# Patient Record
Sex: Male | Born: 1950
Health system: Southern US, Community
[De-identification: ages and names within clinical notes are randomized; demographics above are authoritative.]

## PROBLEM LIST (undated history)

## (undated) DIAGNOSIS — I82409 Acute embolism and thrombosis of unspecified deep veins of unspecified lower extremity: Secondary | ICD-10-CM

## (undated) DIAGNOSIS — F17200 Nicotine dependence, unspecified, uncomplicated: Secondary | ICD-10-CM

## (undated) DIAGNOSIS — I219 Acute myocardial infarction, unspecified: Secondary | ICD-10-CM

## (undated) DIAGNOSIS — I714 Abdominal aortic aneurysm, without rupture, unspecified: Secondary | ICD-10-CM

## (undated) DIAGNOSIS — I10 Essential (primary) hypertension: Secondary | ICD-10-CM

## (undated) DIAGNOSIS — I252 Old myocardial infarction: Secondary | ICD-10-CM

## (undated) DIAGNOSIS — E785 Hyperlipidemia, unspecified: Secondary | ICD-10-CM

## (undated) DIAGNOSIS — R0600 Dyspnea, unspecified: Secondary | ICD-10-CM

## (undated) DIAGNOSIS — M5136 Other intervertebral disc degeneration, lumbar region: Secondary | ICD-10-CM

## (undated) DIAGNOSIS — I2699 Other pulmonary embolism without acute cor pulmonale: Secondary | ICD-10-CM

## (undated) DIAGNOSIS — M51369 Other intervertebral disc degeneration, lumbar region without mention of lumbar back pain or lower extremity pain: Secondary | ICD-10-CM

## (undated) HISTORY — DX: Essential (primary) hypertension: I10

## (undated) HISTORY — DX: Acute embolism and thrombosis of unspecified deep veins of unspecified lower extremity: I82.409

## (undated) HISTORY — DX: Hyperlipidemia, unspecified: E78.5

## (undated) HISTORY — DX: Abdominal aortic aneurysm, without rupture, unspecified: I71.40

## (undated) HISTORY — DX: Other pulmonary embolism without acute cor pulmonale: I26.99

## (undated) HISTORY — PX: TONSILLECTOMY: SUR1361

## (undated) HISTORY — PX: CORONARY ANGIOPLASTY WITH STENT PLACEMENT: SHX49

---

## 2006-07-29 ENCOUNTER — Emergency Department (HOSPITAL_COMMUNITY): Admission: EM | Admit: 2006-07-29 | Discharge: 2006-07-29 | Payer: Self-pay | Admitting: Emergency Medicine

## 2006-09-13 ENCOUNTER — Ambulatory Visit (HOSPITAL_COMMUNITY): Admission: RE | Admit: 2006-09-13 | Discharge: 2006-09-13 | Payer: Self-pay | Admitting: Family Medicine

## 2006-09-20 ENCOUNTER — Encounter (HOSPITAL_COMMUNITY): Admission: RE | Admit: 2006-09-20 | Discharge: 2006-10-20 | Payer: Self-pay | Admitting: Family Medicine

## 2006-10-22 ENCOUNTER — Encounter (HOSPITAL_COMMUNITY): Admission: RE | Admit: 2006-10-22 | Discharge: 2006-11-23 | Payer: Self-pay | Admitting: Family Medicine

## 2008-07-13 ENCOUNTER — Emergency Department (HOSPITAL_COMMUNITY): Admission: EM | Admit: 2008-07-13 | Discharge: 2008-07-13 | Payer: Self-pay | Admitting: Emergency Medicine

## 2008-07-27 ENCOUNTER — Emergency Department (HOSPITAL_COMMUNITY): Admission: EM | Admit: 2008-07-27 | Discharge: 2008-07-27 | Payer: Self-pay | Admitting: Emergency Medicine

## 2009-07-12 ENCOUNTER — Inpatient Hospital Stay (HOSPITAL_COMMUNITY): Admission: EM | Admit: 2009-07-12 | Discharge: 2009-07-15 | Payer: Self-pay | Admitting: Emergency Medicine

## 2009-07-13 ENCOUNTER — Encounter (INDEPENDENT_AMBULATORY_CARE_PROVIDER_SITE_OTHER): Payer: Self-pay | Admitting: Cardiology

## 2009-08-09 ENCOUNTER — Encounter (HOSPITAL_COMMUNITY): Admission: RE | Admit: 2009-08-09 | Discharge: 2009-09-08 | Payer: Self-pay | Admitting: Cardiology

## 2009-09-24 ENCOUNTER — Ambulatory Visit (HOSPITAL_COMMUNITY): Admission: RE | Admit: 2009-09-24 | Discharge: 2009-09-24 | Payer: Self-pay | Admitting: Family Medicine

## 2010-02-10 ENCOUNTER — Emergency Department (HOSPITAL_COMMUNITY): Admission: EM | Admit: 2010-02-10 | Discharge: 2010-02-10 | Payer: Self-pay | Admitting: Emergency Medicine

## 2010-10-16 LAB — POCT I-STAT, CHEM 8
Calcium, Ion: 1.1 mmol/L — ABNORMAL LOW (ref 1.12–1.32)
Glucose, Bld: 112 mg/dL — ABNORMAL HIGH (ref 70–99)
HCT: 38 % — ABNORMAL LOW (ref 39.0–52.0)
TCO2: 24 mmol/L (ref 0–100)

## 2010-10-31 LAB — DIFFERENTIAL
Basophils Relative: 0 % (ref 0–1)
Eosinophils Absolute: 0.2 10*3/uL (ref 0.0–0.7)
Eosinophils Relative: 2 % (ref 0–5)
Lymphocytes Relative: 30 % (ref 12–46)
Lymphs Abs: 3 10*3/uL (ref 0.7–4.0)
Monocytes Absolute: 0.8 10*3/uL (ref 0.1–1.0)
Monocytes Relative: 8 % (ref 3–12)
Neutro Abs: 5.7 10*3/uL (ref 1.7–7.7)
Neutrophils Relative %: 59 % (ref 43–77)

## 2010-10-31 LAB — CBC: RDW: 15.8 % — ABNORMAL HIGH (ref 11.5–15.5)

## 2010-10-31 LAB — BASIC METABOLIC PANEL
BUN: 10 mg/dL (ref 6–23)
CO2: 27 mEq/L (ref 19–32)
Chloride: 109 mEq/L (ref 96–112)
Potassium: 4.1 mEq/L (ref 3.5–5.1)
Sodium: 142 mEq/L (ref 135–145)

## 2010-11-01 LAB — DIFFERENTIAL
Basophils Absolute: 0 10*3/uL (ref 0.0–0.1)
Basophils Absolute: 0 10*3/uL (ref 0.0–0.1)
Basophils Relative: 0 % (ref 0–1)
Basophils Relative: 1 % (ref 0–1)
Eosinophils Absolute: 0.1 10*3/uL (ref 0.0–0.7)
Eosinophils Absolute: 0.2 10*3/uL (ref 0.0–0.7)
Eosinophils Relative: 1 % (ref 0–5)
Lymphocytes Relative: 24 % (ref 12–46)
Lymphs Abs: 2.3 10*3/uL (ref 0.7–4.0)
Monocytes Absolute: 0.6 10*3/uL (ref 0.1–1.0)
Monocytes Absolute: 0.7 10*3/uL (ref 0.1–1.0)
Monocytes Relative: 4 % (ref 3–12)
Monocytes Relative: 5 % (ref 3–12)
Neutro Abs: 7.7 10*3/uL (ref 1.7–7.7)
Neutrophils Relative %: 64 % (ref 43–77)

## 2010-11-01 LAB — CBC
HCT: 40.4 % (ref 39.0–52.0)
Hemoglobin: 13.6 g/dL (ref 13.0–17.0)
MCHC: 33.6 g/dL (ref 30.0–36.0)
MCV: 90.9 fL (ref 78.0–100.0)
MCV: 90.9 fL (ref 78.0–100.0)
Platelets: 241 10*3/uL (ref 150–400)
RBC: 4.43 MIL/uL (ref 4.22–5.81)
RBC: 4.44 MIL/uL (ref 4.22–5.81)
RDW: 15.7 % — ABNORMAL HIGH (ref 11.5–15.5)
WBC: 11.4 10*3/uL — ABNORMAL HIGH (ref 4.0–10.5)
WBC: 17.5 10*3/uL — ABNORMAL HIGH (ref 4.0–10.5)

## 2010-11-01 LAB — CK TOTAL AND CKMB (NOT AT ARMC)
CK, MB: 2.6 ng/mL (ref 0.3–4.0)
CK, MB: 23.3 ng/mL — ABNORMAL HIGH (ref 0.3–4.0)
Relative Index: 8.8 — ABNORMAL HIGH (ref 0.0–2.5)
Total CK: 118 U/L (ref 7–232)
Total CK: 264 U/L — ABNORMAL HIGH (ref 7–232)

## 2010-11-01 LAB — POCT CARDIAC MARKERS
Myoglobin, poc: 209 ng/mL (ref 12–200)
Myoglobin, poc: 251 ng/mL (ref 12–200)
Troponin i, poc: 0.08 ng/mL (ref 0.00–0.09)
Troponin i, poc: 1.36 ng/mL (ref 0.00–0.09)

## 2010-11-01 LAB — BASIC METABOLIC PANEL
BUN: 13 mg/dL (ref 6–23)
CO2: 25 mEq/L (ref 19–32)
Calcium: 8.5 mg/dL (ref 8.4–10.5)
Chloride: 108 mEq/L (ref 96–112)
Creatinine, Ser: 1.07 mg/dL (ref 0.4–1.5)
GFR calc Af Amer: >60 mL/min (ref >60)
GFR calc non Af Amer: >60 mL/min (ref >60)
Glucose, Bld: 101 mg/dL — ABNORMAL HIGH (ref 70–99)
Glucose, Bld: 110 mg/dL — ABNORMAL HIGH (ref 70–99)

## 2010-11-01 LAB — POCT I-STAT, CHEM 8
Glucose, Bld: 142 mg/dL — ABNORMAL HIGH (ref 70–99)
Potassium: 3.6 mEq/L (ref 3.5–5.1)
Sodium: 141 mEq/L (ref 135–145)
TCO2: 25 mmol/L (ref 0–100)

## 2010-11-01 LAB — HEPARIN LEVEL (UNFRACTIONATED)
Heparin Unfractionated: 0.56 IU/mL (ref 0.30–0.70)
Heparin Unfractionated: 0.59 IU/mL (ref 0.30–0.70)

## 2010-11-01 LAB — TSH: TSH: 1.821 u[IU]/mL (ref 0.350–4.500)

## 2010-11-01 LAB — CARDIAC PANEL(CRET KIN+CKTOT+MB+TROPI)
CK, MB: 32.7 ng/mL — ABNORMAL HIGH (ref 0.3–4.0)
Relative Index: 8 — ABNORMAL HIGH (ref 0.0–2.5)

## 2010-11-01 LAB — LIPID PANEL
Cholesterol: 127 mg/dL (ref 0–200)
LDL Cholesterol: 72 mg/dL (ref 0–99)
Triglycerides: 98 mg/dL (ref <150)

## 2010-11-01 LAB — HEMOGLOBIN A1C: Hgb A1c MFr Bld: 5.7 % (ref 4.6–6.1)

## 2010-11-01 LAB — TROPONIN I: Troponin I: 1.45 ng/mL (ref 0.00–0.06)

## 2011-09-10 ENCOUNTER — Emergency Department (HOSPITAL_COMMUNITY)
Admission: EM | Admit: 2011-09-10 | Discharge: 2011-09-10 | Disposition: A | Discharge status: Home / Self Care | Payer: Medicaid Other | Attending: Emergency Medicine | Admitting: Emergency Medicine

## 2011-09-10 ENCOUNTER — Encounter (HOSPITAL_COMMUNITY): Payer: Self-pay

## 2011-09-10 ENCOUNTER — Emergency Department (HOSPITAL_COMMUNITY): Payer: Medicaid Other

## 2011-09-10 DIAGNOSIS — M47817 Spondylosis without myelopathy or radiculopathy, lumbosacral region: Secondary | ICD-10-CM | POA: Insufficient documentation

## 2011-09-10 DIAGNOSIS — I252 Old myocardial infarction: Secondary | ICD-10-CM | POA: Insufficient documentation

## 2011-09-10 DIAGNOSIS — M160 Bilateral primary osteoarthritis of hip: Secondary | ICD-10-CM

## 2011-09-10 DIAGNOSIS — M25559 Pain in unspecified hip: Secondary | ICD-10-CM | POA: Insufficient documentation

## 2011-09-10 DIAGNOSIS — M1610 Unilateral primary osteoarthritis, unspecified hip: Secondary | ICD-10-CM | POA: Insufficient documentation

## 2011-09-10 DIAGNOSIS — F17200 Nicotine dependence, unspecified, uncomplicated: Secondary | ICD-10-CM | POA: Insufficient documentation

## 2011-09-10 DIAGNOSIS — M169 Osteoarthritis of hip, unspecified: Secondary | ICD-10-CM | POA: Insufficient documentation

## 2011-09-10 HISTORY — DX: Acute myocardial infarction, unspecified: I21.9

## 2011-09-10 MED ORDER — OXYCODONE-ACETAMINOPHEN 5-325 MG PO TABS
1.0000 | ORAL_TABLET | Freq: Once | ORAL | Status: AC
  Administered 2011-09-10: 1 via ORAL
  Filled 2011-09-10: qty 1

## 2011-09-10 MED ORDER — OXYCODONE HCL 5 MG PO TABS: 5.0000 mg | ORAL_TABLET | Freq: Once | ORAL | Status: DC

## 2011-09-10 MED ORDER — OXYCODONE-ACETAMINOPHEN 5-325 MG PO TABS: 1.0000 | ORAL_TABLET | ORAL | Status: AC | PRN

## 2011-09-10 NOTE — ED Notes (Signed)
Pt presents with left sided hip pain since Thursday. Pt denies injury.

## 2011-09-10 NOTE — ED Notes (Signed)
Pt states his left hip just started hurting like it has in the past. Pt denies injury.

## 2011-09-11 NOTE — ED Provider Notes (Signed)
History     CSN: [id number]  Arrival date & time 09/10/11  1351   First MD Initiated Contact with Patient 09/10/11 1515      Chief Complaint  Patient presents with  . Hip Pain    (Consider location/radiation/quality/duration/timing/severity/associated sxs/prior treatment) Patient is a 61 y.o. male presenting with hip pain. The history is provided by the patient.  Hip Pain This is a recurrent (Patient states he has a known history of arthritis including his lumbar spine and both of his hips. He denies injury) problem. The current episode started in the past 7 days. The problem occurs constantly. The problem has been unchanged. Associated symptoms include arthralgias. Pertinent negatives include no abdominal pain, anorexia, change in bowel habit, chest pain, chills, congestion, fever, headaches, joint swelling, myalgias, nausea, neck pain, numbness, rash, sore throat or weakness. The symptoms are aggravated by walking and standing. He has tried nothing for the symptoms.    Past Medical History  Diagnosis Date  . MI (myocardial infarction)   . Arthritis     History reviewed. No pertinent past surgical history.  No family history on file.  History  Substance Use Topics  . Smoking status: Current Some Day Smoker  . Smokeless tobacco: Not on file  . Alcohol Use: No      Review of Systems  Constitutional: Negative for fever and chills.  HENT: Negative for congestion, sore throat and neck pain.   Eyes: Negative.   Respiratory: Negative for chest tightness and shortness of breath.   Cardiovascular: Negative for chest pain.  Gastrointestinal: Negative for nausea, abdominal pain, anorexia and change in bowel habit.  Genitourinary: Negative.   Musculoskeletal: Positive for arthralgias and gait problem. Negative for myalgias, back pain and joint swelling.  Skin: Negative.  Negative for rash and wound.  Neurological: Negative for dizziness, weakness, light-headedness, numbness and  headaches.  Hematological: Negative.   Psychiatric/Behavioral: Negative.     Allergies  Review of patient's allergies indicates no known allergies.  Home Medications   Current Outpatient Rx  Name Route Sig Dispense Refill  . ASPIRIN 325 MG PO TBEC Oral Take 325 mg by mouth daily.    Marland Kitchen METOPROLOL TARTRATE 25 MG PO TABS Oral Take 25 mg by mouth 2 (two) times daily.    Marland Kitchen NITROGLYCERIN 0.4 MG SL SUBL Sublingual Place 0.4 mg under the tongue every 5 (five) minutes as needed. For chest pains    . QUINAPRIL HCL 10 MG PO TABS Oral Take 10 mg by mouth daily.    Marland Kitchen SIMVASTATIN 20 MG PO TABS Oral Take 20 mg by mouth every evening.    . OXYCODONE-ACETAMINOPHEN 5-325 MG PO TABS Oral Take 1 tablet by mouth every 4 (four) hours as needed for pain. 20 tablet 0    BP 164/91  Pulse 76  Temp(Src) 93.6 F (34.2 C) (Oral)  Resp 20  Ht 5\' 8"  (1.727 m)  Wt 145 lb (65.772 kg)  BMI 22.05 kg/m2  SpO2 97%  Physical Exam  Nursing note and vitals reviewed. Constitutional: He is oriented to person, place, and time. He appears well-developed and well-nourished.  HENT:  Head: Normocephalic and atraumatic.  Eyes: Conjunctivae are normal.  Neck: Normal range of motion.  Cardiovascular: Normal rate, regular rhythm, normal heart sounds and intact distal pulses.   Pulmonary/Chest: Effort normal and breath sounds normal. He has no wheezes.  Abdominal: Soft. Bowel sounds are normal. There is no tenderness.  Musculoskeletal: Normal range of motion. He exhibits no edema and  no tenderness.       Left hip: He exhibits tenderness. He exhibits normal range of motion, no bony tenderness, no crepitus and no deformity.       Increased pain with external rotation of left hip.  Neurological: He is alert and oriented to person, place, and time.  Skin: Skin is warm and dry.  Psychiatric: He has a normal mood and affect.    ED Course  Procedures (including critical care time)  Labs Reviewed - No data to display Dg Hip  Complete Left  09/10/2011  *RADIOLOGY REPORT*  Clinical Data: Left hip pain, no known injury  LEFT HIP - COMPLETE 2+ VIEW  Comparison: None.  Findings: No fracture or dislocation is seen.  Mild symmetric narrowing of the bilateral hip joint spaces.  IMPRESSION: No fracture or dislocation is seen.  Mild degenerative changes of the bilateral hips.  Original Report Authenticated By: Charline Bills, M.D.     1. Hip pain   2. Osteoarthritis of both hips       MDM  Heat therapy recommended, oxycodone prescribed encouraged followup with PCP for further management of his chronic intermittent pain.        Candis Musa, PA 09/11/11 [phone]

## 2011-09-13 NOTE — ED Provider Notes (Signed)
Medical screening examination/treatment/procedure(s) were performed by non-physician practitioner and as supervising physician I was immediately available for consultation/collaboration.   Saquan Furtick, MD 09/13/11 0733 

## 2011-12-13 DIAGNOSIS — R109 Unspecified abdominal pain: Secondary | ICD-10-CM | POA: Insufficient documentation

## 2011-12-13 DIAGNOSIS — I252 Old myocardial infarction: Secondary | ICD-10-CM | POA: Insufficient documentation

## 2011-12-13 DIAGNOSIS — F17200 Nicotine dependence, unspecified, uncomplicated: Secondary | ICD-10-CM | POA: Insufficient documentation

## 2011-12-13 DIAGNOSIS — M549 Dorsalgia, unspecified: Secondary | ICD-10-CM | POA: Insufficient documentation
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
## 2011-12-14 ENCOUNTER — Emergency Department (HOSPITAL_COMMUNITY)
Admission: EM | Admit: 2011-12-14 | Discharge: 2011-12-14 | Disposition: A | Discharge status: Home / Self Care | Payer: Medicaid Other | Attending: Emergency Medicine | Admitting: Emergency Medicine

## 2011-12-14 ENCOUNTER — Encounter (HOSPITAL_COMMUNITY): Payer: Self-pay | Admitting: *Deleted

## 2011-12-14 MED ORDER — HYDROCODONE-ACETAMINOPHEN 5-325 MG PO TABS
1.0000 | ORAL_TABLET | Freq: Once | ORAL | Status: AC
  Administered 2011-12-14: 1 via ORAL
  Filled 2011-12-14: qty 1

## 2011-12-14 MED ORDER — IBUPROFEN 800 MG PO TABS
800.0000 mg | ORAL_TABLET | Freq: Once | ORAL | Status: AC
  Administered 2011-12-14: 800 mg via ORAL
  Filled 2011-12-14: qty 1

## 2011-12-14 NOTE — ED Notes (Addendum)
When giving patient medication he states "Ibuprofen doesn't do anything for me" advised that the MD had also given him generic Vicodin and the two medications should help his pain.

## 2011-12-14 NOTE — ED Notes (Signed)
Requesting pain medication
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
## 2011-12-15 ENCOUNTER — Emergency Department (HOSPITAL_COMMUNITY): Payer: Medicaid Other

## 2011-12-15 ENCOUNTER — Emergency Department (HOSPITAL_COMMUNITY)
Admission: EM | Admit: 2011-12-15 | Discharge: 2011-12-15 | Disposition: A | Discharge status: Home / Self Care | Payer: Medicaid Other | Attending: Emergency Medicine | Admitting: Emergency Medicine

## 2011-12-15 ENCOUNTER — Encounter (HOSPITAL_COMMUNITY): Payer: Self-pay | Admitting: *Deleted

## 2011-12-15 DIAGNOSIS — I723 Aneurysm of iliac artery: Secondary | ICD-10-CM | POA: Insufficient documentation

## 2011-12-15 DIAGNOSIS — M545 Low back pain, unspecified: Secondary | ICD-10-CM | POA: Insufficient documentation

## 2011-12-15 DIAGNOSIS — S20222D Contusion of left back wall of thorax, subsequent encounter: Secondary | ICD-10-CM

## 2011-12-15 DIAGNOSIS — R296 Repeated falls: Secondary | ICD-10-CM | POA: Insufficient documentation

## 2011-12-15 DIAGNOSIS — Z7982 Long term (current) use of aspirin: Secondary | ICD-10-CM | POA: Insufficient documentation

## 2011-12-15 DIAGNOSIS — I252 Old myocardial infarction: Secondary | ICD-10-CM | POA: Insufficient documentation

## 2011-12-15 DIAGNOSIS — M129 Arthropathy, unspecified: Secondary | ICD-10-CM | POA: Insufficient documentation

## 2011-12-15 DIAGNOSIS — Z79899 Other long term (current) drug therapy: Secondary | ICD-10-CM | POA: Insufficient documentation

## 2011-12-15 DIAGNOSIS — IMO0002 Reserved for concepts with insufficient information to code with codable children: Secondary | ICD-10-CM | POA: Insufficient documentation

## 2011-12-15 DIAGNOSIS — S20229A Contusion of unspecified back wall of thorax, initial encounter: Secondary | ICD-10-CM | POA: Insufficient documentation

## 2011-12-15 HISTORY — DX: Other intervertebral disc degeneration, lumbar region: M51.36

## 2011-12-15 HISTORY — DX: Other intervertebral disc degeneration, lumbar region without mention of lumbar back pain or lower extremity pain: M51.369

## 2011-12-15 LAB — POCT I-STAT, CHEM 8
BUN: 10 mg/dL (ref 6–23)
Chloride: 104 mEq/L (ref 96–112)
Sodium: 145 mEq/L (ref 135–145)
TCO2: 28 mmol/L (ref 0–100)

## 2011-12-15 MED ORDER — IOHEXOL 300 MG/ML  SOLN
100.0000 mL | Freq: Once | INTRAMUSCULAR | Status: AC | PRN
  Administered 2011-12-15: 100 mL via INTRAVENOUS

## 2011-12-15 MED ORDER — SODIUM CHLORIDE 0.9 % IV SOLN
INTRAVENOUS | Status: DC
  Administered 2011-12-15: 21:00:00 via INTRAVENOUS

## 2011-12-15 MED ORDER — MORPHINE SULFATE 4 MG/ML IJ SOLN
4.0000 mg | INTRAMUSCULAR | Status: DC | PRN
  Administered 2011-12-15: 4 mg via INTRAVENOUS
  Filled 2011-12-15: qty 1

## 2011-12-15 MED ORDER — OXYCODONE-ACETAMINOPHEN 5-325 MG PO TABS: ORAL_TABLET | ORAL | Status: AC
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
## 2011-12-15 NOTE — Discharge Instructions (Signed)
RESOURCE GUIDE  Dental Problems  Patients with Medicaid: [facility]                     [facility] [address]                                           [address] Phone:  [phone]                                                  Phone:  [phone]  If unable to pay or uninsured, contact:  [facility] or [facility] to become qualified for the adult dental clinic.  Chronic Pain Problems Contact [facility]  [phone] Patients need to be referred by their primary care doctor.  Insufficient Money for Medicine Contact [employer]:  call "211" or [facility] 271-5999.  No Primary Care Doctor Call [facility]  [phone] Other agencies that provide inexpensive medical care    [facility]  [phone]    [facility]  [phone]    [facility]  [phone]    Women's Clinic  [phone]    [facility]  [phone]    [facility]  [phone]  Psychological Services [facility]  [phone] [facility]  [phone] [facility]   [phone] (emergency services 641-4993)  Substance Abuse Resources [facility]  336-882-2125 [facility] 336-784-9470 [facility] 336-285-9073 [facility] 336-845-3988 Residential & Outpatient Substance Abuse Program  800-659-3381  Abuse/Neglect [place] Child Abuse Hotline (336) 641-3795 [place] Child Abuse Hotline 800-378-5315 (After Hours)  Emergency Shelter [facility] (336) 271-5985  Maternity Homes [facility] (336) 275-9566 [facility] (704) 372-4663  MRSA Hotline #:   832-7006    [place] [facility]  [facility] [place]     [employer]                          [facility] [address] [place]                       [address]      [address]  [place]                                                [place]                            [place] Phone:  [phone]                                   Phone:  [phone]                 Phone:  [phone]  [facility] Phone:  [phone]    [facility] Child Abuse Hotline 412 704 0065 514-785-9616 (After Hours)    Take the prescription as directed.  Apply moist heat or ice to the area(s) of discomfort, for 15 minutes at a time, several times per day for the next few days.  Do not fall asleep on a heating or ice pack.  Call your regular medical doctor on Monday to schedule a follow up appointment this week.  Your CT scan showed an incidental finding of:  "Atherosclerotic disease of the infrarenal aorta and mild aneurysmal dilatation of the left common iliac artery up to 1.6 cm."  This is not an acute surgical issue at this time, but needs to be followed up as an outpatient.  Call the Vascular Surgeon's office on Monday to schedule a follow up appointment within the next 1 month.
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
## 2011-12-15 NOTE — ED Notes (Signed)
Pt alert & oriented x4, stable gait. Pt given discharge instructions, paperwork & prescription(s). Patient instructed to stop at the registration desk to finish any additional paperwork. pt verbalized understanding. Pt left department w/ no further questions.  

## 2012-01-03 ENCOUNTER — Other Ambulatory Visit: Payer: Self-pay

## 2012-01-03 DIAGNOSIS — I723 Aneurysm of iliac artery: Secondary | ICD-10-CM

## 2012-02-20 ENCOUNTER — Encounter: Payer: Self-pay | Admitting: Vascular Surgery

## 2012-02-21 ENCOUNTER — Encounter: Payer: Self-pay | Admitting: Vascular Surgery

## 2012-02-21 ENCOUNTER — Other Ambulatory Visit (INDEPENDENT_AMBULATORY_CARE_PROVIDER_SITE_OTHER): Payer: Medicaid Other | Admitting: *Deleted

## 2012-02-21 ENCOUNTER — Ambulatory Visit (INDEPENDENT_AMBULATORY_CARE_PROVIDER_SITE_OTHER): Payer: Medicaid Other | Admitting: Vascular Surgery

## 2012-02-21 VITALS — BP 188/80 | HR 57 | Temp 98.0°F | Ht 68.0 in | Wt 147.0 lb

## 2012-02-21 DIAGNOSIS — I723 Aneurysm of iliac artery: Secondary | ICD-10-CM | POA: Insufficient documentation

## 2012-02-21 NOTE — Assessment & Plan Note (Signed)
This patient has a small 1.6 cm left common iliac artery aneurysm. This was 1.6 cm by CT scan and also by ultrasound. He is thin so I think it will be reasonable to follow this with ultrasound and he should not require CT scan unless it enlarges enough to consider repair. I've explained that we would typically consider elective repair in a normal risk patient if it reached 3.5 cm in maximum diameter. He is a smoker and I have encouraged him to quit as this does increase his risk of aneurysm expansion. I've ordered a follow up ultrasound in one year and we will see him back at that time. He knows to call sooner if he has problems.
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
## 2012-02-28 NOTE — Procedures (Unsigned)
DUPLEX ULTRASOUND OF ABDOMINAL AORTA  INDICATION:  Left common iliac artery aneurysm.  HISTORY: Diabetes:  No Cardiac:  MI Hypertension:  No Smoking:  Yes Connective Tissue Disorder: Family History:  No Previous Surgery:  No  DUPLEX EXAM:         AP (cm)                   TRANSVERSE (cm) Proximal             2.3 cm                    2.2 cm Mid                  2.1 cm                    2.0 cm Distal               2.0 cm                    2.0 cm Right Iliac          1.4 cm                    1.3 cm Left Iliac           1.6 cm                    1.5 cm  PREVIOUS:  Date: 12/15/2011 (CT)  AP:  Left CIA = 1.6  TRANSVERSE:  IMPRESSION: 1. No evidence of aneurysmal dilatation noted in the abdominal aorta. 2. No significant change in maximal diameter of the small left common     iliac artery aneurysm when compared to the previous CT. 3. No increased velocity is noted in the abdominal aorta and bilateral     proximal common iliac arteries.  ___________________________________________ Di Kindle. Edilia Bo, M.D.  CH/MEDQ  D:  02/23/2012  T:  02/23/2012  Job:  [id number]

## 2012-09-24 ENCOUNTER — Inpatient Hospital Stay (HOSPITAL_COMMUNITY)
Admission: EM | Admit: 2012-09-24 | Discharge: 2012-09-26 | DRG: 249 | Disposition: A | Discharge status: Home / Self Care | Payer: Medicaid Other | Attending: Cardiology | Admitting: Cardiology

## 2012-09-24 ENCOUNTER — Emergency Department (HOSPITAL_COMMUNITY): Payer: Medicaid Other

## 2012-09-24 ENCOUNTER — Other Ambulatory Visit: Payer: Self-pay

## 2012-09-24 ENCOUNTER — Encounter (HOSPITAL_COMMUNITY)
Admission: EM | Disposition: A | Discharge status: Home / Self Care | Payer: Self-pay | Source: Home / Self Care | Attending: Cardiology

## 2012-09-24 ENCOUNTER — Encounter (HOSPITAL_COMMUNITY): Payer: Self-pay

## 2012-09-24 ENCOUNTER — Ambulatory Visit (HOSPITAL_COMMUNITY): Admit: 2012-09-24 | Payer: Self-pay | Admitting: Interventional Cardiology

## 2012-09-24 DIAGNOSIS — I10 Essential (primary) hypertension: Secondary | ICD-10-CM | POA: Diagnosis present

## 2012-09-24 DIAGNOSIS — F17200 Nicotine dependence, unspecified, uncomplicated: Secondary | ICD-10-CM | POA: Diagnosis present

## 2012-09-24 DIAGNOSIS — Z9861 Coronary angioplasty status: Secondary | ICD-10-CM

## 2012-09-24 DIAGNOSIS — I2109 ST elevation (STEMI) myocardial infarction involving other coronary artery of anterior wall: Principal | ICD-10-CM | POA: Diagnosis present

## 2012-09-24 DIAGNOSIS — Z7982 Long term (current) use of aspirin: Secondary | ICD-10-CM

## 2012-09-24 DIAGNOSIS — E782 Mixed hyperlipidemia: Secondary | ICD-10-CM

## 2012-09-24 DIAGNOSIS — Z955 Presence of coronary angioplasty implant and graft: Secondary | ICD-10-CM

## 2012-09-24 DIAGNOSIS — I2510 Atherosclerotic heart disease of native coronary artery without angina pectoris: Secondary | ICD-10-CM | POA: Diagnosis present

## 2012-09-24 DIAGNOSIS — I723 Aneurysm of iliac artery: Secondary | ICD-10-CM

## 2012-09-24 DIAGNOSIS — E785 Hyperlipidemia, unspecified: Secondary | ICD-10-CM | POA: Diagnosis present

## 2012-09-24 DIAGNOSIS — Z79899 Other long term (current) drug therapy: Secondary | ICD-10-CM

## 2012-09-24 DIAGNOSIS — I252 Old myocardial infarction: Secondary | ICD-10-CM | POA: Insufficient documentation

## 2012-09-24 HISTORY — PX: LEFT HEART CATHETERIZATION WITH CORONARY ANGIOGRAM: SHX5451

## 2012-09-24 HISTORY — DX: Nicotine dependence, unspecified, uncomplicated: F17.200

## 2012-09-24 HISTORY — DX: Old myocardial infarction: I25.2

## 2012-09-24 LAB — POCT I-STAT, CHEM 8
BUN: 16 mg/dL (ref 6–23)
Calcium, Ion: 1.19 mmol/L (ref 1.13–1.30)
Chloride: 110 mEq/L (ref 96–112)
HCT: 49 % (ref 39.0–52.0)
Sodium: 143 mEq/L (ref 135–145)
TCO2: 24 mmol/L (ref 0–100)

## 2012-09-24 LAB — POCT I-STAT TROPONIN I: Troponin i, poc: 5.66 ng/mL (ref 0.00–0.08)

## 2012-09-24 LAB — CBC
MCH: 28.8 pg (ref 26.0–34.0)
MCHC: 33.5 g/dL (ref 30.0–36.0)
Platelets: 281 10*3/uL (ref 150–400)
RDW: 14.7 % (ref 11.5–15.5)

## 2012-09-24 LAB — COMPREHENSIVE METABOLIC PANEL
ALT: 28 U/L (ref 0–53)
Albumin: 3.6 g/dL (ref 3.5–5.2)
Alkaline Phosphatase: 97 U/L (ref 39–117)
Calcium: 9.8 mg/dL (ref 8.4–10.5)
GFR calc Af Amer: 65 mL/min — ABNORMAL LOW (ref >90)
Potassium: 4.1 mEq/L (ref 3.5–5.1)
Sodium: 141 mEq/L (ref 135–145)
Total Protein: 8.7 g/dL — ABNORMAL HIGH (ref 6.0–8.3)

## 2012-09-24 LAB — POCT ACTIVATED CLOTTING TIME: Activated Clotting Time: 453 seconds

## 2012-09-24 LAB — CK TOTAL AND CKMB (NOT AT ARMC): Relative Index: 4.4 — ABNORMAL HIGH (ref 0.0–2.5)

## 2012-09-24 LAB — APTT: aPTT: 33 seconds (ref 24–37)

## 2012-09-24 LAB — MRSA PCR SCREENING: MRSA by PCR: NEGATIVE

## 2012-09-24 SURGERY — LEFT HEART CATHETERIZATION WITH CORONARY ANGIOGRAM
Anesthesia: LOCAL

## 2012-09-24 MED ORDER — VERAPAMIL HCL 2.5 MG/ML IV SOLN
10.0000 mg | Freq: Once | INTRAVENOUS | Status: DC
  Filled 2012-09-24: qty 4

## 2012-09-24 MED ORDER — NITROGLYCERIN 0.4 MG SL SUBL
0.4000 mg | SUBLINGUAL_TABLET | SUBLINGUAL | Status: DC | PRN
Start: 2012-09-24 — End: 2012-09-26

## 2012-09-24 MED ORDER — NITROGLYCERIN 1 MG/10 ML FOR IR/CATH LAB
INTRA_ARTERIAL | Status: AC
  Filled 2012-09-24: qty 10

## 2012-09-24 MED ORDER — ASPIRIN 81 MG PO CHEW: 324.0000 mg | CHEWABLE_TABLET | Freq: Once | ORAL | Status: DC

## 2012-09-24 MED ORDER — MIDAZOLAM HCL 2 MG/2ML IJ SOLN
INTRAMUSCULAR | Status: AC
  Filled 2012-09-24: qty 2

## 2012-09-24 MED ORDER — HEPARIN (PORCINE) IN NACL 2-0.9 UNIT/ML-% IJ SOLN
INTRAMUSCULAR | Status: AC
  Filled 2012-09-24: qty 1000

## 2012-09-24 MED ORDER — SODIUM CHLORIDE 0.9 % IV SOLN
0.2500 mg/kg/h | INTRAVENOUS | Status: AC
  Filled 2012-09-24: qty 250

## 2012-09-24 MED ORDER — HEPARIN (PORCINE) IN NACL 100-0.45 UNIT/ML-% IJ SOLN
INTRAMUSCULAR | Status: AC
  Administered 2012-09-24: 1000 [IU]
  Filled 2012-09-24: qty 250

## 2012-09-24 MED ORDER — CARVEDILOL 3.125 MG PO TABS
3.1250 mg | ORAL_TABLET | Freq: Two times a day (BID) | ORAL | Status: DC
  Administered 2012-09-24 – 2012-09-25 (×3): 3.125 mg via ORAL
  Filled 2012-09-24 (×6): qty 1

## 2012-09-24 MED ORDER — PRASUGREL HCL 10 MG PO TABS
10.0000 mg | ORAL_TABLET | Freq: Every day | ORAL | Status: DC
  Administered 2012-09-24 – 2012-09-26 (×3): 10 mg via ORAL
  Filled 2012-09-24 (×3): qty 1

## 2012-09-24 MED ORDER — SIMVASTATIN 20 MG PO TABS
20.0000 mg | ORAL_TABLET | Freq: Every day | ORAL | Status: DC
  Administered 2012-09-24 – 2012-09-25 (×2): 20 mg via ORAL
  Filled 2012-09-24 (×3): qty 1

## 2012-09-24 MED ORDER — LIDOCAINE HCL (PF) 1 % IJ SOLN
INTRAMUSCULAR | Status: AC
  Filled 2012-09-24: qty 30

## 2012-09-24 MED ORDER — METOPROLOL TARTRATE 1 MG/ML IV SOLN
INTRAVENOUS | Status: AC
  Administered 2012-09-24: 5 mg
  Filled 2012-09-24: qty 5

## 2012-09-24 MED ORDER — ONDANSETRON HCL 4 MG/2ML IJ SOLN: 4.0000 mg | Freq: Four times a day (QID) | INTRAMUSCULAR | Status: DC | PRN

## 2012-09-24 MED ORDER — HEPARIN SODIUM (PORCINE) 5000 UNIT/ML IJ SOLN: 4000.0000 [IU] | INTRAMUSCULAR | Status: AC

## 2012-09-24 MED ORDER — LISINOPRIL 10 MG PO TABS
10.0000 mg | ORAL_TABLET | Freq: Every day | ORAL | Status: DC
  Administered 2012-09-24 – 2012-09-26 (×3): 10 mg via ORAL
  Filled 2012-09-24 (×3): qty 1

## 2012-09-24 MED ORDER — SODIUM CHLORIDE 0.9 % IV SOLN
INTRAVENOUS | Status: DC
  Administered 2012-09-24: 15:00:00 via INTRAVENOUS

## 2012-09-24 MED ORDER — BIVALIRUDIN 250 MG IV SOLR
INTRAVENOUS | Status: AC
  Filled 2012-09-24: qty 250

## 2012-09-24 MED ORDER — TICAGRELOR 90 MG PO TABS
ORAL_TABLET | ORAL | Status: AC
  Filled 2012-09-24: qty 2

## 2012-09-24 MED ORDER — SODIUM CHLORIDE 0.9 % IV SOLN
1.0000 mL/kg/h | INTRAVENOUS | Status: AC
  Administered 2012-09-24: 1 mL/kg/h via INTRAVENOUS

## 2012-09-24 MED ORDER — OXYCODONE-ACETAMINOPHEN 5-325 MG PO TABS
1.0000 | ORAL_TABLET | Freq: Four times a day (QID) | ORAL | Status: DC | PRN
  Administered 2012-09-24: 1 via ORAL
  Filled 2012-09-24: qty 1

## 2012-09-24 MED ORDER — VERAPAMIL HCL 2.5 MG/ML IV SOLN
INTRAVENOUS | Status: AC
  Filled 2012-09-24: qty 2

## 2012-09-24 MED ORDER — ACETAMINOPHEN 325 MG PO TABS: 650.0000 mg | ORAL_TABLET | ORAL | Status: DC | PRN

## 2012-09-24 MED ORDER — ASPIRIN EC 325 MG PO TBEC
325.0000 mg | DELAYED_RELEASE_TABLET | Freq: Every day | ORAL | Status: DC
  Administered 2012-09-25 – 2012-09-26 (×2): 325 mg via ORAL
  Filled 2012-09-24 (×2): qty 1

## 2012-09-24 MED ORDER — NITROGLYCERIN IN D5W 200-5 MCG/ML-% IV SOLN
INTRAVENOUS | Status: AC
  Administered 2012-09-24: 5 ug
  Filled 2012-09-24: qty 250

## 2012-09-24 MED ORDER — SODIUM CHLORIDE 0.9 % IV SOLN
0.2500 mg/kg/h | INTRAVENOUS | Status: DC
  Filled 2012-09-24: qty 250

## 2012-09-24 MED ORDER — FENTANYL CITRATE 0.05 MG/ML IJ SOLN
INTRAMUSCULAR | Status: AC
  Filled 2012-09-24: qty 2

## 2012-09-24 NOTE — Progress Notes (Signed)
CRITICAL VALUE ALERT  Critical value received:  CKMB 29.9  Date of notification:  09/24/2012  Time of notification:  0800  Critical value read back: Yes  Nurse who received alert:  [place] Antoninette Lerner RN  MD notified (1st page):  Dr. Mayford Knife  Time of first page:  0800  Responding MD: Dr. Mayford Knife  Time MD responded:  [phone]

## 2012-09-24 NOTE — ED Notes (Signed)
Report called to [facility] in [facility] cath lab.

## 2012-09-24 NOTE — CV Procedure (Signed)
PROCEDURE:  Left heart catheterization with selective coronary angiography, PCI LAD.  INDICATIONS:  Anterior ST elevation MI  The risks, benefits, and details of the procedure were explained to the patient.  The patient verbalized understanding and wanted to proceed.  Informed written consent was obtained.  PROCEDURE TECHNIQUE:  After Xylocaine anesthesia a 43F sheath was placed in the right radial artery with a single anterior needle wall stick.   Left coronary angiography was done using a CLS 3.0 guide catheter.  Right coronary angiography was done using a Judkins R4 guide catheter.  Left heart catheterization was done using a pigtail catheter.  TR band was used for hemostasis.   CONTRAST:  Total of 95 cc.  COMPLICATIONS:  None.    HEMODYNAMICS:  Aortic pressure was 154/72; LV pressure was 158/4; LVEDP 15.  There was no gradient between the left ventricle and aorta.    ANGIOGRAPHIC DATA:   The left main coronary artery is widely patent.  The left anterior descending artery is a large vessel which reaches the apex.  In the mid vessel, there is a subtotal occlusion with a large thrombus burden, and TIMI 1 flow.  There appears to be a segment which may be intramyocardial past the subtotal occlusion.  There is several medium-sized diagonals which are widely patent but have some mild ostial disease.  The left circumflex artery is a large vessel.  There is mild atherosclerosis in the midportion of this vessel.  The OM1 is a medium-size vessel and widely patent.  The OM 2 and 3 are medium-sized and widely patent.  The right coronary artery is a large dominant vessel.  The mid vessel stent is widely patent.  The posterior descending artery is a medium-sized vessel and appears widely patent.  The posterior lateral artery is a large vessel .  LEFT VENTRICULOGRAM:  Left ventricular angiogram was not done to avoid excessive contrast administration.  PCI NARRATIVE: A CLS 3.0 guiding catheter was used to  engage the left main.  Angiomax was used for anticoagulation.  An ACT was used to check that the Angiomax is therapeutic.  A pro-water wire was placed across the area of disease.  An expressway aspiration catheter was used with success to remove thrombus an improved flow.  Intracoronary nitroglycerin was used as there appeared to be some spasm in the distal vessel.  It was unclear whether this was myocardial bridging or vasospasm.  A 2.5 x 15 balloon was used to predilate the lesion.  A 3.0 x 20  Veriflex stent was used for the diseased area.  This was deployed at 18 atmospheres.  The stent was postdilated with a 3.75 x 15 McLean apex balloon.  There is an excellent angiographic result.  There is no residual stenosis.  IMPRESSIONS:  1. Normal left main coronary artery. 2. Subtotally occluded mid left anterior descending artery which was responsible for the patient's presentation of anterior STEMI.  This was successfully treated with a bare metal stent, 3.0 x 20, postdilated to 3.75 mm in diameter. 3. Mild atherosclerosis in the left circumflex artery and its branches. 4. Widely patent stent in the mid right coronary artery. 5. LVEDP 15 mmHg.  Ejection fraction not assessed.  RECOMMENDATION:  He'll be watched in the ICU.  Continue dual antiplatelet therapy for at least 30 days but preferably longer.  He'll need aggressive secondary prevention including tobacco cessation.  Start nonselective beta blocker.  Check lipids in the morning.  Continue statin.  Continue ACE inhibitor.  Check  echocardiogram.

## 2012-09-24 NOTE — ED Notes (Signed)
[facility] on site for transfer.

## 2012-09-24 NOTE — ED Notes (Signed)
Pt c/o chest tightness, sob and aching in left arm since yesterday.  Reports pain decreased with nitro.  Reports took a total of 3 or nitro yesterday and took 325mg  aspirin this morning.

## 2012-09-24 NOTE — H&P (Signed)
Admit date: 09/24/2012 Referring Physician Dr. Adriana Simas Primary Cardiologist Dr. Anne Fu Chief complaint/reason for admission: chest pain/ anterior MI  HPI: 62 Young who has had a prior inferior MI.  He had a bare metal stent placed in his RCA.  He has been having chest discomfort intermittently over the past 5 days.  It was most prolonged yesterday.  He went to the ER and was noted to have an abnormal ECG with anterior ST elevation.  He was transferred emergently to [facility] cath lab.  Upon arrival to the cath lab, his chest pain is gone.  However, given his recent symptoms, we will pursue cardiac cath.  He was hemodynamically stable in route.  He continues to smoke cigarettes but is hoping to stop.  He denies any shortness of breath.  No trouble lying flat.  No leg swelling.     PMH:    Past Medical History  Diagnosis Date  . MI (myocardial infarction)   . DDD (degenerative disc disease), lumbar   . Hypertension   . Hyperlipidemia     PSH:    Past Surgical History  Procedure Laterality Date  . Tonsillectomy    . Coronary angioplasty with stent placement      ALLERGIES:   Review of patient's allergies indicates no known allergies.  Prior to Admit Meds:   Prescriptions prior to admission  Medication Sig Dispense Refill  . aspirin 325 MG EC tablet Take 325 mg by mouth daily.      . nitroGLYCERIN (NITROSTAT) 0.4 MG SL tablet Place 0.4 mg under the tongue every 5 (five) minutes as needed. For chest pains      . quinapril (ACCUPRIL) 10 MG tablet Take 10 mg by mouth daily.      . simvastatin (ZOCOR) 20 MG tablet Take 20 mg by mouth every evening.       Family HX:    Family History  Problem Relation Age of Onset  . Cancer Mother    Social HX:    History   Social History  . Marital Status: Single    Spouse Name: N/A    Number of Children: N/A  . Years of Education: N/A   Occupational History  . Not on file.   Social History Main Topics  . Smoking status: Current Some Day Smoker  -- 0.10 packs/day for 44 years    Types: Cigarettes  . Smokeless tobacco: Never Used  . Alcohol Use: No  . Drug Use: Yes     Comment: cocaine use in the past, pt states it has been about 2 years since he has used/  denies drug use.  Marland Kitchen Sexually Active: Not on file   Other Topics Concern  . Not on file   Social History Narrative  . No narrative on file     ROS:  All 11 ROS were addressed and are negative except what is stated in the HPI  PHYSICAL EXAM Filed Vitals:   09/24/12 1440  BP: 164/103  Pulse:   Resp: 16   General: Well developed, well nourished, in no acute distress Head:  Normal cephalic and atramatic  Lungs:   Clear bilaterally to auscultation and percussion. Heart:   HRRR S1 S2  Abdomen:  abdomen soft and non-tender Msk:  . Normal strength and tone for age. Extremities:   No  edema.  2+ right radial pulse Neuro: Alert and oriented X 3. Psych:  Normal affect, responds appropriately   Labs:   Lab Results  Component Value  Date   WBC 13.0* 09/24/2012   HGB 16.7 09/24/2012   HCT 49.0 09/24/2012   MCV 85.7 09/24/2012   PLT 281 09/24/2012    Recent Labs Lab 09/24/12 1421 09/24/12 1447  NA 141 143  K 4.1 4.0  CL 104 110  CO2 27  --   BUN 15 16  CREATININE 1.31 1.30  CALCIUM 9.8  --   PROT 8.7*  --   BILITOT 0.2*  --   ALKPHOS 97  --   ALT 28  --   AST 65*  --   GLUCOSE 93 93   Lab Results  Component Value Date   CKTOTAL 408* 07/13/2009   CKMB 32.7* 07/13/2009   TROPONINI  Value: 7.56        POSSIBLE MYOCARDIAL ISCHEMIA. SERIAL TESTING RECOMMENDED. CRITICAL VALUE NOTED.  VALUE IS CONSISTENT WITH PREVIOUSLY REPORTED AND CALLED VALUE.* 07/13/2009   No results found for this basename: PTT   Lab Results  Component Value Date   INR 1.00 09/24/2012   INR 1.03 07/12/2009     Lab Results  Component Value Date   CHOL  Value: 127        ATP III CLASSIFICATION:  <200     mg/dL   Desirable  409-811  mg/dL   Borderline High  >=914    mg/dL   High         78/29/5621   Lab Results  Component Value Date   HDL 35* 07/13/2009   Lab Results  Component Value Date   LDLCALC  Value: 72        Total Cholesterol/HDL:CHD Risk Coronary Heart Disease Risk Table                     Men   Women  1/2 Average Risk   3.4   3.3  Average Risk       5.0   4.4  2 X Average Risk   9.6   7.1  3 X Average Risk  23.4   11.0        Use the calculated Patient Ratio above and the CHD Risk Table to determine the patient's CHD Risk.        ATP III CLASSIFICATION (LDL):  <100     mg/dL   Optimal  308-657  mg/dL   Near or Above                    Optimal  130-159  mg/dL   Borderline  846-962  mg/dL   High  >952     mg/dL   Very High 84/13/2440   Lab Results  Component Value Date   TRIG 98 07/13/2009   Lab Results  Component Value Date   CHOLHDL 3.6 07/13/2009   No results found for this basename: LDLDIRECT      Radiology:  @RISRSLT24 @  EKG:  Normal sinus rhythm with anterior ST elevation  ASSESSMENT: Acute anterior wall MI, tobacco abuse, prior inferior wall MI, history of drug abuse, history of noncompliance  PLAN:  He likely has an LAD occlusion or a subtotal occlusion.  The fact that his pain has gone away may mean that he has reestablished flow.  Further plans will be based on the results of the cardiac cath.  We'll likely plan on bare metal stent to avoid the need for prolonged dual antiplatelet therapy.  In looking back at the record, he has not seen Dr. Anne Fu since 2011.  He'll  need to stop smoking.  He is already on an ACE inhibitor and statin.  He says he takes these medicines regularly.  Will add nonselective beta blocker given history of cocaine use.  He'll likely be watched in the ICU.  Corky Crafts., MD  09/24/2012  4:35 PM

## 2012-09-24 NOTE — ED Provider Notes (Addendum)
History     This chart was scribed for Donnetta Hutching, MD, MD by Smitty Pluck, ED Scribe. The patient was seen in room APA19/APA19 and the patient's care was started at 2:16 PM.   CSN: [id number]  Arrival date & time 09/24/12  1408    Chief Complaint  Patient presents with  . Chest Pain    The history is provided by the patient and medical records. No language interpreter was used.   Ruben Young is a 62 y.o. male with hx of MI (2010), HTN and DDD who presents to the Emergency Department complaining of constant, moderate substernal chest pain radiating to left arm onset 1 day ago in the morning. He describes the pain as tightness. He states that he has moderate SOB. He has taken 4 nitro yesterday and 325 mg of asa today with minor relief. Movement aggravates the chest pain and SOB. Pt denies fever, chills, nausea, vomiting, diarrhea, weakness in extremities, numbness in extremities, cough, SOB and any other pain.  Pt reports hx of boils. He states he smokes cigarettes.   PCP is Dr. Loleta Chance Cardiologist is Stames   Past Medical History  Diagnosis Date  . MI (myocardial infarction)   . DDD (degenerative disc disease), lumbar   . Hypertension   . Hyperlipidemia     Past Surgical History  Procedure Laterality Date  . Tonsillectomy      Family History  Problem Relation Age of Onset  . Cancer Mother     History  Substance Use Topics  . Smoking status: Current Some Day Smoker -- 0.10 packs/day for 44 years    Types: Cigarettes  . Smokeless tobacco: Never Used  . Alcohol Use: No      Review of Systems 10 Systems reviewed and all are negative for acute change except as noted in the HPI.    Allergies  Review of patient's allergies indicates no known allergies.  Home Medications   Current Outpatient Rx  Name  Route  Sig  Dispense  Refill  . aspirin 325 MG EC tablet   Oral   Take 325 mg by mouth daily.         . nitroGLYCERIN (NITROSTAT) 0.4 MG SL tablet    Sublingual   Place 0.4 mg under the tongue every 5 (five) minutes as needed. For chest pains         . quinapril (ACCUPRIL) 10 MG tablet   Oral   Take 10 mg by mouth daily.         . simvastatin (ZOCOR) 20 MG tablet   Oral   Take 20 mg by mouth every evening.           BP 177/102  Pulse 65  Resp 18  Ht 5\' 8"  (1.727 m)  Wt 152 lb (68.947 kg)  BMI 23.12 kg/m2  SpO2 98%  Physical Exam  Nursing note and vitals reviewed. Constitutional: He is oriented to person, place, and time. He appears well-developed and well-nourished.  HENT:  Head: Normocephalic and atraumatic.  Eyes: Conjunctivae and EOM are normal. Pupils are equal, round, and reactive to light.  Neck: Normal range of motion. Neck supple.  Cardiovascular: Normal rate, regular rhythm and normal heart sounds.   HR is 63   Pulmonary/Chest: Effort normal and breath sounds normal.  Abdominal: Soft. Bowel sounds are normal.  Musculoskeletal: Normal range of motion.  Neurological: He is alert and oriented to person, place, and time.  Skin: Skin is warm and dry.  Psychiatric: He has a normal mood and affect.    ED Course  Procedures (including critical care time) DIAGNOSTIC STUDIES: Oxygen Saturation is 98% on [place], normal by my interpretation.    COORDINATION OF CARE: 2:20 PM Discussed ED treatment with pt and pt agrees.  2:33 PM Consult: Discussed pt treatment plan with Dr. Eldridge Dace (Cardiologist) and he recommends giving Heparin, lopressor, Nitro. Pt is awaiting EMS transport to [facility].    CRITICAL CARE Performed by: Lorna Few MD    Total critical care time: 30  Critical care time was exclusive of separately billable procedures and treating other patients.  Critical care was necessary to treat or prevent imminent or life-threatening deterioration.  Critical care was time spent personally by me on the following activities: development of treatment plan with patient and/or surrogate as well as nursing,  discussions with consultants, evaluation of patient's response to treatment, examination of patient, obtaining history from patient or surrogate, ordering and performing treatments and interventions, ordering and review of laboratory studies, ordering and review of radiographic studies, pulse oximetry and re-evaluation of patient's condition.    Labs Reviewed - No data to display No results found.   No diagnosis found.   Date: 09/24/2012  Rate: 68  Rhythm: normal sinus rhythm  QRS Axis: normal  Intervals: QT prolonged  ST/T Wave abnormalities: ST elevation anteriorly and laterally. T wave inversion inferiorly  Conduction Disutrbances:right bundle branch block  Narrative Interpretation:   Old EKG Reviewed: changes noted   MDM  Chest tightness for 24 hours. Previous MI approximately 4 years ago. Cardiac risk factors include smoking, hypertension, hypercholesterolemia. EKG shows ST elevation anterior laterally and T wave inversion inferiorly.  Rx IV heparin, IV nitroglycerin, IV Lopressor, by mouth aspirin taken earlier in the day. Discussed with Dr.Varanasi.  He will accept patient in transfer [facility].  Patient is hemodynamically stable      I personally performed the services described in this documentation, which was scribed in my presence. The recorded information has been reviewed and is accurate.    Donnetta Hutching, MD 09/24/12 1442  Donnetta Hutching, MD 09/24/12 [phone]

## 2012-09-25 DIAGNOSIS — I2109 ST elevation (STEMI) myocardial infarction involving other coronary artery of anterior wall: Secondary | ICD-10-CM | POA: Diagnosis present

## 2012-09-25 LAB — BASIC METABOLIC PANEL
CO2: 24 mEq/L (ref 19–32)
Calcium: 8.9 mg/dL (ref 8.4–10.5)
Chloride: 107 mEq/L (ref 96–112)
Creatinine, Ser: 1.27 mg/dL (ref 0.50–1.35)
Glucose, Bld: 91 mg/dL (ref 70–99)

## 2012-09-25 LAB — CK TOTAL AND CKMB (NOT AT ARMC)
CK, MB: 15.3 ng/mL (ref 0.3–4.0)
Relative Index: 3.8 — ABNORMAL HIGH (ref 0.0–2.5)
Total CK: 439 U/L — ABNORMAL HIGH (ref 7–232)

## 2012-09-25 LAB — LIPID PANEL
HDL: 34 mg/dL — ABNORMAL LOW (ref >39)
LDL Cholesterol: 81 mg/dL (ref 0–99)
Total CHOL/HDL Ratio: 3.9 RATIO
Triglycerides: 91 mg/dL (ref <150)

## 2012-09-25 LAB — CBC
Hemoglobin: 12.8 g/dL — ABNORMAL LOW (ref 13.0–17.0)
MCH: 28.6 pg (ref 26.0–34.0)
MCV: 84.6 fL (ref 78.0–100.0)
Platelets: 248 10*3/uL (ref 150–400)
RBC: 4.47 MIL/uL (ref 4.22–5.81)
WBC: 10 10*3/uL (ref 4.0–10.5)

## 2012-09-25 MED FILL — Dextrose Inj 5%: INTRAVENOUS | Qty: 50 | Status: AC

## 2012-09-25 NOTE — Progress Notes (Signed)
Pt transferred to room 3W36 via wheelchair by RN. Pt' s belongings, chart, phone, and meds taken to new room. Report given to receiving nurse, Ceasar. Patient denies any complaints at this time.   Dawson Bills, RN

## 2012-09-25 NOTE — Progress Notes (Signed)
Subjective:  Overall feels well, no chest pain, no shortness of breath.  Objective:  Vital Signs in the last 24 hours: Temp:  [97.8 F (36.6 C)-98.2 F (36.8 C)] 98.1 F (36.7 C) (02/26 0733) Pulse Rate:  [52-78] 64 (02/26 0733) Resp:  [13-20] 14 (02/26 0733) BP: (91-179)/(47-112) 132/70 mmHg (02/26 0700) SpO2:  [98 %-100 %] 100 % (02/26 0733) Weight:  [66.2 kg (145 lb 15.1 oz)-68.947 kg (152 lb)] 66.2 kg (145 lb 15.1 oz) (02/25 1700)  Intake/Output from previous day: 02/25 0701 - 02/26 0700 In: 493.4 [I.V.:493.4] Out: 925 [Urine:925]   Physical Exam: General: Well developed, well nourished, in no acute distress. Head:  Normocephalic and atraumatic. Lungs: Clear to auscultation and percussion. Heart: Normal S1 and S2.  No murmur, rubs or gallops.  Abdomen: soft, non-tender, positive bowel sounds. Extremities: No clubbing or cyanosis. No edema. Catheterization site intact, radial artery intact Neurologic: Alert and oriented x 3.    Lab Results:  Recent Labs  09/24/12 1421 09/24/12 1447 09/25/12 0600  WBC 13.0*  --  10.0  HGB 16.1 16.7 12.8*  PLT 281  --  248    Recent Labs  09/24/12 1421 09/24/12 1447 09/25/12 0600  NA 141 143 140  K 4.1 4.0 3.9  CL 104 110 107  CO2 27  --  24  GLUCOSE 93 93 91  BUN 15 16 13   CREATININE 1.31 1.30 1.27   No results found for this basename: TROPONINI, CK, MB,  in the last 72 hours Hepatic Function Panel  Recent Labs  09/24/12 1421  PROT 8.7*  ALBUMIN 3.6  AST 65*  ALT 28  ALKPHOS 97  BILITOT 0.2*    Recent Labs  09/25/12 0600  CHOL 133   No results found for this basename: PROTIME,  in the last 72 hours  Imaging: Dg Chest Portable 1 View  09/24/2012  *RADIOLOGY REPORT*  Clinical Data: Chest pain.  PORTABLE CHEST - 1 VIEW  Comparison: Two-view chest 07/12/2009.  Findings: The heart size is normal.  Lungs are clear.  The visualized soft tissues and bony thorax are unremarkable.  IMPRESSION: Negative one-view  chest.   Original Report Authenticated By: Marin Roberts, M.D.    Personally viewed.   Telemetry: Persistent T-wave inversion. No adverse arrhythmias, no VT. Personally viewed.   EKG:  Same ST segment changes as seen on the initial EKG in the anterior leads. Possible anterior wall aneurysm.  Cardiac Studies:  LAD bare-metal stent placed. RCA previously placed bare-metal stent patent. Awaiting echocardiogram.  Assessment/Plan:  62 year old with ST elevation myocardial infarction, prior cocaine use, hypertension.  1. ST elevation myocardial infarction-bare-metal stent to LAD. Dual antiplatelet therapy. Chest pain-free currently. Continue to titrate medications. Utilizing nonselective beta blockers as tolerated given prior cocaine use. ACE inhibitor as tolerated. Statin. Aspirin, dual antiplatelet therapy. Elevated MB of 29. Trending down currently.  2. Persistently abnormal EKG-await echocardiogram. Probably aneurysmal segment of anterior wall.  3. Transfer to floor.  4. Hyperlipidemia-statin therapy  5. Coronary artery disease-as described above.  Cardiac rehabilitation.    Ruben Young 09/25/2012, 8:41 AM

## 2012-09-25 NOTE — Care Management Note (Addendum)
    Page 1 of 1   09/26/2012     10:11:43 AM   CARE MANAGEMENT NOTE 09/26/2012  Patient:  Ruben Young, Ruben Young   Account Number:  [id number]  Date Initiated:  09/25/2012  Documentation initiated by:  Junius Creamer  Subjective/Objective Assessment:   adm w mi     Action/Plan:   lives alone, pcp dr gerald hill   Anticipated DC Date:     Anticipated DC Plan:        DC Planning Services  CM consult      Choice offered to / List presented to:             Status of service:  Completed, signed off Medicare Important Message given?   (If response is "NO", the following Medicare IM given date fields will be blank) Date Medicare IM given:   Date Additional Medicare IM given:    Discharge Disposition:  HOME/SELF CARE  Per UR Regulation:  Reviewed for med. necessity/level of care/duration of stay  If discussed at Long Length of Stay Meetings, dates discussed:    Comments:  09-26-12 [address] [place] [phone] CM did call [facility] in [place] and medication is available. Pt has 30 day free card and Rx.   2/26 0931 debbie dowell rn,bsn left pt effient 30day free and copay assist card. pt has medicaid ins.

## 2012-09-25 NOTE — Progress Notes (Signed)
CARDIAC REHAB PHASE I   PRE:  Rate/Rhythm: 71SR  BP:  Supine: 135/67  Sitting:   Standing:    SaO2: 97%RA  MODE:  Ambulation: 700 ft   POST:  Rate/Rhythem: 76SR  BP:  Supine:   Sitting: 150/73  Standing:    SaO2: 99%RA 1000-1050 Pt walked 700 ft with steady gait. Tolerated well. Denied CP. To recliner after walk. Education completed except diet. Left sheet for heart healthy diet and will review tomorrow. Discussed smoking cessation and gave handouts. Encouraged pt to call 1800QUITNOW. Pt asked about e-cig and I told him could not recommend due to not FDA approved and have to be careful with what you put in. May get more nicotine. Pt states he goes days sometimes without smoking and then it is usually just a few. Discussed CRP 2. Pt gave permission to refer to [place].  Duanne Limerick

## 2012-09-25 NOTE — Progress Notes (Signed)
Chaplain visited Cath lab immediately after receiving [employer] pager message. Pt was in Lab #5 during the entire procedure. Chaplain met and settled two sisters of pt in consult room #4. Chaplain listened empathically to family, served as Print production planner between [employer] and family members. Pt was sent to 2901 after procedure. Chaplain provided ministry of presence until family could visit with pt. Chaplain will follow-up on pt and family in the AM.Family thanked Chaplain for his presence and support. Kelle Darting [phone]

## 2012-09-26 MED ORDER — CARVEDILOL 6.25 MG PO TABS
6.2500 mg | ORAL_TABLET | ORAL | Status: AC
  Administered 2012-09-26: 6.25 mg via ORAL
  Filled 2012-09-26: qty 1

## 2012-09-26 MED ORDER — CARVEDILOL 6.25 MG PO TABS: 6.2500 mg | ORAL_TABLET | Freq: Two times a day (BID) | ORAL | Status: DC

## 2012-09-26 MED ORDER — PRASUGREL HCL 10 MG PO TABS: 10.0000 mg | ORAL_TABLET | Freq: Every day | ORAL | Status: DC

## 2012-09-26 MED ORDER — ASPIRIN 81 MG PO TBEC: 325.0000 mg | DELAYED_RELEASE_TABLET | Freq: Every day | ORAL | Status: DC

## 2012-09-26 MED ORDER — CARVEDILOL 6.25 MG PO TABS
6.2500 mg | ORAL_TABLET | Freq: Two times a day (BID) | ORAL | Status: DC
  Filled 2012-09-26 (×2): qty 1

## 2012-09-26 NOTE — Discharge Summary (Signed)
Patient ID: Ruben Young MRN: [id number] DOB/AGE: 08/13/1950 62 y.o.  Admit date: 09/24/2012 Discharge date: 09/26/2012  Primary Discharge Diagnosis: Anterior STEMI Secondary Discharge Diagnosis: HTN, Tobacco use, CAD (BMS to LAD)  Significant Diagnostic Studies: CATH: PROCEDURE: Left heart catheterization with selective coronary angiography, PCI LAD.  INDICATIONS: Anterior ST elevation MI  The risks, benefits, and details of the procedure were explained to the patient. The patient verbalized understanding and wanted to proceed. Informed written consent was obtained.  PROCEDURE TECHNIQUE: After Xylocaine anesthesia a 16F sheath was placed in the right radial artery with a single anterior needle wall stick. Left coronary angiography was done using a CLS 3.0 guide catheter. Right coronary angiography was done using a Judkins R4 guide catheter. Left heart catheterization was done using a pigtail catheter. TR band was used for hemostasis.  CONTRAST: Total of 95 cc.  COMPLICATIONS: None.  HEMODYNAMICS: Aortic pressure was 154/72; LV pressure was 158/4; LVEDP 15. There was no gradient between the left ventricle and aorta.  ANGIOGRAPHIC DATA: The left main coronary artery is widely patent.  The left anterior descending artery is a large vessel which reaches the apex. In the mid vessel, there is a subtotal occlusion with a large thrombus burden, and TIMI 1 flow. There appears to be a segment which may be intramyocardial past the subtotal occlusion. There is several medium-sized diagonals which are widely patent but have some mild ostial disease.  The left circumflex artery is a large vessel. There is mild atherosclerosis in the midportion of this vessel. The OM1 is a medium-size vessel and widely patent. The OM 2 and 3 are medium-sized and widely patent.  The right coronary artery is a large dominant vessel. The mid vessel stent is widely patent. The posterior descending artery is a medium-sized  vessel and appears widely patent. The posterior lateral artery is a large vessel .  LEFT VENTRICULOGRAM: Left ventricular angiogram was not done to avoid excessive contrast administration.  PCI NARRATIVE: A CLS 3.0 guiding catheter was used to engage the left main. Angiomax was used for anticoagulation. An ACT was used to check that the Angiomax is therapeutic. A pro-water wire was placed across the area of disease. An expressway aspiration catheter was used with success to remove thrombus an improved flow. Intracoronary nitroglycerin was used as there appeared to be some spasm in the distal vessel. It was unclear whether this was myocardial bridging or vasospasm. A 2.5 x 15 balloon was used to predilate the lesion. A 3.0 x 20 Veriflex stent was used for the diseased area. This was deployed at 18 atmospheres. The stent was postdilated with a 3.75 x 15 Catarina apex balloon. There is an excellent angiographic result. There is no residual stenosis.  IMPRESSIONS:  1. Normal left main coronary artery. 2. Subtotally occluded mid left anterior descending artery which was responsible for the patient's presentation of anterior STEMI. This was successfully treated with a bare metal stent, 3.0 x 20, postdilated to 3.75 mm in diameter. 3. Mild atherosclerosis in the left circumflex artery and its branches. 4. Widely patent stent in the mid right coronary artery. 5. LVEDP 15 mmHg. Ejection fraction not assessed. RECOMMENDATION: He'll be watched in the ICU. Continue dual antiplatelet therapy for at least 30 days but preferably longer. He'll need aggressive secondary prevention including tobacco cessation. Start nonselective beta blocker. Check lipids in the morning. Continue statin. Continue ACE inhibitor. Check echocardiogram.   Hospital Course: 62 year old with prior history of BMS to RCA who came in  with late presenting anterior ST elevation myocardial infarction and was taken to the cardiac catheterization lab where a  BMS was placed to the LAD, 3.0 x 20 mm. His ejection fraction by echocardiogram was approximally 40%. Mid to distal anteroseptal wall was dyskinetic. He has remote history of cocaine use. Tobacco history. Tobacco cessation counseling. Cardiac rehabilitation walk patient during hospitalization, no chest pain. Overall he is feeling well, ready to go home. Carvedilol, nonselective beta blocker has been initiated and uptitrate to 6.25 mg twice a day. He is also on ACE inhibitor. Hemoglobin 12.8 on discharge. This is decreased from admission recorded at 16.1 however he has no clear signs of bleeding.  The importance of dual antiplatelet therapy have been expressed to him. With bare-metal stent, he needs at least one month of dual antiplatelet therapy. We have given him prescription.  Discharge Exam: Blood pressure 136/77, pulse 60, temperature 98.1 F (36.7 C), temperature source Oral, resp. rate 18, height 5\' 8"  (1.727 m), weight 66.815 kg (147 lb 4.8 oz), SpO2 100.00%.   General appearance: alert and cooperative Cardiovascular: Regular rate and rhythm, no significant murmurs, no JVD Lungs clear to auscultation Abdomen soft nontender Catheterization site clean dry and intact.  Labs:   Lab Results  Component Value Date   WBC 10.0 09/25/2012   HGB 12.8* 09/25/2012   HCT 37.8* 09/25/2012   MCV 84.6 09/25/2012   PLT 248 09/25/2012    Recent Labs Lab 09/24/12 1421  09/25/12 0600  NA 141  < > 140  K 4.1  < > 3.9  CL 104  < > 107  CO2 27  --  24  BUN 15  < > 13  CREATININE 1.31  < > 1.27  CALCIUM 9.8  --  8.9  PROT 8.7*  --   --   BILITOT 0.2*  --   --   ALKPHOS 97  --   --   ALT 28  --   --   AST 65*  --   --   GLUCOSE 93  < > 91  < > = values in this interval not displayed. Lab Results  Component Value Date   CKTOTAL 439* 09/25/2012   CKMB 15.3* 09/25/2012   TROPONINI  Value: 7.56        POSSIBLE MYOCARDIAL ISCHEMIA. SERIAL TESTING RECOMMENDED. CRITICAL VALUE NOTED.  VALUE IS CONSISTENT  WITH PREVIOUSLY REPORTED AND CALLED VALUE.* 07/13/2009    Lab Results  Component Value Date   CHOL 133 09/25/2012   CHOL  Value: 127        ATP III CLASSIFICATION:  <200     mg/dL   Desirable  161-096  mg/dL   Borderline High  >=045    mg/dL   High        40/98/1191   Lab Results  Component Value Date   HDL 34* 09/25/2012   HDL 35* 07/13/2009   Lab Results  Component Value Date   LDLCALC 81 09/25/2012   LDLCALC  Value: 72        Total Cholesterol/HDL:CHD Risk Coronary Heart Disease Risk Table                     Men   Women  1/2 Average Risk   3.4   3.3  Average Risk       5.0   4.4  2 X Average Risk   9.6   7.1  3 X Average Risk  23.4   11.0  Use the calculated Patient Ratio above and the CHD Risk Table to determine the patient's CHD Risk.        ATP III CLASSIFICATION (LDL):  <100     mg/dL   Optimal  562-130  mg/dL   Near or Above                    Optimal  130-159  mg/dL   Borderline  865-784  mg/dL   High  >696     mg/dL   Very High 29/52/8413   Lab Results  Component Value Date   TRIG 91 09/25/2012   TRIG 98 07/13/2009   Lab Results  Component Value Date   CHOLHDL 3.9 09/25/2012   CHOLHDL 3.6 07/13/2009   No results found for this basename: LDLDIRECT       EKG: Persistent ST segment elevation and biphasic T waves in the precordial leads.  FOLLOW UP PLANS AND APPOINTMENTS Discharge Orders   Future Appointments Provider Department Dept Phone   02/20/2013 8:30 AM [facility] [phone]   Eat a light meal the night before the exam but please avoid gaseous foods.   Nothing to eat or drink for at least 8 hours prior to the exam. No gum chewing or smoking the morning of the exam. Please take your morning medications with small sips of water, especially blood pressure medication. If you have several vascular lab exams and will see physician, please bring a snack with you.   02/20/2013 9:40 AM Evern Bio, NP [facility] [phone]   Future Orders Complete By Expires     Amb Referral to Cardiac Rehabilitation  As directed     Diet - low sodium heart healthy  As directed     Increase activity slowly  As directed         Medication List    STOP taking these medications       metoprolol tartrate 25 MG tablet  Commonly known as:  LOPRESSOR      TAKE these medications       aspirin 81 MG EC tablet  Take 4 tablets (325 mg total) by mouth daily.     carvedilol 6.25 MG tablet  Commonly known as:  COREG  Take 1 tablet (6.25 mg total) by mouth 2 (two) times daily with a meal.     nitroGLYCERIN 0.4 MG SL tablet  Commonly known as:  NITROSTAT  Place 0.4 mg under the tongue every 5 (five) minutes as needed. For chest pains     prasugrel 10 MG Tabs  Commonly known as:  EFFIENT  Take 1 tablet (10 mg total) by mouth daily.     quinapril 10 MG tablet  Commonly known as:  ACCUPRIL  Take 10 mg by mouth daily.     simvastatin 20 MG tablet  Commonly known as:  ZOCOR  Take 20 mg by mouth every evening.           Follow-up Information   Follow up with Donato Schultz, MD On 10/04/2012. (2:15pm)    Contact information:   [address] [place] [address] [phone]       BRING ALL MEDICATIONS WITH YOU TO FOLLOW UP APPOINTMENTS  Time spent with patient to include physician time: 35 minutes spent with patient, medicine reconciliation, review of medical records. Education. SignedDonato Schultz 09/26/2012, 8:19 AM

## 2012-09-26 NOTE — Progress Notes (Addendum)
D/c orders received;IV removed with gauze on, pt remains in stable condition, pt meds and instructions reviewed and given to pt; reviewed with pt importance of taking meds as prescribed; pt d/c to home; spoke with Dr.Skains and clarified with MD that he wanted pt to take 81mg  of ASA at home; pt made aware

## 2012-09-26 NOTE — Progress Notes (Signed)
0940-1000 Cardiac Rehab Completed discharge education with pt. Discussed heart healthy diet. He voices understanding. Answered pt's questions.

## 2013-02-19 ENCOUNTER — Other Ambulatory Visit: Payer: Self-pay | Admitting: *Deleted

## 2013-02-19 DIAGNOSIS — I724 Aneurysm of artery of lower extremity: Secondary | ICD-10-CM

## 2013-02-20 ENCOUNTER — Other Ambulatory Visit (INDEPENDENT_AMBULATORY_CARE_PROVIDER_SITE_OTHER): Payer: Medicaid Other

## 2013-02-20 ENCOUNTER — Ambulatory Visit: Payer: Medicaid Other | Admitting: Neurosurgery

## 2013-02-20 DIAGNOSIS — I723 Aneurysm of iliac artery: Secondary | ICD-10-CM

## 2013-02-20 DIAGNOSIS — I724 Aneurysm of artery of lower extremity: Secondary | ICD-10-CM

## 2013-02-21 ENCOUNTER — Other Ambulatory Visit: Payer: Self-pay | Admitting: *Deleted

## 2013-02-21 DIAGNOSIS — I723 Aneurysm of iliac artery: Secondary | ICD-10-CM

## 2013-02-27 ENCOUNTER — Encounter: Payer: Self-pay | Admitting: Vascular Surgery

## 2013-07-09 ENCOUNTER — Ambulatory Visit: Payer: Medicaid Other | Admitting: Cardiology

## 2013-08-05 ENCOUNTER — Encounter: Payer: Self-pay | Admitting: Cardiology

## 2013-08-05 ENCOUNTER — Ambulatory Visit (INDEPENDENT_AMBULATORY_CARE_PROVIDER_SITE_OTHER): Payer: Medicaid Other | Admitting: Cardiology

## 2013-08-05 VITALS — BP 180/101 | HR 68 | Ht 68.0 in | Wt 156.0 lb

## 2013-08-05 DIAGNOSIS — Z79899 Other long term (current) drug therapy: Secondary | ICD-10-CM

## 2013-08-05 DIAGNOSIS — I2510 Atherosclerotic heart disease of native coronary artery without angina pectoris: Secondary | ICD-10-CM

## 2013-08-05 DIAGNOSIS — I2589 Other forms of chronic ischemic heart disease: Secondary | ICD-10-CM

## 2013-08-05 DIAGNOSIS — I252 Old myocardial infarction: Secondary | ICD-10-CM

## 2013-08-05 DIAGNOSIS — I255 Ischemic cardiomyopathy: Secondary | ICD-10-CM

## 2013-08-05 DIAGNOSIS — I10 Essential (primary) hypertension: Secondary | ICD-10-CM

## 2013-08-05 MED ORDER — METOPROLOL SUCCINATE ER 50 MG PO TB24: 50.0000 mg | ORAL_TABLET | Freq: Every day | ORAL | Status: DC

## 2013-08-05 MED ORDER — LOSARTAN POTASSIUM 100 MG PO TABS: 100.0000 mg | ORAL_TABLET | Freq: Every day | ORAL | Status: DC

## 2013-08-05 NOTE — Patient Instructions (Addendum)
Your physician has recommended you make the following change in your medication:   Stop: 1. Carvedilol 2. Effient 3. Quinapril  Start: 1. Metoprolol ER 50mg  once daily 2. Losartan 100mg  once daily  Your physician recommends that you schedule a follow-up appointment in: 2 weeks with PA (Blood Pressure).

## 2013-08-05 NOTE — Progress Notes (Signed)
[facility]. [address] [place], [place]  [address] Phone: [phone] Fax:  [phone]  Date:  08/05/2013   ID:  Ruben Young, DOB 07/17/1951, MRN [id number]  PCP:  Maggie Font, MD   History of Present Illness: Ruben Young is a 63 y.o. male with prior anterior wall myocardial infarction on 09/24/12 with bare-metal stent to LAD and previously placed RCA stent, EF 40% here for followup   Wt Readings from Last 3 Encounters:  08/05/13 156 lb (70.761 kg)  09/26/12 147 lb 4.8 oz (66.815 kg)  09/26/12 147 lb 4.8 oz (66.815 kg)     Past Medical History  Diagnosis Date  . MI (myocardial infarction)   . DDD (degenerative disc disease), lumbar   . Hypertension   . Hyperlipidemia   . Old myocardial infarction   . Tobacco use disorder     Past Surgical History  Procedure Laterality Date  . Tonsillectomy    . Coronary angioplasty with stent placement      Current Outpatient Prescriptions  Medication Sig Dispense Refill  . aspirin 81 MG EC tablet Take 4 tablets (325 mg total) by mouth daily.  30 tablet  12  . carvedilol (COREG) 6.25 MG tablet Take 1 tablet (6.25 mg total) by mouth 2 (two) times daily with a meal.  60 tablet  12  . clopidogrel (PLAVIX) 75 MG tablet Take 75 mg by mouth daily with breakfast.      . LOSARTAN POTASSIUM PO Take 100 mg by mouth.      . nitroGLYCERIN (NITROSTAT) 0.4 MG SL tablet Place 0.4 mg under the tongue every 5 (five) minutes as needed. For chest pains      . prasugrel (EFFIENT) 10 MG TABS Take 1 tablet (10 mg total) by mouth daily.  30 tablet  12  . quinapril (ACCUPRIL) 10 MG tablet Take 10 mg by mouth daily.      . simvastatin (ZOCOR) 20 MG tablet Take 20 mg by mouth every evening.       No current facility-administered medications for this visit.    Allergies:   No Known Allergies  Social History:  The patient  reports that he has been smoking Cigarettes.  He has a 4.4 pack-year smoking history. He has never used  smokeless tobacco. He reports that he uses illicit drugs. He reports that he does not drink alcohol.   ROS:  Please see the history of present illness.   No CP, no bleeding.     PHYSICAL EXAM: VS:  BP 180/101  Pulse 68  Ht 5\' 8"  (1.727 m)  Wt 156 lb (70.761 kg)  BMI 23.73 kg/m2 Well nourished, well developed, in no acute distress HEENT: normal Neck: no JVD Cardiac:  normal S1, S2; RRR; no murmur Lungs:  clear to auscultation bilaterally, no wheezing, rhonchi or rales Abd: soft, nontender, no hepatomegaly Ext: no edema Skin: warm and dry Neuro: no focal abnormalities noted  EKG:  Sinus rhythm rate 68, no other significant abnormalities.     ASSESSMENT AND PLAN:  1. Old myocardial infarction-anterior ST elevation myocardial infarction on 09/24/12. PCI to LAD, bare-metal stent. Previously widely patent stent in the mid right coronary artery. Ejection fraction at that time 40%. Mid to distal anteroseptal wall akinetic/dyskinetic. 2. CAD-LAD stent in the setting of anterior wall myocardial infarction, previous RCA stent widely patent. Continuing with both Plavix as well as aspirin. 3. Ischemic cardiomyopathy-ejection fraction 40% previously. NYHA class 2. 4. Hypertension-currently  uncontrolled. He has not had his medications. We'll go ahead and prescribe him metoprolol extended release 50 mg once a day and losartan 100 mg once a day. 5. Return to clinic in 2 weeks for blood pressure evaluation visit.  Signed, Candee Furbish, MD [facility]  08/05/2013 4:13 PM

## 2013-08-19 ENCOUNTER — Ambulatory Visit: Payer: Medicaid Other | Admitting: Physician Assistant

## 2013-08-31 ENCOUNTER — Other Ambulatory Visit: Payer: Self-pay | Admitting: *Deleted

## 2013-08-31 DIAGNOSIS — E7800 Pure hypercholesterolemia, unspecified: Secondary | ICD-10-CM

## 2013-09-16 ENCOUNTER — Other Ambulatory Visit: Payer: Self-pay | Admitting: Vascular Surgery

## 2013-09-16 DIAGNOSIS — I723 Aneurysm of iliac artery: Secondary | ICD-10-CM

## 2013-10-08 ENCOUNTER — Other Ambulatory Visit: Payer: Self-pay

## 2013-10-08 MED ORDER — LOSARTAN POTASSIUM 100 MG PO TABS: 100.0000 mg | ORAL_TABLET | Freq: Every day | ORAL | Status: DC

## 2013-10-08 MED ORDER — METOPROLOL SUCCINATE ER 50 MG PO TB24: 50.0000 mg | ORAL_TABLET | Freq: Every day | ORAL | Status: DC

## 2013-10-12 ENCOUNTER — Emergency Department (HOSPITAL_COMMUNITY): Payer: Medicaid Other

## 2013-10-12 ENCOUNTER — Encounter (HOSPITAL_COMMUNITY): Payer: Self-pay | Admitting: Emergency Medicine

## 2013-10-12 ENCOUNTER — Emergency Department (HOSPITAL_COMMUNITY)
Admission: EM | Admit: 2013-10-12 | Discharge: 2013-10-12 | Disposition: A | Discharge status: Home / Self Care | Payer: Medicaid Other | Attending: Emergency Medicine | Admitting: Emergency Medicine

## 2013-10-12 DIAGNOSIS — Z9861 Coronary angioplasty status: Secondary | ICD-10-CM | POA: Insufficient documentation

## 2013-10-12 DIAGNOSIS — M549 Dorsalgia, unspecified: Secondary | ICD-10-CM | POA: Insufficient documentation

## 2013-10-12 DIAGNOSIS — F17200 Nicotine dependence, unspecified, uncomplicated: Secondary | ICD-10-CM | POA: Insufficient documentation

## 2013-10-12 DIAGNOSIS — Z7902 Long term (current) use of antithrombotics/antiplatelets: Secondary | ICD-10-CM | POA: Insufficient documentation

## 2013-10-12 DIAGNOSIS — L03211 Cellulitis of face: Secondary | ICD-10-CM | POA: Insufficient documentation

## 2013-10-12 DIAGNOSIS — I252 Old myocardial infarction: Secondary | ICD-10-CM | POA: Insufficient documentation

## 2013-10-12 DIAGNOSIS — Z862 Personal history of diseases of the blood and blood-forming organs and certain disorders involving the immune mechanism: Secondary | ICD-10-CM | POA: Insufficient documentation

## 2013-10-12 DIAGNOSIS — Z8739 Personal history of other diseases of the musculoskeletal system and connective tissue: Secondary | ICD-10-CM | POA: Insufficient documentation

## 2013-10-12 DIAGNOSIS — Z8639 Personal history of other endocrine, nutritional and metabolic disease: Secondary | ICD-10-CM | POA: Insufficient documentation

## 2013-10-12 DIAGNOSIS — Z7982 Long term (current) use of aspirin: Secondary | ICD-10-CM | POA: Insufficient documentation

## 2013-10-12 DIAGNOSIS — I10 Essential (primary) hypertension: Secondary | ICD-10-CM | POA: Insufficient documentation

## 2013-10-12 DIAGNOSIS — L0201 Cutaneous abscess of face: Secondary | ICD-10-CM | POA: Insufficient documentation

## 2013-10-12 DIAGNOSIS — Z79899 Other long term (current) drug therapy: Secondary | ICD-10-CM | POA: Insufficient documentation

## 2013-10-12 LAB — BASIC METABOLIC PANEL
BUN: 10 mg/dL (ref 6–23)
CALCIUM: 9.3 mg/dL (ref 8.4–10.5)
CO2: 25 meq/L (ref 19–32)
CREATININE: 1.18 mg/dL (ref 0.50–1.35)
Chloride: 102 mEq/L (ref 96–112)
GFR calc Af Amer: 74 mL/min — ABNORMAL LOW (ref >90)
GFR calc non Af Amer: 63 mL/min — ABNORMAL LOW (ref >90)
GLUCOSE: 102 mg/dL — AB (ref 70–99)
Potassium: 3.8 mEq/L (ref 3.7–5.3)
Sodium: 139 mEq/L (ref 137–147)

## 2013-10-12 MED ORDER — IOHEXOL 300 MG/ML  SOLN
75.0000 mL | Freq: Once | INTRAMUSCULAR | Status: AC | PRN
  Administered 2013-10-12: 75 mL via INTRAVENOUS

## 2013-10-12 MED ORDER — DOXYCYCLINE HYCLATE 100 MG PO CAPS: 100.0000 mg | ORAL_CAPSULE | Freq: Two times a day (BID) | ORAL | Status: AC

## 2013-10-12 MED ORDER — VANCOMYCIN HCL IN DEXTROSE 1-5 GM/200ML-% IV SOLN
1000.0000 mg | Freq: Once | INTRAVENOUS | Status: AC
  Administered 2013-10-12: 1000 mg via INTRAVENOUS
  Filled 2013-10-12: qty 200

## 2013-10-12 MED ORDER — HYDROCODONE-ACETAMINOPHEN 5-325 MG PO TABS: 1.0000 | ORAL_TABLET | ORAL | Status: DC | PRN

## 2013-10-12 MED ORDER — DIPHENHYDRAMINE HCL 50 MG/ML IJ SOLN
25.0000 mg | Freq: Once | INTRAMUSCULAR | Status: AC
  Administered 2013-10-12: 25 mg via INTRAVENOUS
  Filled 2013-10-12: qty 1

## 2013-10-12 MED ORDER — AMOXICILLIN-POT CLAVULANATE 875-125 MG PO TABS: 1.0000 | ORAL_TABLET | Freq: Two times a day (BID) | ORAL | Status: DC

## 2013-10-12 NOTE — ED Provider Notes (Signed)
CSN: [id number]     Arrival date & time 10/12/13  1202 History   First MD Initiated Contact with Patient 10/12/13 1335     Chief Complaint  Patient presents with  . Facial Swelling     (Consider location/radiation/quality/duration/timing/severity/associated sxs/prior Treatment) HPI Comments: Patient is a 63 year old male who presents to the emergency department with a complaint of facial swelling. The patient states that approximately 3-4 days ago he began to notice some itching on the side of his right face. Shortly after that he noticed redness and then today she noticed swelling from his temporal area 2 under his right eye. The patient states he has only minimal on pain. He denies any vision changes. He denies any unusual headache. He denies chills or high temperature elevations. He has not had any operations or procedures involving the face or eye. He denies history of diabetes. He has not taken anything for this up to this point.  The history is provided by the patient.    Past Medical History  Diagnosis Date  . MI (myocardial infarction)   . DDD (degenerative disc disease), lumbar   . Hypertension   . Hyperlipidemia   . Old myocardial infarction   . Tobacco use disorder    Past Surgical History  Procedure Laterality Date  . Tonsillectomy    . Coronary angioplasty with stent placement     Family History  Problem Relation Age of Onset  . Cancer Mother    History  Substance Use Topics  . Smoking status: Current Some Day Smoker -- 0.10 packs/day for 44 years    Types: Cigarettes  . Smokeless tobacco: Never Used  . Alcohol Use: No    Review of Systems  Constitutional: Negative for activity change.       All ROS Neg except as noted in HPI  HENT: Positive for facial swelling. Negative for nosebleeds.   Eyes: Negative for photophobia and discharge.  Respiratory: Negative for cough, shortness of breath and wheezing.   Cardiovascular: Negative for chest pain and  palpitations.  Gastrointestinal: Negative for abdominal pain and blood in stool.  Genitourinary: Negative for dysuria, frequency and hematuria.  Musculoskeletal: Positive for back pain. Negative for arthralgias and neck pain.  Skin: Negative.   Neurological: Negative for dizziness, seizures and speech difficulty.  Psychiatric/Behavioral: Negative for hallucinations and confusion.      Allergies  Review of patient's allergies indicates no known allergies.  Home Medications   Current Outpatient Rx  Name  Route  Sig  Dispense  Refill  . aspirin 81 MG EC tablet   Oral   Take 4 tablets (325 mg total) by mouth daily.   30 tablet   12   . carvedilol (COREG) 6.25 MG tablet   Oral   Take 6.25 mg by mouth 2 (two) times daily with a meal.         . clopidogrel (PLAVIX) 75 MG tablet   Oral   Take 75 mg by mouth daily with breakfast.         . losartan (COZAAR) 100 MG tablet   Oral   Take 1 tablet (100 mg total) by mouth daily.   30 tablet   3   . metoprolol succinate (TOPROL-XL) 50 MG 24 hr tablet   Oral   Take 1 tablet (50 mg total) by mouth daily. Take with or immediately following a meal.   30 tablet   3   . nitroGLYCERIN (NITROSTAT) 0.4 MG SL tablet   Sublingual  Place 0.4 mg under the tongue every 5 (five) minutes as needed. For chest pains          BP 166/87  Temp(Src) 98.4 F (36.9 C) (Oral)  Resp 18  Ht 5\' 8"  (1.727 m)  Wt 158 lb (71.668 kg)  BMI 24.03 kg/m2  SpO2 96% Physical Exam  Nursing note and vitals reviewed. Constitutional: He is oriented to person, place, and time. He appears well-developed and well-nourished.  Non-toxic appearance.  HENT:  Head: Normocephalic.  Right Ear: Tympanic membrane and external ear normal.  Left Ear: Tympanic membrane and external ear normal.  There is swelling and redness in the preauricular area extending to the temporal. Down to the right cheek, and then under the right eye. There is actual a blister type  formation under the right eye. The area is warm but not hot. Minimal tenderness present.  There is no mastoid tenderness or redness present. The tympanic membranes are pearly gray without pathology bilaterally.  Eyes: Conjunctivae, EOM and lids are normal. Pupils are equal, round, and reactive to light. Right eye exhibits no discharge. Left eye exhibits no discharge. No scleral icterus.  Neck: Normal range of motion. Neck supple. Carotid bruit is not present.  Palpable submental nodes present with tenderness to palpation.  Cardiovascular: Normal rate, regular rhythm, normal heart sounds, intact distal pulses and normal pulses.   Pulmonary/Chest: Breath sounds normal. No respiratory distress.  Abdominal: Soft. Bowel sounds are normal. There is no tenderness. There is no guarding.  Musculoskeletal: Normal range of motion.  Lymphadenopathy:       Head (right side): No submandibular adenopathy present.       Head (left side): No submandibular adenopathy present.    He has no cervical adenopathy.  Neurological: He is alert and oriented to person, place, and time. He has normal strength. No cranial nerve deficit or sensory deficit.  Skin: Skin is warm and dry.  Psychiatric: He has a normal mood and affect. His speech is normal.    ED Course  Procedures (including critical care time) Labs Review Labs Reviewed  BASIC METABOLIC PANEL - Abnormal; Notable for the following:    Glucose, Bld 102 (*)    GFR calc non Af Amer 63 (*)    GFR calc Af Amer 74 (*)    All other components within normal limits   Imaging Review No results found.   EKG Interpretation None      MDM Vital signs are well within normal limits.  Basic metabolic panel is well within normal limits. The macula filtration rate is 74.  Contrasted CT maxillofacial study reveals extensive subcutaneous soft tissue swelling over the right side most compatible with cellulitis. There is no evidence for abscess.  The patient was  given a gram of vancomycin. The plan will be for the patient to be placed on doxycycline and Augmentin. Patient will be reevaluated on Tuesday, March 17. Patient advised to return sooner if any changes, problems, or concerns.  At discharge the patient remembered that he had died his beard recently. He had areas of skin irritation and itching, and it was shortly after this that he began to have the facial swelling and redness. Suspect that the patient had a break in the skin in the area of his beard that allowed for infection.    Final diagnoses:  None    **I have reviewed nursing notes, vital signs, and all appropriate lab and imaging results for this patient.Evorn Gong  Marshell Levan, PA-C 10/12/13 [place], PA-C 10/12/13 1728

## 2013-10-12 NOTE — ED Provider Notes (Signed)
Medical screening examination/treatment/procedure(s) were performed by non-physician practitioner and as supervising physician I was immediately available for consultation/collaboration.  Carmin Muskrat, MD 10/12/13 [phone]

## 2013-10-12 NOTE — ED Notes (Signed)
Pt transferred to room 15. Report given to Constance Holster, RN

## 2013-10-12 NOTE — ED Notes (Signed)
Pt c/o facial swelling x 3 days. Swelling and redness to right eye noted.

## 2013-10-12 NOTE — Discharge Instructions (Signed)
Examination and CT scans are consistent with cellulitis of the face Cellulitis Cellulitis is an infection of the skin and the tissue beneath it. The infected area is usually red and tender. Cellulitis occurs most often in the arms and lower legs.  CAUSES  Cellulitis is caused by bacteria that enter the skin through cracks or cuts in the skin. The most common types of bacteria that cause cellulitis are Staphylococcus and Streptococcus. SYMPTOMS   Redness and warmth.  Swelling.  Tenderness or pain.  Fever. DIAGNOSIS  Your caregiver can usually determine what is wrong based on a physical exam. Blood tests may also be done. TREATMENT  Treatment usually involves taking an antibiotic medicine. HOME CARE INSTRUCTIONS   Take your antibiotics as directed. Finish them even if you start to feel better.  Keep the infected arm or leg elevated to reduce swelling.  Apply a warm cloth to the affected area up to 4 times per day to relieve pain.  Only take over-the-counter or prescription medicines for pain, discomfort, or fever as directed by your caregiver.  Keep all follow-up appointments as directed by your caregiver. SEEK MEDICAL CARE IF:   You notice red streaks coming from the infected area.  Your red area gets larger or turns dark in color.  Your bone or joint underneath the infected area becomes painful after the skin has healed.  Your infection returns in the same area or another area.  You notice a swollen bump in the infected area.  You develop new symptoms. SEEK IMMEDIATE MEDICAL CARE IF:   You have a fever.  You feel very sleepy.  You develop vomiting or diarrhea.  You have a general ill feeling (malaise) with muscle aches and pains. MAKE SURE YOU:   Understand these instructions.  Will watch your condition.  Will get help right away if you are not doing well or get worse. Document Released: 04/26/2005 Document Revised: 01/16/2012 Document Reviewed:  10/02/2011 [facility] Patient Information 2014 [place]. Marland Kitchen You were treated in the emergency department with vancomycin. Please use doxycycline and Augmentin daily, please take these with food until all taken. Use Tylenol or ibuprofen for mild pain, use Norco for more severe pain. Norco may cause drowsiness, please use with caution. Please return to the emergency department if you have high fever, or the infection seems to be expanding.

## 2013-10-22 ENCOUNTER — Other Ambulatory Visit: Payer: Medicaid Other

## 2013-11-14 ENCOUNTER — Encounter: Payer: Self-pay | Admitting: Cardiology

## 2013-11-14 ENCOUNTER — Ambulatory Visit (INDEPENDENT_AMBULATORY_CARE_PROVIDER_SITE_OTHER): Payer: Medicaid Other | Admitting: Cardiology

## 2013-11-14 ENCOUNTER — Other Ambulatory Visit: Payer: Medicaid Other

## 2013-11-14 VITALS — BP 138/100 | HR 60 | Ht 68.0 in | Wt 161.8 lb

## 2013-11-14 DIAGNOSIS — I2510 Atherosclerotic heart disease of native coronary artery without angina pectoris: Secondary | ICD-10-CM

## 2013-11-14 DIAGNOSIS — F17200 Nicotine dependence, unspecified, uncomplicated: Secondary | ICD-10-CM

## 2013-11-14 DIAGNOSIS — I252 Old myocardial infarction: Secondary | ICD-10-CM

## 2013-11-14 DIAGNOSIS — I2589 Other forms of chronic ischemic heart disease: Secondary | ICD-10-CM

## 2013-11-14 DIAGNOSIS — I10 Essential (primary) hypertension: Secondary | ICD-10-CM

## 2013-11-14 DIAGNOSIS — I255 Ischemic cardiomyopathy: Secondary | ICD-10-CM

## 2013-11-14 MED ORDER — CARVEDILOL 6.25 MG PO TABS
6.2500 mg | ORAL_TABLET | Freq: Two times a day (BID) | ORAL | Status: DC
Start: 2013-11-14 — End: 2014-05-26

## 2013-11-14 NOTE — Patient Instructions (Signed)
Your physician has recommended you make the following change in your medication:   1. Stop Metoprolol 2. Start Carvedilol 6.25mg   Twice daily  Your physician recommends that you schedule a follow-up appointment in: 1 month with Dr. Marlou Porch.

## 2013-11-14 NOTE — Progress Notes (Signed)
[facility] [address], [address] [place],   [address] Phone: [phone] Fax:  [phone]  Date:  11/14/2013   ID:  Ruben Young, DOB 1951/05/02, MRN [id number]  PCP:  Maggie Font, MD   History of Present Illness: Ruben Young is a 63 y.o. male with prior anterior wall myocardial infarction on 09/24/12 with bare-metal stent to LAD and previously placed RCA stent, EF 40% here for followup  He is had difficulty with his hypertension. He states that one of his medications was not covered by insurance. Perhaps this was the metoprolol extended release. I'm going to change to carvedilol.  He has some mild shortness of breath with activity, NYHA class II at the most. He still continues to smoke. No chest pain.  Wt Readings from Last 3 Encounters:  11/14/13 161 lb 12.8 oz (73.392 kg)  10/12/13 158 lb (71.668 kg)  08/05/13 156 lb (70.761 kg)     Past Medical History  Diagnosis Date  . MI (myocardial infarction)   . DDD (degenerative disc disease), lumbar   . Hypertension   . Hyperlipidemia   . Old myocardial infarction   . Tobacco use disorder     Past Surgical History  Procedure Laterality Date  . Tonsillectomy    . Coronary angioplasty with stent placement      Current Outpatient Prescriptions  Medication Sig Dispense Refill  . aspirin 81 MG EC tablet Take 4 tablets (325 mg total) by mouth daily.  30 tablet  12  . clopidogrel (PLAVIX) 75 MG tablet Take 75 mg by mouth daily with breakfast.      . losartan (COZAAR) 100 MG tablet Take 1 tablet (100 mg total) by mouth daily.  30 tablet  3  . metoprolol succinate (TOPROL-XL) 50 MG 24 hr tablet Take 1 tablet (50 mg total) by mouth daily. Take with or immediately following a meal.  30 tablet  3  . nitroGLYCERIN (NITROSTAT) 0.4 MG SL tablet Place 0.4 mg under the tongue every 5 (five) minutes as needed. For chest pains       No current facility-administered medications for this visit.    Allergies:   No  Known Allergies  Social History:  The patient  reports that he has been smoking Cigarettes.  He has a 4.4 pack-year smoking history. He has never used smokeless tobacco. He reports that he uses illicit drugs. He reports that he does not drink alcohol.   ROS:  Please see the history of present illness.   No CP, no bleeding.     PHYSICAL EXAM: VS:  BP 138/100  Pulse 60  Ht 5\' 8"  (1.727 m)  Wt 161 lb 12.8 oz (73.392 kg)  BMI 24.61 kg/m2 Well nourished, well developed, in no acute distress HEENT: normal Neck: no JVD Cardiac:  normal S1, S2; RRR; no murmur Lungs:  clear to auscultation bilaterally, no wheezing, rhonchi or rales Abd: soft, nontender, no hepatomegaly Ext: no edema Skin: warm and dry Neuro: no focal abnormalities noted  EKG:  Sinus rhythm rate 68, no other significant abnormalities.  Labs: 10/12/13-creatinine 1.1, potassium 3.8    ASSESSMENT AND PLAN:  1. Old myocardial infarction-anterior ST elevation myocardial infarction on 09/24/12. PCI to LAD, bare-metal stent. Previously widely patent stent in the mid right coronary artery. Ejection fraction at that time 40%. Mid to distal anteroseptal wall akinetic/dyskinetic. 2. CAD-LAD stent in the setting of anterior wall myocardial infarction, previous RCA stent widely patent. Continuing with  both Plavix as well as aspirin. If he needs to come off Plavix for an orthopedic procedure, this is fine at this point. 3. Ischemic cardiomyopathy-ejection fraction 40% previously. NYHA class 2. Medications, beta blocker, ACE inhibitor. 4. Hypertension-currently uncontrolled. He states that his insurance was unable to give him one of his medications. Perhaps this was the metoprolol extended release. I will give him carvedilol 6.25 mg twice a day. Continue with losartan 100 mg once a day. Medication compliance has been an issue. He understands the risks of not taking his medications, hypertension, renal impairment, stroke, death. He has not had his  medications.  5. Tobacco cessation discussed. 6. Return to clinic in 4 weeks for blood pressure evaluation visit.  Signed, Candee Furbish, MD [facility]  11/14/2013 8:49 AM

## 2013-12-17 ENCOUNTER — Encounter (INDEPENDENT_AMBULATORY_CARE_PROVIDER_SITE_OTHER): Payer: Self-pay

## 2013-12-17 ENCOUNTER — Ambulatory Visit (INDEPENDENT_AMBULATORY_CARE_PROVIDER_SITE_OTHER): Payer: Medicaid Other | Admitting: Cardiology

## 2013-12-17 ENCOUNTER — Encounter: Payer: Self-pay | Admitting: Cardiology

## 2013-12-17 VITALS — BP 160/100 | HR 57 | Ht 68.0 in | Wt 152.0 lb

## 2013-12-17 DIAGNOSIS — I2510 Atherosclerotic heart disease of native coronary artery without angina pectoris: Secondary | ICD-10-CM

## 2013-12-17 DIAGNOSIS — I10 Essential (primary) hypertension: Secondary | ICD-10-CM

## 2013-12-17 DIAGNOSIS — I252 Old myocardial infarction: Secondary | ICD-10-CM

## 2013-12-17 DIAGNOSIS — I2589 Other forms of chronic ischemic heart disease: Secondary | ICD-10-CM

## 2013-12-17 DIAGNOSIS — F17200 Nicotine dependence, unspecified, uncomplicated: Secondary | ICD-10-CM

## 2013-12-17 DIAGNOSIS — I255 Ischemic cardiomyopathy: Secondary | ICD-10-CM

## 2013-12-17 MED ORDER — AMLODIPINE BESYLATE 5 MG PO TABS: 5.0000 mg | ORAL_TABLET | Freq: Every day | ORAL | Status: DC

## 2013-12-17 NOTE — Progress Notes (Signed)
[facility] [address] [place], [place]  [address] Phone: [phone] Fax:  [phone]  Date:  12/17/2013   ID:  Ruben Young, DOB [birth year], MRN [id number]  PCP:  Maggie Font, MD   History of Present Illness: Ruben Young is a 63 y.o. male with prior anterior wall myocardial infarction on 09/24/12 with bare-metal stent to LAD and previously placed RCA stent, EF 40% here for followup  He is had difficulty with his hypertension. Has had issues in the past with compliance. Denies any strokelike symptoms.  He has some mild shortness of breath with activity, NYHA class II at the most. He still continues to smoke. No chest pain.  Wt Readings from Last 3 Encounters:  12/17/13 152 lb (68.947 kg)  11/14/13 161 lb 12.8 oz (73.392 kg)  10/12/13 158 lb (71.668 kg)     Past Medical History  Diagnosis Date  . MI (myocardial infarction)   . DDD (degenerative disc disease), lumbar   . Hypertension   . Hyperlipidemia   . Old myocardial infarction   . Tobacco use disorder     Past Surgical History  Procedure Laterality Date  . Tonsillectomy    . Coronary angioplasty with stent placement      Current Outpatient Prescriptions  Medication Sig Dispense Refill  . aspirin 81 MG EC tablet Take 4 tablets (325 mg total) by mouth daily.  30 tablet  12  . carvedilol (COREG) 6.25 MG tablet Take 1 tablet (6.25 mg total) by mouth 2 (two) times daily.  60 tablet  3  . losartan (COZAAR) 100 MG tablet Take 1 tablet (100 mg total) by mouth daily.  30 tablet  3  . nitroGLYCERIN (NITROSTAT) 0.4 MG SL tablet Place 0.4 mg under the tongue every 5 (five) minutes as needed. For chest pains      . clopidogrel (PLAVIX) 75 MG tablet PT IS OUT OF PLAVIX       No current facility-administered medications for this visit.    Allergies:   No Known Allergies  Social History:  The patient  reports that he has been smoking Cigarettes.  He has a 4.4 pack-year smoking history. He has never  used smokeless tobacco. He reports that he uses illicit drugs. He reports that he does not drink alcohol.   ROS:  Please see the history of present illness.   No CP, no bleeding.     PHYSICAL EXAM: VS:  BP 160/100  Pulse 57  Ht 5\' 8"  (1.727 m)  Wt 152 lb (68.947 kg)  BMI 23.12 kg/m2 Well nourished, well developed, in no acute distress HEENT: normal Neck: no JVD Cardiac:  normal S1, S2; RRR; no murmur Lungs:  clear to auscultation bilaterally, no wheezing, rhonchi or rales Abd: soft, nontender, no hepatomegaly Ext: no edema Skin: warm and dry Neuro: no focal abnormalities noted  EKG:  Sinus rhythm rate 68, no other significant abnormalities.  Labs: 10/12/13-creatinine 1.1, potassium 3.8    ASSESSMENT AND PLAN:  1. Old myocardial infarction-anterior ST elevation myocardial infarction on 09/24/12. PCI to LAD, bare-metal stent. Previously widely patent stent in the mid right coronary artery. Ejection fraction at that time 40%. Mid to distal anteroseptal wall akinetic/dyskinetic. 2. CAD-LAD stent in the setting of anterior wall myocardial infarction, previous RCA stent widely patent. Continuing with aspirin. Okay at this point to discontinue Plavix. He is actually stopped this in anticipation of orthopedic procedure. Dr. Magnus Ivan. 3. Ischemic cardiomyopathy-ejection fraction 40% previously.  NYHA class 2. Medications, beta blocker, ACE inhibitor. 4. Hypertension-currently challenging control. I will start amlodipine 5 mg once a day. Continue Carvedilol 6.25 mg twice a day. Cannot increase carvedilol do to bradycardia at this point. Continue with losartan 100 mg once a day. Medication compliance has been an issue. He understands the risks of not taking his medications, hypertension, renal impairment, stroke, death.  5. Tobacco cessation discussed. 6. Return to clinic in 4 weeks for blood pressure evaluation visit.  Signed, Candee Furbish, MD [facility]  12/17/2013 1:57 PM

## 2013-12-17 NOTE — Patient Instructions (Signed)
Your physician has recommended you make the following change in your medication:  1)START Amlodipine 5mg  daily. An Rx has been sent to your pharmacy  Your physician recommends that you schedule a follow-up appointment in: 1 month with the PA or NP

## 2014-01-16 ENCOUNTER — Ambulatory Visit: Payer: Medicaid Other | Admitting: Physician Assistant

## 2014-02-10 ENCOUNTER — Encounter: Payer: Self-pay | Admitting: Physician Assistant

## 2014-02-10 ENCOUNTER — Ambulatory Visit (INDEPENDENT_AMBULATORY_CARE_PROVIDER_SITE_OTHER): Payer: Medicaid Other | Admitting: Physician Assistant

## 2014-02-10 ENCOUNTER — Encounter (INDEPENDENT_AMBULATORY_CARE_PROVIDER_SITE_OTHER): Payer: Self-pay

## 2014-02-10 VITALS — BP 148/90 | HR 57 | Ht 68.0 in | Wt 150.0 lb

## 2014-02-10 DIAGNOSIS — E785 Hyperlipidemia, unspecified: Secondary | ICD-10-CM

## 2014-02-10 DIAGNOSIS — F17200 Nicotine dependence, unspecified, uncomplicated: Secondary | ICD-10-CM

## 2014-02-10 DIAGNOSIS — I10 Essential (primary) hypertension: Secondary | ICD-10-CM

## 2014-02-10 DIAGNOSIS — I2589 Other forms of chronic ischemic heart disease: Secondary | ICD-10-CM

## 2014-02-10 DIAGNOSIS — I252 Old myocardial infarction: Secondary | ICD-10-CM

## 2014-02-10 DIAGNOSIS — I255 Ischemic cardiomyopathy: Secondary | ICD-10-CM

## 2014-02-10 DIAGNOSIS — I2510 Atherosclerotic heart disease of native coronary artery without angina pectoris: Secondary | ICD-10-CM

## 2014-02-10 MED ORDER — AMLODIPINE BESYLATE 10 MG PO TABS: 10.0000 mg | ORAL_TABLET | Freq: Every day | ORAL | Status: DC

## 2014-02-10 MED ORDER — ATORVASTATIN CALCIUM 40 MG PO TABS: 40.0000 mg | ORAL_TABLET | Freq: Every day | ORAL | Status: DC

## 2014-02-10 NOTE — Patient Instructions (Signed)
Increase Amlodipine to 10 mg daily (for blood pressure).  A new prescription was sent to your pharmacy.  Start Atorvastatin 40 mg daily at 6 pm (for cholesterol).  A prescription was sent to your pharmacy.  Return in 2 months for fasting labs:  Lipids, LFTs.  Schedule follow up with Richardson Dopp, PA-C  In 3 months.

## 2014-02-10 NOTE — Progress Notes (Signed)
Cardiology Office Note    Date:  02/10/2014   ID:  Ruben Young, DOB Aug 04, 1950, MRN [id number]  PCP:  Maggie Font, MD  Cardiologist:  Dr. Candee Furbish      History of Present Illness: Ruben Young is a 63 y.o. male with a hx of CAD, s/p PCI to RCA in the past, anterior STEMI in 08/2012 tx with BMS to LAD, ischemic CM, HTN, HL.  Last seen by Dr. Candee Furbish 11/2013.  BP was elevated.  Medications were adjusted.  He returns for follow up.  He is doing well.  He denies chest pain, syncope, orthopnea, PND, edema.  He notes chronic DOE.  He is NYHA 2-2b.  There have been no changes.     Studies:  - LHC (08/2012):  Ant STEMI >>> mid LAD occluded, mid RCA stent ok.  PCI:  BMS to mid LAD.    - Echo (08/2012):  Mild LVH, EF 40-45%, AS dyskinesis, Gr 1 DD.    Recent Labs: 10/12/2013: Creatinine 1.18; Potassium 3.8   Wt Readings from Last 3 Encounters:  02/10/14 150 lb (68.04 kg)  12/17/13 152 lb (68.947 kg)  11/14/13 161 lb 12.8 oz (73.392 kg)     Past Medical History  Diagnosis Date  . MI (myocardial infarction)   . DDD (degenerative disc disease), lumbar   . Hypertension   . Hyperlipidemia   . Old myocardial infarction   . Tobacco use disorder     Current Outpatient Prescriptions  Medication Sig Dispense Refill  . amLODipine (NORVASC) 5 MG tablet Take 1 tablet (5 mg total) by mouth daily.  30 tablet  11  . aspirin 81 MG EC tablet Take 4 tablets (325 mg total) by mouth daily.  30 tablet  12  . carvedilol (COREG) 6.25 MG tablet Take 1 tablet (6.25 mg total) by mouth 2 (two) times daily.  60 tablet  3  . losartan (COZAAR) 100 MG tablet Take 1 tablet (100 mg total) by mouth daily.  30 tablet  3  . nitroGLYCERIN (NITROSTAT) 0.4 MG SL tablet Place 0.4 mg under the tongue every 5 (five) minutes as needed. For chest pains       No current facility-administered medications for this visit.    Allergies:   Review of patient's allergies indicates no known allergies.    Social History:  The patient  reports that he has been smoking Cigarettes.  He has a 4.4 pack-year smoking history. He has never used smokeless tobacco. He reports that he uses illicit drugs. He reports that he does not drink alcohol.   Family History:  The patient's family history includes Cancer in his mother; Heart attack in his mother.   ROS:  Please see the history of present illness.      All other systems reviewed and negative.   PHYSICAL EXAM: VS:  BP 148/90  Pulse 57  Ht 5\' 8"  (1.727 m)  Wt 150 lb (68.04 kg)  BMI 22.81 kg/m2 Well nourished, well developed, in no acute distress HEENT: normal Neck: no JVD Cardiac:  normal S1, S2; RRR; no murmur Lungs:  clear to auscultation bilaterally, no wheezing, rhonchi or rales Abd: soft, nontender, no hepatomegaly Ext: no edema Skin: warm and dry Neuro:  CNs 2-12 intact, no focal abnormalities noted  EKG:  Sinus brady, HR 57, normal axis, PAC     ASSESSMENT AND PLAN:  1. Essential hypertension, benign:  BP improved.  However, is not to target yet.  Increase  Amlodipine to 10 mg QD.   2. Atherosclerosis of native coronary artery of native heart without angina pectoris:  No angina.  Continue ASA.  He is not on statin Rx for unclear reasons.  Continue beta blocker.  3. Old myocardial infarction:  Continue beta blocker, ASA. 4. Cardiomyopathy, ischemic:  Continue ARB, beta blocker.  Could consider Spironolactone if BP remains uncontrolled.  5. Tobacco use disorder:  I have recommended cessation.  6. HLD (hyperlipidemia) - Plan: Start atorvastatin (LIPITOR) 40 MG QD.  Check Lipids and LFTs in 2 mos. 7. Disposition:  F/u with me in 3 mos.    Signed, Versie Starks, MHS 02/10/2014 4:10 PM    [facility] [place], [place], [place]  [address] Phone: [phone]; Fax: [phone]

## 2014-02-24 ENCOUNTER — Encounter: Payer: Self-pay | Admitting: Family

## 2014-02-25 ENCOUNTER — Ambulatory Visit (INDEPENDENT_AMBULATORY_CARE_PROVIDER_SITE_OTHER): Payer: Medicaid Other | Admitting: Family

## 2014-02-25 ENCOUNTER — Ambulatory Visit: Payer: Medicaid Other | Admitting: Vascular Surgery

## 2014-02-25 ENCOUNTER — Encounter: Payer: Self-pay | Admitting: Family

## 2014-02-25 ENCOUNTER — Other Ambulatory Visit (HOSPITAL_COMMUNITY): Payer: Medicaid Other

## 2014-02-25 ENCOUNTER — Ambulatory Visit (HOSPITAL_COMMUNITY)
Admission: RE | Admit: 2014-02-25 | Discharge: 2014-02-25 | Disposition: A | Discharge status: Home / Self Care | Payer: Medicaid Other | Source: Ambulatory Visit | Attending: Family | Admitting: Family

## 2014-02-25 VITALS — BP 119/79 | HR 58 | Resp 16 | Ht 68.0 in | Wt 147.0 lb

## 2014-02-25 DIAGNOSIS — I723 Aneurysm of iliac artery: Secondary | ICD-10-CM | POA: Insufficient documentation

## 2014-02-25 DIAGNOSIS — Z48812 Encounter for surgical aftercare following surgery on the circulatory system: Secondary | ICD-10-CM
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
## 2014-02-25 NOTE — Patient Instructions (Signed)
Smoking Cessation Quitting smoking is important to your health and has many advantages. However, it is not always easy to quit since nicotine is a very addictive drug. Oftentimes, people try 3 times or more before being able to quit. This document explains the best ways for you to prepare to quit smoking. Quitting takes hard work and a lot of effort, but you can do it. ADVANTAGES OF QUITTING SMOKING  You will live longer, feel better, and live better.  Your body will feel the impact of quitting smoking almost immediately.  Within 20 minutes, blood pressure decreases. Your pulse returns to its normal level.  After 8 hours, carbon monoxide levels in the blood return to normal. Your oxygen level increases.  After 24 hours, the chance of having a heart attack starts to decrease. Your breath, hair, and body stop smelling like smoke.  After 48 hours, damaged nerve endings begin to recover. Your sense of taste and smell improve.  After 72 hours, the body is virtually free of nicotine. Your bronchial tubes relax and breathing becomes easier.  After 2 to 12 weeks, lungs can hold more air. Exercise becomes easier and circulation improves.  The risk of having a heart attack, stroke, cancer, or lung disease is greatly reduced.  After 1 year, the risk of coronary heart disease is cut in half.  After 5 years, the risk of stroke falls to the same as a nonsmoker.  After 10 years, the risk of lung cancer is cut in half and the risk of other cancers decreases significantly.  After 15 years, the risk of coronary heart disease drops, usually to the level of a nonsmoker.  If you are pregnant, quitting smoking will improve your chances of having a healthy baby.  The people you live with, especially any children, will be healthier.  You will have extra money to spend on things other than cigarettes. QUESTIONS TO THINK ABOUT BEFORE ATTEMPTING TO QUIT You may want to talk about your answers with your  health care provider.  Why do you want to quit?  If you tried to quit in the past, what helped and what did not?  What will be the most difficult situations for you after you quit? How will you plan to handle them?  Who can help you through the tough times? Your family? Friends? A health care provider?  What pleasures do you get from smoking? What ways can you still get pleasure if you quit? Here are some questions to ask your health care provider:  How can you help me to be successful at quitting?  What medicine do you think would be best for me and how should I take it?  What should I do if I need more help?  What is smoking withdrawal like? How can I get information on withdrawal? GET READY  Set a quit date.  Change your environment by getting rid of all cigarettes, ashtrays, matches, and lighters in your home, car, or work. Do not let people smoke in your home.  Review your past attempts to quit. Think about what worked and what did not. GET SUPPORT AND ENCOURAGEMENT You have a better chance of being successful if you have help. You can get support in many ways.  Tell your family, friends, and coworkers that you are going to quit and need their support. Ask them not to smoke around you.  Get individual, group, or telephone counseling and support. Programs are available at local hospitals and health centers. Call   your local health department for information about programs in your area.  Spiritual beliefs and practices may help some smokers quit.  Download a "quit meter" on your computer to keep track of quit statistics, such as how long you have gone without smoking, cigarettes not smoked, and money saved.  Get a self-help book about quitting smoking and staying off tobacco. LEARN NEW SKILLS AND BEHAVIORS  Distract yourself from urges to smoke. Talk to someone, go for a walk, or occupy your time with a task.  Change your normal routine. Take a different route to work.  Drink tea instead of coffee. Eat breakfast in a different place.  Reduce your stress. Take a hot bath, exercise, or read a book.  Plan something enjoyable to do every day. Reward yourself for not smoking.  Explore interactive web-based programs that specialize in helping you quit. GET MEDICINE AND USE IT CORRECTLY Medicines can help you stop smoking and decrease the urge to smoke. Combining medicine with the above behavioral methods and support can greatly increase your chances of successfully quitting smoking.  Nicotine replacement therapy helps deliver nicotine to your body without the negative effects and risks of smoking. Nicotine replacement therapy includes nicotine gum, lozenges, inhalers, nasal sprays, and skin patches. Some may be available over-the-counter and others require a prescription.  Antidepressant medicine helps people abstain from smoking, but how this works is unknown. This medicine is available by prescription.  Nicotinic receptor partial agonist medicine simulates the effect of nicotine in your brain. This medicine is available by prescription. Ask your health care provider for advice about which medicines to use and how to use them based on your health history. Your health care provider will tell you what side effects to look out for if you choose to be on a medicine or therapy. Carefully read the information on the package. Do not use any other product containing nicotine while using a nicotine replacement product.  RELAPSE OR DIFFICULT SITUATIONS Most relapses occur within the first 3 months after quitting. Do not be discouraged if you start smoking again. Remember, most people try several times before finally quitting. You may have symptoms of withdrawal because your body is used to nicotine. You may crave cigarettes, be irritable, feel very hungry, cough often, get headaches, or have difficulty concentrating. The withdrawal symptoms are only temporary. They are strongest  when you first quit, but they will go away within 10-14 days. To reduce the chances of relapse, try to:  Avoid drinking alcohol. Drinking lowers your chances of successfully quitting.  Reduce the amount of caffeine you consume. Once you quit smoking, the amount of caffeine in your body increases and can give you symptoms, such as a rapid heartbeat, sweating, and anxiety.  Avoid smokers because they can make you want to smoke.  Do not let weight gain distract you. Many smokers will gain weight when they quit, usually less than 10 pounds. Eat a healthy diet and stay active. You can always lose the weight gained after you quit.  Find ways to improve your mood other than smoking. FOR MORE INFORMATION  www.smokefree.gov  Document Released: 07/11/2001 Document Revised: 12/01/2013 Document Reviewed: 10/26/2011 ExitCare Patient Information 2015 [employer]. This information is not intended to replace advice given to you by your health care provider. Make sure you discuss any questions you have with your health care provider.  

## 2014-02-25 NOTE — Addendum Note (Signed)
Addended by: Mena Goes on: 02/25/2014 10:40 AM   Modules accepted: Orders

## 2014-03-19 ENCOUNTER — Encounter (HOSPITAL_COMMUNITY): Payer: Self-pay | Admitting: Emergency Medicine

## 2014-03-19 ENCOUNTER — Emergency Department (HOSPITAL_COMMUNITY)
Admission: EM | Admit: 2014-03-19 | Discharge: 2014-03-19 | Disposition: A | Discharge status: Home / Self Care | Payer: Medicaid Other | Attending: Emergency Medicine | Admitting: Emergency Medicine

## 2014-03-19 DIAGNOSIS — F17200 Nicotine dependence, unspecified, uncomplicated: Secondary | ICD-10-CM | POA: Diagnosis not present

## 2014-03-19 DIAGNOSIS — Z9861 Coronary angioplasty status: Secondary | ICD-10-CM | POA: Diagnosis not present

## 2014-03-19 DIAGNOSIS — Z8739 Personal history of other diseases of the musculoskeletal system and connective tissue: Secondary | ICD-10-CM | POA: Insufficient documentation

## 2014-03-19 DIAGNOSIS — B372 Candidiasis of skin and nail: Secondary | ICD-10-CM | POA: Insufficient documentation

## 2014-03-19 DIAGNOSIS — R21 Rash and other nonspecific skin eruption: Secondary | ICD-10-CM | POA: Insufficient documentation

## 2014-03-19 DIAGNOSIS — I252 Old myocardial infarction: Secondary | ICD-10-CM | POA: Insufficient documentation

## 2014-03-19 DIAGNOSIS — Z7982 Long term (current) use of aspirin: Secondary | ICD-10-CM | POA: Insufficient documentation

## 2014-03-19 DIAGNOSIS — I10 Essential (primary) hypertension: Secondary | ICD-10-CM | POA: Diagnosis not present

## 2014-03-19 DIAGNOSIS — E785 Hyperlipidemia, unspecified: Secondary | ICD-10-CM | POA: Insufficient documentation

## 2014-03-19 DIAGNOSIS — Z79899 Other long term (current) drug therapy: Secondary | ICD-10-CM | POA: Insufficient documentation

## 2014-03-19 MED ORDER — FLUCONAZOLE 100 MG PO TABS
200.0000 mg | ORAL_TABLET | Freq: Once | ORAL | Status: AC
  Administered 2014-03-19: 200 mg via ORAL

## 2014-03-19 MED ORDER — CLOTRIMAZOLE 1 % EX CREA: TOPICAL_CREAM | CUTANEOUS | Status: DC

## 2014-03-19 MED ORDER — FLUCONAZOLE 100 MG PO TABS
ORAL_TABLET | ORAL | Status: AC
  Filled 2014-03-19: qty 2

## 2014-03-19 NOTE — Discharge Instructions (Signed)
Cutaneous Candidiasis Cutaneous candidiasis is a condition in which there is an overgrowth of yeast (candida) on the skin. Yeast normally live on the skin, but in small enough numbers not to cause any symptoms. In certain cases, increased growth of the yeast may cause an actual yeast infection. This kind of infection usually occurs in areas of the skin that are constantly warm and moist, such as the armpits or the groin. Yeast is the most common cause of diaper rash in babies and in people who cannot control their bowel movements (incontinence). CAUSES  The fungus that most often causes cutaneous candidiasis is Candida albicans. Conditions that can increase the risk of getting a yeast infection of the skin include:  Obesity.  Pregnancy.  Diabetes.  Taking antibiotic medicine.  Taking birth control pills.  Taking steroid medicines.  Thyroid disease.  An iron or zinc deficiency.  Problems with the immune system. SYMPTOMS   Red, swollen area of the skin.  Bumps on the skin.  Itchiness. DIAGNOSIS  The diagnosis of cutaneous candidiasis is usually based on its appearance. Light scrapings of the skin may also be taken and viewed under a microscope to identify the presence of yeast. TREATMENT  Antifungal creams may be applied to the infected skin. In severe cases, oral medicines may be needed.  HOME CARE INSTRUCTIONS   Keep your skin clean and dry.  Maintain a healthy weight.  If you have diabetes, keep your blood sugar under control. SEEK IMMEDIATE MEDICAL CARE IF:  Your rash continues to spread despite treatment.  You have a fever, chills, or abdominal pain. Document Released: 04/04/2011 Document Revised: 10/09/2011 Document Reviewed: 04/04/2011 ExitCare Patient Information 2015 [employer]. This information is not intended to replace advice given to you by your health care provider. Make sure you discuss any questions you have with your health care provider.  

## 2014-03-19 NOTE — ED Notes (Addendum)
Pt reports history of abscess/boil. Pt reports right groin itching/drainage that started 1 week ago. Pt denies any fevers/n/v.

## 2014-03-21 NOTE — ED Provider Notes (Signed)
CSN: [id number]     Arrival date & time 03/19/14  1022 History   First MD Initiated Contact with Patient 03/19/14 1046     Chief Complaint  Patient presents with  . Rash     (Consider location/radiation/quality/duration/timing/severity/associated sxs/prior Treatment) Patient is a 63 y.o. male presenting with rash.  Rash Associated symptoms: no abdominal pain, no fever, no headaches, no nausea, no shortness of breath, no sore throat, not vomiting and not wheezing     Ruben Young is a 63 y.o. male who presents to the Emergency Department complaining of painful , weeping rash to the right groin for one week.  He also reports occasional itching to same area.  He denies swelling, abdominal pain, penile swelling or discharge, fever, chills or vomiting.  He has not tried any therapies prior to Ed arrival   Past Medical History  Diagnosis Date  . MI (myocardial infarction)   . DDD (degenerative disc disease), lumbar   . Hypertension   . Hyperlipidemia   . Old myocardial infarction   . Tobacco use disorder    Past Surgical History  Procedure Laterality Date  . Tonsillectomy    . Coronary angioplasty with stent placement     Family History  Problem Relation Age of Onset  . Cancer Mother   . Heart attack Mother    History  Substance Use Topics  . Smoking status: Light Tobacco Smoker -- 0.10 packs/day for 44 years    Types: Cigarettes  . Smokeless tobacco: Never Used  . Alcohol Use: No    Review of Systems  Constitutional: Negative for fever, chills, activity change and appetite change.  HENT: Negative for facial swelling, sore throat and trouble swallowing.   Respiratory: Negative for chest tightness, shortness of breath and wheezing.   Gastrointestinal: Negative for nausea, vomiting and abdominal pain.  Genitourinary: Negative for dysuria, frequency, hematuria, flank pain, discharge, penile swelling, scrotal swelling, genital sores and testicular pain.  Musculoskeletal:  Negative for neck pain and neck stiffness.  Skin: Positive for rash. Negative for wound.  Neurological: Negative for dizziness, weakness, numbness and headaches.  All other systems reviewed and are negative.     Allergies  Review of patient's allergies indicates no known allergies.  Home Medications   Prior to Admission medications   Medication Sig Start Date End Date Taking? Authorizing Provider  amLODipine (NORVASC) 10 MG tablet Take 1 tablet (10 mg total) by mouth daily. 02/10/14  Yes Liliane Shi, PA-C  aspirin EC 81 MG tablet Take 81 mg by mouth daily.   Yes Historical Provider, MD  atorvastatin (LIPITOR) 40 MG tablet Take 1 tablet (40 mg total) by mouth daily at 6 PM. 02/10/14  Yes Scott T Kathlen Mody, PA-C  carvedilol (COREG) 6.25 MG tablet Take 1 tablet (6.25 mg total) by mouth 2 (two) times daily. 11/14/13  Yes Candee Furbish, MD  losartan (COZAAR) 100 MG tablet Take 1 tablet (100 mg total) by mouth daily. 10/08/13  Yes Candee Furbish, MD  nitroGLYCERIN (NITROSTAT) 0.4 MG SL tablet Place 0.4 mg under the tongue every 5 (five) minutes as needed. For chest pains   Yes Historical Provider, MD  clotrimazole (LOTRIMIN) 1 % cream Apply to affected area 2 times daily 03/19/14   Gwyneth Fernandez L. Allison Silva, PA-C   BP 118/92  Pulse 65  Temp(Src) 98.1 F (36.7 C) (Oral)  Resp 18  Ht 5\' 8"  (1.727 m)  Wt 151 lb (68.493 kg)  BMI 22.96 kg/m2  SpO2 99% Physical Exam  Nursing note and vitals reviewed. Constitutional: He is oriented to person, place, and time. He appears well-developed and well-nourished. No distress.  HENT:  Head: Normocephalic and atraumatic.  Cardiovascular: Normal rate, regular rhythm, normal heart sounds and intact distal pulses.   No murmur heard. Pulmonary/Chest: Effort normal and breath sounds normal. No respiratory distress.  Abdominal: Soft. He exhibits no distension and no mass. There is no tenderness. There is no rebound and no guarding.  Musculoskeletal: Normal range of motion.   Neurological: He is alert and oriented to person, place, and time. He exhibits normal muscle tone. Coordination normal.  Skin: Skin is warm and dry. No erythema.  Erythematous weeping rash to the right groin.  No abscess, fluctuance or induration.  No vesicles or pustules.      ED Course  Procedures (including critical care time) Labs Review Labs Reviewed - No data to display  Imaging Review No results found.   EKG Interpretation None      MDM   Final diagnoses:  Candidal intertrigo    Pt is well appearing. VSS.  No abscess on exam today.  Rash to the groin appears c/w candida infection.  Will treat with diflucan and clotrimazole cream.  Pt advised to keep the area clean and dry as possible.  He agrees to f/u with his PMD or return to ER if sx's worsening    Dezarai Prew L. Vanessa , PA-C 03/21/14 1512

## 2014-03-23 NOTE — ED Provider Notes (Signed)
Medical screening examination/treatment/procedure(s) were performed by non-physician practitioner and as supervising physician I was immediately available for consultation/collaboration.   EKG Interpretation None        Maudry Diego, MD 03/23/14 [phone]

## 2014-04-14 ENCOUNTER — Other Ambulatory Visit (INDEPENDENT_AMBULATORY_CARE_PROVIDER_SITE_OTHER): Payer: Medicaid Other

## 2014-04-14 DIAGNOSIS — E7800 Pure hypercholesterolemia, unspecified: Secondary | ICD-10-CM

## 2014-04-14 LAB — HEPATIC FUNCTION PANEL
ALT: 20 U/L (ref 0–53)
AST: 19 U/L (ref 0–37)
Albumin: 2.8 g/dL — ABNORMAL LOW (ref 3.5–5.2)
Alkaline Phosphatase: 64 U/L (ref 39–117)
BILIRUBIN TOTAL: 0.6 mg/dL (ref 0.2–1.2)
Bilirubin, Direct: 0.1 mg/dL (ref 0.0–0.3)
TOTAL PROTEIN: 8.5 g/dL — AB (ref 6.0–8.3)

## 2014-04-14 LAB — LIPID PANEL
CHOLESTEROL: 104 mg/dL (ref 0–200)
HDL: 29.7 mg/dL — ABNORMAL LOW (ref >39.00)
LDL Cholesterol: 63 mg/dL (ref 0–99)
NonHDL: 74.3
TRIGLYCERIDES: 58 mg/dL (ref 0.0–149.0)
Total CHOL/HDL Ratio: 4
VLDL: 11.6 mg/dL (ref 0.0–40.0)

## 2014-05-13 ENCOUNTER — Ambulatory Visit: Payer: Medicaid Other | Admitting: Physician Assistant

## 2014-05-26 ENCOUNTER — Ambulatory Visit (INDEPENDENT_AMBULATORY_CARE_PROVIDER_SITE_OTHER): Payer: Medicaid Other | Admitting: Physician Assistant

## 2014-05-26 ENCOUNTER — Encounter: Payer: Self-pay | Admitting: Physician Assistant

## 2014-05-26 VITALS — BP 170/78 | HR 72 | Ht 68.0 in | Wt 156.0 lb

## 2014-05-26 DIAGNOSIS — E785 Hyperlipidemia, unspecified: Secondary | ICD-10-CM

## 2014-05-26 DIAGNOSIS — I10 Essential (primary) hypertension: Secondary | ICD-10-CM

## 2014-05-26 DIAGNOSIS — I255 Ischemic cardiomyopathy: Secondary | ICD-10-CM

## 2014-05-26 DIAGNOSIS — F17200 Nicotine dependence, unspecified, uncomplicated: Secondary | ICD-10-CM

## 2014-05-26 DIAGNOSIS — Z720 Tobacco use: Secondary | ICD-10-CM

## 2014-05-26 DIAGNOSIS — I2510 Atherosclerotic heart disease of native coronary artery without angina pectoris: Secondary | ICD-10-CM

## 2014-05-26 MED ORDER — CARVEDILOL 6.25 MG PO TABS: 6.2500 mg | ORAL_TABLET | Freq: Two times a day (BID) | ORAL | Status: DC

## 2014-05-26 MED ORDER — LOSARTAN POTASSIUM 100 MG PO TABS: 100.0000 mg | ORAL_TABLET | Freq: Every day | ORAL | Status: DC

## 2014-05-26 NOTE — Progress Notes (Signed)
Cardiology Office Note   Date:  05/26/2014   ID:  Ruben Young, DOB August 20, 1950, MRN [id number]  PCP:  Maggie Font, MD  Cardiologist:  Dr. Candee Furbish      History of Present Illness: Ruben Young is a 63 y.o. male with a hx of CAD, s/p PCI to RCA in the past, anterior STEMI in 08/2012 tx with BMS to LAD, ischemic CM, HTN, HL.    I saw him in July for follow-up. I adjusted his amlodipine for improved blood pressure control.  He returns for follow-up. He ran out of his carvedilol and losartan at the beginning of this month. His pharmacy did not refill it for him.The patient denies any chest pain, significant dyspnea, syncope, orthopnea, PND, edema.  He does add salt to his foods but states this is minimal.  He does not eat out a lot.    Studies:  - LHC (08/2012):  Ant STEMI >>> mid LAD occluded, mid RCA stent ok.  PCI:  BMS to mid LAD.    - Echo (08/2012):  Mild LVH, EF 40-45%, AS dyskinesis, Gr 1 DD.   Recent Labs: 10/12/2013: Creatinine 1.18; Potassium 3.8  04/14/2014: ALT 20; HDL Cholesterol by NMR 29.70*; LDL (calc) 63   Wt Readings from Last 3 Encounters:  05/26/14 156 lb (70.761 kg)  03/19/14 151 lb (68.493 kg)  02/25/14 147 lb (66.679 kg)     Past Medical History  Diagnosis Date  . MI (myocardial infarction)   . DDD (degenerative disc disease), lumbar   . Hypertension   . Hyperlipidemia   . Old myocardial infarction   . Tobacco use disorder     Current Outpatient Prescriptions  Medication Sig Dispense Refill  . amLODipine (NORVASC) 10 MG tablet Take 1 tablet (10 mg total) by mouth daily.  30 tablet  11  . aspirin EC 81 MG tablet Take 81 mg by mouth daily.      Marland Kitchen atorvastatin (LIPITOR) 40 MG tablet Take 1 tablet (40 mg total) by mouth daily at 6 PM.  30 tablet  11  . carvedilol (COREG) 6.25 MG tablet Take 1 tablet (6.25 mg total) by mouth 2 (two) times daily.  60 tablet  3  . losartan (COZAAR) 100 MG tablet Take 1 tablet (100 mg total) by mouth daily.   30 tablet  3  . nitroGLYCERIN (NITROSTAT) 0.4 MG SL tablet Place 0.4 mg under the tongue every 5 (five) minutes as needed. For chest pains       No current facility-administered medications for this visit.    Allergies:   Review of patient's allergies indicates no known allergies.   Social History:  The patient  reports that he has been smoking Cigarettes.  He has a 4.4 pack-year smoking history. He has never used smokeless tobacco. He reports that he uses illicit drugs (Cocaine). He reports that he does not drink alcohol.   Family History:  The patient's family history includes Cancer in his mother; Heart attack in his mother.   ROS:  Please see the history of present illness.       All other systems reviewed and negative.   PHYSICAL EXAM: VS:  BP 170/78  Pulse 72  Ht 5\' 8"  (1.727 m)  Wt 156 lb (70.761 kg)  BMI 23.73 kg/m2 Well nourished, well developed, in no acute distress HEENT: normal Neck: no JVD Cardiac:  normal S1, S2; RRR; no murmur Lungs:  clear to auscultation bilaterally, no wheezing, rhonchi or rales  Abd: soft, nontender, no hepatomegaly Ext: no edema Skin: warm and dry Neuro:  CNs 2-12 intact, no focal abnormalities noted  EKG:  NSR, HR 72, normal axis, NSSTTW changes, no change from prior tracing   ASSESSMENT AND PLAN:  1. Essential hypertension, benign:  Uncontrolled.  He has been out of Carvediolol and Losartan for ~ 1 month.  I have refilled both of these for him.  I explained the importance of taking these medications.  I have also advised him to limit his salt more.     2. Atherosclerosis of native coronary artery of native heart without angina pectoris:  No angina.  Continue ASA, statin, beta blocker.  3. Cardiomyopathy, ischemic:  Continue ARB, beta blocker.   4. Tobacco use disorder:  I advised cessation to him again today.  5. HLD (hyperlipidemia):  Recent LDL optimal.  Continue current dose of statin.    Disposition:  FU with me in 4-6 weeks.      Signed, Versie Starks, MHS 05/26/2014 2:49 PM    [facility] [address], [place], [place]  [address] Phone: [phone]; Fax: [phone]

## 2014-05-26 NOTE — Patient Instructions (Addendum)
I refilled Carvedilol and Losartan for you today.  Please pick them up at [facility] in [place] and start back on them right away.  Schedule a follow up with Richardson Dopp, PA-C ON 06/23/14 @ 12:10

## 2014-06-23 ENCOUNTER — Ambulatory Visit: Payer: Medicaid Other | Admitting: Physician Assistant

## 2014-07-09 ENCOUNTER — Encounter (HOSPITAL_COMMUNITY): Payer: Self-pay | Admitting: Interventional Cardiology

## 2014-07-14 ENCOUNTER — Ambulatory Visit (INDEPENDENT_AMBULATORY_CARE_PROVIDER_SITE_OTHER): Payer: PRIVATE HEALTH INSURANCE | Admitting: Physician Assistant

## 2014-07-14 ENCOUNTER — Encounter: Payer: Self-pay | Admitting: Physician Assistant

## 2014-07-14 VITALS — BP 149/87 | HR 69 | Ht 68.0 in | Wt 168.0 lb

## 2014-07-14 DIAGNOSIS — I10 Essential (primary) hypertension: Secondary | ICD-10-CM

## 2014-07-14 DIAGNOSIS — I255 Ischemic cardiomyopathy: Secondary | ICD-10-CM

## 2014-07-14 DIAGNOSIS — E785 Hyperlipidemia, unspecified: Secondary | ICD-10-CM

## 2014-07-14 DIAGNOSIS — I2510 Atherosclerotic heart disease of native coronary artery without angina pectoris: Secondary | ICD-10-CM

## 2014-07-14 DIAGNOSIS — Z720 Tobacco use: Secondary | ICD-10-CM

## 2014-07-14 DIAGNOSIS — F17200 Nicotine dependence, unspecified, uncomplicated: Secondary | ICD-10-CM

## 2014-07-14 NOTE — Patient Instructions (Addendum)
We will call your pharmacy about the Losartan.  Start taking this when you get it.  Return for labs 1-2 weeks after restarting Losartan:  BMET.  Schedule follow up with Dr. Candee Furbish in 3 months. 10/13/14 2:15 PM

## 2014-07-14 NOTE — Progress Notes (Signed)
Cardiology Office Note   Date:  07/14/2014   ID:  Ruben Young, DOB April 09, 1951, MRN [id number]  PCP:  Maggie Font, MD  Cardiologist:  Dr. Candee Furbish     History of Present Illness: Ruben Young is a 63 y.o. male with a hx of CAD, s/p PCI to RCA in the past, anterior STEMI in 08/2012 tx with BMS to LAD, ischemic CM, HTN, HL.   He returns for further management of his HTN.  The patient denies chest pain, shortness of breath, syncope, orthopnea, PND or significant pedal edema.  He is still not taking Losartan.  He tells me that the drug store would not fill it.  He is still smoking.   Studies: - LHC (08/2012): Ant STEMI >>> mid LAD occluded, mid RCA stent ok. PCI: BMS to mid LAD.  - Echo (08/2012): Mild LVH, EF 40-45%, AS dyskinesis, Gr 1 DD.   Recent Labs: 10/12/2013: BUN 10; Creatinine 1.18; Potassium 3.8; Sodium 139 04/14/2014: ALT 20; LDL (calc) 63    Recent Radiology: No results found.    Wt Readings from Last 3 Encounters:  07/14/14 168 lb (76.204 kg)  05/26/14 156 lb (70.761 kg)  03/19/14 151 lb (68.493 kg)     Past Medical History  Diagnosis Date  . MI (myocardial infarction)   . DDD (degenerative disc disease), lumbar   . Hypertension   . Hyperlipidemia   . Old myocardial infarction   . Tobacco use disorder     Current Outpatient Prescriptions  Medication Sig Dispense Refill  . amLODipine (NORVASC) 10 MG tablet Take 1 tablet (10 mg total) by mouth daily. 30 tablet 11  . aspirin 325 MG EC tablet Take 325 mg by mouth daily.    Marland Kitchen atorvastatin (LIPITOR) 40 MG tablet Take 1 tablet (40 mg total) by mouth daily at 6 PM. 30 tablet 11  . carvedilol (COREG) 6.25 MG tablet Take 1 tablet (6.25 mg total) by mouth 2 (two) times daily. 60 tablet 11  . nitroGLYCERIN (NITROSTAT) 0.4 MG SL tablet Place 0.4 mg under the tongue every 5 (five) minutes as needed. For chest pains    . losartan (COZAAR) 100 MG tablet Take 1 tablet (100 mg total) by mouth daily.  (Patient not taking: Reported on 07/14/2014) 30 tablet 11   No current facility-administered medications for this visit.     Allergies:   Review of patient's allergies indicates no known allergies.   Social History:  The patient  reports that he has been smoking Cigarettes.  He has a 4.4 pack-year smoking history. He has never used smokeless tobacco. He reports that he uses illicit drugs (Cocaine). He reports that he does not drink alcohol.   Family History:  The patient's family history includes Cancer in his mother; Heart attack in his mother.    ROS:  Please see the history of present illness.       All other systems reviewed and negative.    PHYSICAL EXAM: VS:  BP 149/87 mmHg  Pulse 69  Ht 5\' 8"  (1.727 m)  Wt 168 lb (76.204 kg)  BMI 25.55 kg/m2  SpO2 95% Well nourished, well developed, in no acute distress HEENT: normal Neck:  no JVD Cardiac:  normal S1, S2;  RRR;  no murmur   Lungs:   clear to auscultation bilaterally, no wheezing, rhonchi or rales Abd: soft, nontender, no hepatomegaly Ext:  no edema Skin: warm and dry Neuro:  CNs 2-12 intact, no focal abnormalities noted  ASSESSMENT AND PLAN:   1.  Hypertension:  BP still elevated.  But, he is still not taking Losartan. He tells me that the pharmacy is not filling it for some reason.  We will contact his pharmacy to fill it.  BP is closer to target.      -  Resume Losartan 100 mg QD.    -  BMET 1-2 weeks after resuming Losartan.  2.  Coronary Artery Disease:  No angina.     -  Continue ASA, statin, beta blocker. 3.  Ischemic Cardiomyopathy:  EF 40-45% in 2014.     -  Continue ARB, beta blocker.   4.  Hyperlipidemia:  Continue statin.   04/14/2014: ALT 20; HDL Cholesterol by NMR 29.70*; LDL (calc) 63   5.  Tobacco Abuse:  He knows he needs to quit.    Disposition:   FU with Dr. Candee Furbish in 3 mos.    Signed, Versie Starks, MHS 07/14/2014 2:34 PM    [address], [place],   [address] Phone: [phone]; Fax: [phone]

## 2014-08-18 ENCOUNTER — Telehealth: Payer: Self-pay | Admitting: Cardiology

## 2014-08-18 NOTE — Telephone Encounter (Signed)
New Msg        Pt calling to find out which blood thinners he is taking.  Pt states he is on two but doesn't know name and dosages.  Please return call.

## 2014-08-18 NOTE — Telephone Encounter (Signed)
Patient is inquiring what blood thinners he has taken in the past. Does remember that Plavix was one of them. Advised that he also took Effient 10 mg post stent placement in January  2015. Only taking ASA 325mg  currently.

## 2014-10-13 ENCOUNTER — Encounter: Payer: Self-pay | Admitting: Cardiology

## 2014-10-13 ENCOUNTER — Ambulatory Visit (INDEPENDENT_AMBULATORY_CARE_PROVIDER_SITE_OTHER): Payer: Medicare Other | Admitting: Cardiology

## 2014-10-13 VITALS — BP 135/84 | HR 62 | Ht 68.0 in | Wt 159.0 lb

## 2014-10-13 DIAGNOSIS — F17200 Nicotine dependence, unspecified, uncomplicated: Secondary | ICD-10-CM

## 2014-10-13 DIAGNOSIS — E785 Hyperlipidemia, unspecified: Secondary | ICD-10-CM

## 2014-10-13 DIAGNOSIS — I10 Essential (primary) hypertension: Secondary | ICD-10-CM

## 2014-10-13 DIAGNOSIS — I252 Old myocardial infarction: Secondary | ICD-10-CM | POA: Diagnosis not present

## 2014-10-13 DIAGNOSIS — Z720 Tobacco use: Secondary | ICD-10-CM | POA: Diagnosis not present

## 2014-10-13 DIAGNOSIS — I2510 Atherosclerotic heart disease of native coronary artery without angina pectoris: Secondary | ICD-10-CM

## 2014-10-13 NOTE — Patient Instructions (Signed)
The current medical regimen is effective;  continue present plan and medications.  Follow up in 6 months with Dr. Skains.  You will receive a letter in the mail 2 months before you are due.  Please call us when you receive this letter to schedule your follow up appointment.  Thank you for choosing [facility]!!     

## 2014-10-13 NOTE — Progress Notes (Signed)
Cardiology Office Note   Date:  10/13/2014   ID:  Ruben Young, DOB September 08, 1950, MRN [id number]  PCP:  Maggie Font, MD  Cardiologist:  Dr. Candee Furbish     History of Present Illness: Ruben Young is a 64 y.o. male with a hx of CAD, s/p PCI to RCA in the past, anterior STEMI in 08/2012 tx with BMS to LAD, ischemic CM, HTN, HL.   He returns for further management of his HTN.  Blood pressure is doing better now that he is back on the losartan. The patient denies chest pain, shortness of breath, syncope, orthopnea, PND or significant pedal edema.   He is still smoking although he is smoking less.   Feels some SOB. 74 year old nephew.   Studies: - LHC (08/2012): Ant STEMI >>> mid LAD occluded, mid RCA stent ok. PCI: BMS to mid LAD.  - Echo (08/2012): Mild LVH, EF 40-45%, AS dyskinesis, Gr 1 DD.   Recent Labs: 04/14/2014: ALT 20; LDL (calc) 63    Recent Radiology: No results found.    Wt Readings from Last 3 Encounters:  10/13/14 159 lb (72.122 kg)  07/14/14 168 lb (76.204 kg)  05/26/14 156 lb (70.761 kg)     Past Medical History  Diagnosis Date  . MI (myocardial infarction)   . DDD (degenerative disc disease), lumbar   . Hypertension   . Hyperlipidemia   . Old myocardial infarction   . Tobacco use disorder     Current Outpatient Prescriptions  Medication Sig Dispense Refill  . amLODipine (NORVASC) 10 MG tablet Take 1 tablet (10 mg total) by mouth daily. 30 tablet 11  . aspirin 325 MG EC tablet Take 325 mg by mouth daily.    Marland Kitchen atorvastatin (LIPITOR) 40 MG tablet Take 1 tablet (40 mg total) by mouth daily at 6 PM. 30 tablet 11  . carvedilol (COREG) 6.25 MG tablet Take 1 tablet (6.25 mg total) by mouth 2 (two) times daily. 60 tablet 11  . nitroGLYCERIN (NITROSTAT) 0.4 MG SL tablet Place 0.4 mg under the tongue every 5 (five) minutes as needed. For chest pains     No current facility-administered medications for this visit.     Allergies:   Review of  patient's allergies indicates no known allergies.   Social History:  The patient  reports that he has been smoking Cigarettes.  He has a 4.4 pack-year smoking history. He has never used smokeless tobacco. He reports that he uses illicit drugs (Cocaine). He reports that he does not drink alcohol.   Family History:  The patient's family history includes Cancer in his mother; Heart attack in his mother.    ROS:  Please see the history of present illness.  In eyes any bleeding, syncope, orthopnea, PND. Positive for some shortness of breath with activity, back pain, noticed what in stool occasionally.     All other systems reviewed and negative.    PHYSICAL EXAM: VS:  BP 135/84 mmHg  Pulse 62  Ht 5\' 8"  (1.727 m)  Wt 159 lb (72.122 kg)  BMI 24.18 kg/m2 Well nourished, well developed, in no acute distress HEENT: normal Neck:  no JVD Cardiac:  normal S1, S2;  RRR;  no murmur   Lungs:   clear to auscultation bilaterally, no wheezing, rhonchi or rales Abd: soft, nontender, no hepatomegaly Ext:  no edema Skin: warm and dry Neuro:  CNs 2-12 intact, no focal abnormalities noted      ASSESSMENT AND PLAN:  1.  Hypertension:  BP at her controlled..   Losartan.    2.  Coronary Artery Disease:  No angina.     -  Continue ASA, statin, beta blocker. 3.  Ischemic Cardiomyopathy:  EF 40-45% in 2014.     -  Continue ARB, beta blocker.   4.  Hyperlipidemia:  Continue statin.   04/14/2014: ALT 20; HDL-C 29.70*; LDL (calc) 63  excellent 5.  Tobacco Abuse:  He knows he needs to quit.  He states that he has cut back significantly.  Disposition:   FU in 82months    Signed, Candee Furbish, MD   10/13/2014 2:50 PM    [facility] [place], [place], [place]  [address] Phone: [phone]; Fax: [phone]

## 2014-11-08 ENCOUNTER — Encounter (HOSPITAL_COMMUNITY): Payer: Self-pay | Admitting: Emergency Medicine

## 2014-11-08 ENCOUNTER — Inpatient Hospital Stay (HOSPITAL_COMMUNITY)
Admission: EM | Admit: 2014-11-08 | Discharge: 2014-11-09 | DRG: 603 | Disposition: A | Discharge status: Home / Self Care | Payer: Medicare Other | Attending: Internal Medicine | Admitting: Internal Medicine

## 2014-11-08 ENCOUNTER — Inpatient Hospital Stay (HOSPITAL_COMMUNITY): Payer: Medicare Other

## 2014-11-08 DIAGNOSIS — E785 Hyperlipidemia, unspecified: Secondary | ICD-10-CM | POA: Diagnosis present

## 2014-11-08 DIAGNOSIS — Z8249 Family history of ischemic heart disease and other diseases of the circulatory system: Secondary | ICD-10-CM | POA: Diagnosis not present

## 2014-11-08 DIAGNOSIS — Z87891 Personal history of nicotine dependence: Secondary | ICD-10-CM

## 2014-11-08 DIAGNOSIS — I10 Essential (primary) hypertension: Secondary | ICD-10-CM | POA: Diagnosis not present

## 2014-11-08 DIAGNOSIS — I252 Old myocardial infarction: Secondary | ICD-10-CM

## 2014-11-08 DIAGNOSIS — I2510 Atherosclerotic heart disease of native coronary artery without angina pectoris: Secondary | ICD-10-CM | POA: Diagnosis present

## 2014-11-08 DIAGNOSIS — Z720 Tobacco use: Secondary | ICD-10-CM | POA: Diagnosis not present

## 2014-11-08 DIAGNOSIS — M5136 Other intervertebral disc degeneration, lumbar region: Secondary | ICD-10-CM | POA: Diagnosis not present

## 2014-11-08 DIAGNOSIS — Z23 Encounter for immunization: Secondary | ICD-10-CM | POA: Diagnosis not present

## 2014-11-08 DIAGNOSIS — L03211 Cellulitis of face: Secondary | ICD-10-CM | POA: Diagnosis not present

## 2014-11-08 DIAGNOSIS — Z809 Family history of malignant neoplasm, unspecified: Secondary | ICD-10-CM | POA: Diagnosis not present

## 2014-11-08 DIAGNOSIS — Z7982 Long term (current) use of aspirin: Secondary | ICD-10-CM

## 2014-11-08 DIAGNOSIS — F17200 Nicotine dependence, unspecified, uncomplicated: Secondary | ICD-10-CM | POA: Diagnosis present

## 2014-11-08 DIAGNOSIS — R609 Edema, unspecified: Secondary | ICD-10-CM | POA: Diagnosis not present

## 2014-11-08 LAB — COMPREHENSIVE METABOLIC PANEL
ALT: 12 U/L (ref 0–53)
ANION GAP: 10 (ref 5–15)
AST: 19 U/L (ref 0–37)
Albumin: 3.1 g/dL — ABNORMAL LOW (ref 3.5–5.2)
Alkaline Phosphatase: 71 U/L (ref 39–117)
BUN: 10 mg/dL (ref 6–23)
CO2: 22 mmol/L (ref 19–32)
Calcium: 8.4 mg/dL (ref 8.4–10.5)
Chloride: 103 mmol/L (ref 96–112)
Creatinine, Ser: 1.23 mg/dL (ref 0.50–1.35)
GFR calc Af Amer: 69 mL/min — ABNORMAL LOW (ref >90)
GFR calc non Af Amer: 60 mL/min — ABNORMAL LOW (ref >90)
Glucose, Bld: 172 mg/dL — ABNORMAL HIGH (ref 70–99)
Potassium: 3.1 mmol/L — ABNORMAL LOW (ref 3.5–5.1)
Sodium: 135 mmol/L (ref 135–145)
TOTAL PROTEIN: 7.6 g/dL (ref 6.0–8.3)
Total Bilirubin: 0.5 mg/dL (ref 0.3–1.2)

## 2014-11-08 LAB — CBC WITH DIFFERENTIAL/PLATELET
BASOS ABS: 0 10*3/uL (ref 0.0–0.1)
Basophils Relative: 0 % (ref 0–1)
EOS ABS: 0.1 10*3/uL (ref 0.0–0.7)
Eosinophils Relative: 0 % (ref 0–5)
HCT: 40.2 % (ref 39.0–52.0)
HEMOGLOBIN: 13.8 g/dL (ref 13.0–17.0)
Lymphocytes Relative: 18 % (ref 12–46)
Lymphs Abs: 2.6 10*3/uL (ref 0.7–4.0)
MCH: 29.2 pg (ref 26.0–34.0)
MCHC: 34.3 g/dL (ref 30.0–36.0)
MCV: 85.2 fL (ref 78.0–100.0)
MONO ABS: 1.6 10*3/uL — AB (ref 0.1–1.0)
MONOS PCT: 11 % (ref 3–12)
NEUTROS ABS: 10.4 10*3/uL — AB (ref 1.7–7.7)
Neutrophils Relative %: 71 % (ref 43–77)
PLATELETS: 243 10*3/uL (ref 150–400)
RBC: 4.72 MIL/uL (ref 4.22–5.81)
RDW: 14.5 % (ref 11.5–15.5)
WBC: 14.7 10*3/uL — ABNORMAL HIGH (ref 4.0–10.5)

## 2014-11-08 MED ORDER — CLINDAMYCIN PHOSPHATE 600 MG/50ML IV SOLN
600.0000 mg | Freq: Three times a day (TID) | INTRAVENOUS | Status: DC
  Administered 2014-11-08 – 2014-11-09 (×3): 600 mg via INTRAVENOUS
  Filled 2014-11-08 (×7): qty 50

## 2014-11-08 MED ORDER — NITROGLYCERIN 0.4 MG SL SUBL: 0.4000 mg | SUBLINGUAL_TABLET | SUBLINGUAL | Status: DC | PRN

## 2014-11-08 MED ORDER — SODIUM CHLORIDE 0.9 % IV SOLN: INTRAVENOUS | Status: DC

## 2014-11-08 MED ORDER — POTASSIUM CHLORIDE CRYS ER 20 MEQ PO TBCR
40.0000 meq | EXTENDED_RELEASE_TABLET | Freq: Once | ORAL | Status: AC
  Administered 2014-11-08: 40 meq via ORAL
  Filled 2014-11-08: qty 2

## 2014-11-08 MED ORDER — ATORVASTATIN CALCIUM 40 MG PO TABS
40.0000 mg | ORAL_TABLET | Freq: Every day | ORAL | Status: DC
  Administered 2014-11-08: 40 mg via ORAL
  Filled 2014-11-08: qty 1

## 2014-11-08 MED ORDER — AMLODIPINE BESYLATE 5 MG PO TABS
10.0000 mg | ORAL_TABLET | Freq: Every day | ORAL | Status: DC
  Administered 2014-11-08 – 2014-11-09 (×2): 10 mg via ORAL
  Filled 2014-11-08 (×2): qty 2

## 2014-11-08 MED ORDER — OXYCODONE HCL 5 MG PO TABS: 5.0000 mg | ORAL_TABLET | ORAL | Status: DC | PRN

## 2014-11-08 MED ORDER — IOHEXOL 300 MG/ML  SOLN
80.0000 mL | Freq: Once | INTRAMUSCULAR | Status: AC | PRN
  Administered 2014-11-08: 80 mL via INTRAVENOUS

## 2014-11-08 MED ORDER — ACETAMINOPHEN 325 MG PO TABS
650.0000 mg | ORAL_TABLET | Freq: Four times a day (QID) | ORAL | Status: DC | PRN
  Administered 2014-11-08: 650 mg via ORAL
  Filled 2014-11-08: qty 2

## 2014-11-08 MED ORDER — HEPARIN SODIUM (PORCINE) 5000 UNIT/ML IJ SOLN
5000.0000 [IU] | Freq: Three times a day (TID) | INTRAMUSCULAR | Status: DC
  Administered 2014-11-08 – 2014-11-09 (×3): 5000 [IU] via SUBCUTANEOUS
  Filled 2014-11-08 (×3): qty 1

## 2014-11-08 MED ORDER — SODIUM CHLORIDE 0.9 % IV SOLN
INTRAVENOUS | Status: DC
  Administered 2014-11-08: 10:00:00 via INTRAVENOUS

## 2014-11-08 MED ORDER — ALUM & MAG HYDROXIDE-SIMETH 200-200-20 MG/5ML PO SUSP
30.0000 mL | Freq: Once | ORAL | Status: DC
Start: 2014-11-08 — End: 2014-11-09
  Filled 2014-11-08 (×2): qty 30

## 2014-11-08 MED ORDER — ONDANSETRON HCL 4 MG PO TABS: 4.0000 mg | ORAL_TABLET | Freq: Four times a day (QID) | ORAL | Status: DC | PRN

## 2014-11-08 MED ORDER — VANCOMYCIN HCL IN DEXTROSE 1-5 GM/200ML-% IV SOLN
1000.0000 mg | Freq: Once | INTRAVENOUS | Status: AC
  Administered 2014-11-08: 1000 mg via INTRAVENOUS
  Filled 2014-11-08: qty 200

## 2014-11-08 MED ORDER — ACETAMINOPHEN 650 MG RE SUPP: 650.0000 mg | Freq: Four times a day (QID) | RECTAL | Status: DC | PRN

## 2014-11-08 MED ORDER — ASPIRIN EC 325 MG PO TBEC
325.0000 mg | DELAYED_RELEASE_TABLET | Freq: Every day | ORAL | Status: DC
  Administered 2014-11-08 – 2014-11-09 (×2): 325 mg via ORAL
  Filled 2014-11-08 (×2): qty 1

## 2014-11-08 MED ORDER — PNEUMOCOCCAL VAC POLYVALENT 25 MCG/0.5ML IJ INJ
0.5000 mL | INJECTION | INTRAMUSCULAR | Status: DC
  Filled 2014-11-08: qty 0.5

## 2014-11-08 MED ORDER — ONDANSETRON HCL 4 MG/2ML IJ SOLN: 4.0000 mg | Freq: Four times a day (QID) | INTRAMUSCULAR | Status: DC | PRN

## 2014-11-08 MED ORDER — SENNOSIDES-DOCUSATE SODIUM 8.6-50 MG PO TABS: 1.0000 | ORAL_TABLET | Freq: Every evening | ORAL | Status: DC | PRN

## 2014-11-08 MED ORDER — CARVEDILOL 3.125 MG PO TABS
6.2500 mg | ORAL_TABLET | Freq: Two times a day (BID) | ORAL | Status: DC
  Administered 2014-11-08 – 2014-11-09 (×3): 6.25 mg via ORAL
  Filled 2014-11-08 (×3): qty 2

## 2014-11-08 NOTE — ED Notes (Signed)
Reports hx of boils and cellulitis of face.

## 2014-11-08 NOTE — Progress Notes (Signed)
Utilization review Completed Carnel Stegman RN BSN   

## 2014-11-08 NOTE — ED Notes (Signed)
Patient c/o facial swelling that starts on right check and extends to forehead and top of head. Patient reports pain with the swelling. Per patient glands in neck were swollen but that has improved. Patient states "I had something like this a year ago and was diagnosed with cellulitis." Swelling and redness noted.

## 2014-11-08 NOTE — ED Provider Notes (Signed)
CSN: [id number]     Arrival date & time 11/08/14  4888 History   This chart was scribed for Milton Ferguson, MD by Chester Holstein, ED Scribe. This patient was seen in room APA11/APA11 and the patient's care was started at 9:22 AM.    Chief Complaint  Patient presents with  . Facial Swelling    Patient is a 64 y.o. male presenting with rash. The history is provided by the patient. No language interpreter was used.  Rash Location:  Head/neck and face Head/neck rash location:  Scalp Facial rash location:  R cheek and forehead Quality: painful, redness and swelling   Quality: not itchy   Pain details:    Severity:  Moderate   Onset quality:  Sudden   Duration:  4 days   Timing:  Constant   Progression:  Unchanged Severity:  Moderate Onset quality:  Sudden Duration:  4 days Timing:  Constant Progression:  Unchanged Chronicity:  New Relieved by:  None tried Ineffective treatments:  None tried Associated symptoms: induration   Associated symptoms: no abdominal pain, no diarrhea, no fatigue, no fever and no headaches    HPI Comments: Ruben Young is a 64 y.o. male with PMHx of MI, DDD, HTN, and HLD, who presents to the Emergency Department complaining of facial swelling and rash to right cheek, forehead, and top of scalp with onset 4 days ago. Pt notes associated pain, redness, and chills. Pt denies itching and fever.  Past Medical History  Diagnosis Date  . MI (myocardial infarction)   . DDD (degenerative disc disease), lumbar   . Hypertension   . Hyperlipidemia   . Old myocardial infarction   . Tobacco use disorder    Past Surgical History  Procedure Laterality Date  . Tonsillectomy    . Coronary angioplasty with stent placement    . Left heart catheterization with coronary angiogram N/A 09/24/2012    Procedure: LEFT HEART CATHETERIZATION WITH CORONARY ANGIOGRAM;  Surgeon: Jettie Booze, MD;  Location: [facility] CATH LAB;  Service: Cardiovascular;  Laterality: N/A;    Family History  Problem Relation Age of Onset  . Cancer Mother   . Heart attack Mother    History  Substance Use Topics  . Smoking status: Former Smoker -- 0.10 packs/day for 44 years    Types: Cigarettes    Quit date: 10/25/2014  . Smokeless tobacco: Never Used  . Alcohol Use: No    Review of Systems  Constitutional: Positive for chills. Negative for fever, appetite change and fatigue.  HENT: Positive for facial swelling. Negative for congestion, ear discharge and sinus pressure.   Eyes: Negative for discharge.  Respiratory: Negative for cough.   Cardiovascular: Negative for chest pain.  Gastrointestinal: Negative for abdominal pain and diarrhea.  Genitourinary: Negative for frequency and hematuria.  Musculoskeletal: Negative for back pain.  Skin: Positive for color change and rash.  Neurological: Negative for seizures and headaches.  Psychiatric/Behavioral: Negative for hallucinations.      Allergies  Review of patient's allergies indicates no known allergies.  Home Medications   Prior to Admission medications   Medication Sig Start Date End Date Taking? Authorizing Provider  amLODipine (NORVASC) 10 MG tablet Take 1 tablet (10 mg total) by mouth daily. 02/10/14  Yes Liliane Shi, PA-C  aspirin 325 MG EC tablet Take 325 mg by mouth daily.   Yes Historical Provider, MD  atorvastatin (LIPITOR) 40 MG tablet Take 1 tablet (40 mg total) by mouth daily at 6 PM. 02/10/14  Yes Liliane Shi, PA-C  carvedilol (COREG) 6.25 MG tablet Take 1 tablet (6.25 mg total) by mouth 2 (two) times daily. 05/26/14  Yes Scott T Kathlen Mody, PA-C  nitroGLYCERIN (NITROSTAT) 0.4 MG SL tablet Place 0.4 mg under the tongue every 5 (five) minutes as needed. For chest pains   Yes Historical Provider, MD   BP 134/80 mmHg  Pulse 90  Temp(Src) 98.7 F (37.1 C) (Oral)  Resp 20  Ht 5\' 8"  (1.727 m)  Wt 159 lb (72.122 kg)  BMI 24.18 kg/m2  SpO2 97% Physical Exam  Constitutional: He is oriented to  person, place, and time. He appears well-developed.  HENT:  Head: Normocephalic.  Eyes: Conjunctivae and EOM are normal. No scleral icterus.  Neck: Neck supple. No thyromegaly present.  Cardiovascular: Normal rate and regular rhythm.  Exam reveals no gallop and no friction rub.   No murmur heard. Pulmonary/Chest: No stridor. He has no wheezes. He has no rales. He exhibits no tenderness.  Abdominal: He exhibits no distension. There is no tenderness. There is no rebound.  Musculoskeletal: Normal range of motion. He exhibits no edema.  Lymphadenopathy:    He has no cervical adenopathy.  Neurological: He is alert and oriented to person, place, and time. He exhibits normal muscle tone. Coordination normal.  Skin: Rash noted. No erythema.  Rash to right cheek and forehead; indurated and tender  Psychiatric: He has a normal mood and affect. His behavior is normal.  Nursing note and vitals reviewed.   ED Course  Procedures (including critical care time) DIAGNOSTIC STUDIES: Oxygen Saturation is 97% on room air, normal by my interpretation.    COORDINATION OF CARE: 9:30 AM Discussed treatment plan with patient at beside, the patient agrees with the plan and has no further questions at this time.   Labs Review Labs Reviewed  CBC WITH DIFFERENTIAL/PLATELET  COMPREHENSIVE METABOLIC PANEL    Imaging Review No results found.    EKG Interpretation None     Meds ordered this encounter  Medications  . 0.9 %  sodium chloride infusion    Sig:      MDM   Final diagnoses:  None   Admit,  Cellulitis  The chart was scribed for me under my direct supervision.  I personally performed the history, physical, and medical decision making and all procedures in the evaluation of this patient.Milton Ferguson, MD 11/08/14 [phone]

## 2014-11-08 NOTE — Progress Notes (Signed)
Patient c/o heart burn and indigestion, paged on-call MD. Will follow orders given and continue to monitor the patient.

## 2014-11-08 NOTE — ED Notes (Signed)
Pt. To have CT before going to floor.

## 2014-11-08 NOTE — H&P (Signed)
[facility]          History and Physical    PCP:   Maggie Font, MD   Chief Complaint:  Swelling of the face  HPI: Patient is a 64 year old man with past medical history significant for hypertension, hyperlipidemia, tobacco use, coronary artery disease with prior MI who presents to the hospital today with the above-mentioned complaint. He states about 3 days ago he started noticing some swelling of his submandibular glands and it quickly progressed to edema and thickening of the skin of his right cheek and now up to his forehead and scalp and down into his upper neck. He denies any trauma to the side or insect bites, he does note a loose molar on the bottom right that has been bothering him for some time now. He has felt feverish and has had chills but has not taken his actual temperature. He is thought to have a facial cellulitis and we have been consulted for admission for further evaluation and management.  Allergies:  No Known Allergies    Past Medical History  Diagnosis Date  . MI (myocardial infarction)   . DDD (degenerative disc disease), lumbar   . Hypertension   . Hyperlipidemia   . Old myocardial infarction   . Tobacco use disorder     Past Surgical History  Procedure Laterality Date  . Tonsillectomy    . Coronary angioplasty with stent placement    . Left heart catheterization with coronary angiogram N/A 09/24/2012    Procedure: LEFT HEART CATHETERIZATION WITH CORONARY ANGIOGRAM;  Surgeon: Jettie Booze, MD;  Location: [facility] CATH LAB;  Service: Cardiovascular;  Laterality: N/A;    Prior to Admission medications   Medication Sig Start Date End Date Taking? Authorizing Provider  amLODipine (NORVASC) 10 MG tablet Take 1 tablet (10 mg total) by mouth daily. 02/10/14  Yes Liliane Shi, PA-C  aspirin 325 MG EC tablet Take 325 mg by mouth daily.   Yes Historical Provider, MD  atorvastatin (LIPITOR) 40 MG tablet Take 1 tablet (40 mg total) by  mouth daily at 6 PM. 02/10/14  Yes Scott T Kathlen Mody, PA-C  carvedilol (COREG) 6.25 MG tablet Take 1 tablet (6.25 mg total) by mouth 2 (two) times daily. 05/26/14  Yes Scott T Kathlen Mody, PA-C  nitroGLYCERIN (NITROSTAT) 0.4 MG SL tablet Place 0.4 mg under the tongue every 5 (five) minutes as needed. For chest pains   Yes Historical Provider, MD    Social History:  reports that he quit smoking about 2 weeks ago. His smoking use included Cigarettes. He has a 4.4 pack-year smoking history. He has never used smokeless tobacco. He reports that he does not drink alcohol or use illicit drugs.  Family History  Problem Relation Age of Onset  . Cancer Mother   . Heart attack Mother     Review of Systems:  Constitutional: Denies  diaphoresis, appetite change and fatigue.  HEENT: Denies photophobia, eye pain, redness, hearing loss, ear pain, congestion, sore throat, rhinorrhea, sneezing, mouth sores, trouble swallowing, neck pain, neck stiffness and tinnitus.   Respiratory: Denies SOB, DOE, cough, chest tightness,  and wheezing.   Cardiovascular: Denies chest pain, palpitations and leg swelling.  Gastrointestinal: Denies nausea, vomiting, abdominal pain, diarrhea, constipation, blood in stool and abdominal distention.  Genitourinary: Denies dysuria, urgency, frequency, hematuria, flank pain and difficulty urinating.  Endocrine: Denies: hot or cold intolerance, sweats, changes in hair or  nails, polyuria, polydipsia. Musculoskeletal: Denies myalgias, back pain, joint swelling, arthralgias and gait problem.  Skin: Denies pallor, rash and wound.  Neurological: Denies dizziness, seizures, syncope, weakness, light-headedness, numbness and headaches.  Hematological: Denies adenopathy. Easy bruising, personal or family bleeding history  Psychiatric/Behavioral: Denies suicidal ideation, mood changes, confusion, nervousness, sleep disturbance and agitation   Physical Exam: Blood pressure 140/74, pulse 83,  temperature 101.2 F (38.4 C), temperature source Oral, resp. rate 20, height 5' 8"  (1.727 m), weight 72.122 kg (159 lb), SpO2 99 %. General: Alert, awake, oriented 3, no distress, currently eating lunch. HEENT: Normocephalic, atraumatic, pupils equal round and reactive to light, extraocular movements intact, edema of his right cheek, right side of his forehead extending up into the scalp with thickened skin and some mild redness, moist mucous membranes, poor dentition. Neck: Supple, no JVD, has enlarged submandibular cervical and retroauricular nodes, no bruits, no goiter. Cardiovascular: Regular rate and rhythm, no murmurs, rubs or gallops. Lungs: Clear to auscultation bilaterally. Abdomen: Soft, nontender, nondistended, positive bowel sounds, no masses or organomegaly noted. Extremities: No clubbing, cyanosis or edema, positive pulses. Neurologic: Grossly intact and nonfocal.    Labs on Admission:  Results for orders placed or performed during the hospital encounter of 11/08/14 (from the past 48 hour(s))  CBC with Differential/Platelet     Status: Abnormal   Collection Time: 11/08/14  9:35 AM  Result Value Ref Range   WBC 14.7 (H) 4.0 - 10.5 K/uL   RBC 4.72 4.22 - 5.81 MIL/uL   Hemoglobin 13.8 13.0 - 17.0 g/dL   HCT 40.2 39.0 - 52.0 %   MCV 85.2 78.0 - 100.0 fL   MCH 29.2 26.0 - 34.0 pg   MCHC 34.3 30.0 - 36.0 g/dL   RDW 14.5 11.5 - 15.5 %   Platelets 243 150 - 400 K/uL   Neutrophils Relative % 71 43 - 77 %   Neutro Abs 10.4 (H) 1.7 - 7.7 K/uL   Lymphocytes Relative 18 12 - 46 %   Lymphs Abs 2.6 0.7 - 4.0 K/uL   Monocytes Relative 11 3 - 12 %   Monocytes Absolute 1.6 (H) 0.1 - 1.0 K/uL   Eosinophils Relative 0 0 - 5 %   Eosinophils Absolute 0.1 0.0 - 0.7 K/uL   Basophils Relative 0 0 - 1 %   Basophils Absolute 0.0 0.0 - 0.1 K/uL  Comprehensive metabolic panel     Status: Abnormal   Collection Time: 11/08/14  9:35 AM  Result Value Ref Range   Sodium 135 135 - 145 mmol/L    Potassium 3.1 (L) 3.5 - 5.1 mmol/L   Chloride 103 96 - 112 mmol/L   CO2 22 19 - 32 mmol/L   Glucose, Bld 172 (H) 70 - 99 mg/dL   BUN 10 6 - 23 mg/dL   Creatinine, Ser 1.23 0.50 - 1.35 mg/dL   Calcium 8.4 8.4 - 10.5 mg/dL   Total Protein 7.6 6.0 - 8.3 g/dL   Albumin 3.1 (L) 3.5 - 5.2 g/dL   AST 19 0 - 37 U/L   ALT 12 0 - 53 U/L   Alkaline Phosphatase 71 39 - 117 U/L   Total Bilirubin 0.5 0.3 - 1.2 mg/dL   GFR calc non Af Amer 60 (L) >90 mL/min   GFR calc Af Amer 69 (L) >90 mL/min    Comment: (NOTE) The eGFR has been calculated using the CKD EPI equation. This calculation has not been validated in all clinical situations. eGFR's persistently <  90 mL/min signify possible Chronic Kidney Disease.    Anion gap 10 5 - 15    Radiological Exams on Admission: Ct Maxillofacial W/cm  11/08/2014   CLINICAL DATA:  Right-sided facial swelling 5 days.  Fever.  EXAM: CT MAXILLOFACIAL WITH CONTRAST  TECHNIQUE: Multidetector CT imaging of the maxillofacial structures was performed with intravenous contrast. Multiplanar CT image reconstructions were also generated. A small metallic BB was placed on the right temple in order to reliably differentiate right from left.  CONTRAST:  32m OMNIPAQUE IOHEXOL 300 MG/ML  SOLN  COMPARISON:  Report of previous maxillofacial CT 10/12/2013  FINDINGS: Visualized intracranial structures are within normal. The orbits are normal and symmetric. Paranasal sinuses and mastoid air cells are clear.  There is mild subcutaneous edema over the anterior lateral right mid to lower face from the infraorbital region to the mandible. No focal abscess identified. No significant dental caries. There is subtle lucency adjacent the root of a right lower molar tooth which may be the origin of this infectious soft tissue process.  Pharynx/hypopharynx and larynx are within normal. Epiglottis and subglottic airway are normal. Salivary glands are normal and symmetric. No significant adenopathy. Mild  calcified plaque at the carotid artery bifurcations. Moderate spondylosis of the cervical spine with multilevel disc disease.  IMPRESSION: Mild subcutaneous edema over the anterior lateral right mid to lower face likely mild cellulitis which may be dental in origin originating from subtle periodontal disease of a right lower molar tooth. No soft tissue abscess.  Mild atherosclerotic plaque at the carotid bulbs bilaterally.  Moderate spondylosis of the cervical spinal multilevel degenerative disc disease.   Electronically Signed   By: DMarin OlpM.D.   On: 11/08/2014 12:29    Assessment/Plan Principal Problem:   Facial cellulitis Active Problems:   Tobacco use disorder   Coronary atherosclerosis of native coronary artery   Essential hypertension, benign   HLD (hyperlipidemia)   Facial cellulitis -I have reviewed results of CT scan that shows subcutaneous edema over the anterior lateral right mid to lower face likely representing cellulitis which may be dental in origin originating from periodontal disease of the right lower molar tooth with no soft tissue abscess. -We'll elect to start IV clindamycin as per cellulitis order set. -Blood cultures and HIV antibody have been ordered. -If afebrile and improved overnight, will consider discharging home in the morning on a course of oral antibiotics. -Will require dental follow-up.  Tobacco abuse -Counseling provided. -Patient states that as of last week he has quit smoking, will continue to encourage cessation efforts.  Coronary artery disease with prior MI -Currently stable, no chest pain. -Continue aspirin, statin, Coreg.  Hyperlipidemia -Continue Lipitor.   DVT prophylaxis -Subcutaneous heparin.  CODE STATUS -Full code   Time Spent on Admission: 85 minutes  HERNANDEZ ACOSTA,ESTELA [facility] Pager: [phone] 06444/05/2015, 1:49 PM

## 2014-11-09 LAB — CBC
HCT: 36.1 % — ABNORMAL LOW (ref 39.0–52.0)
Hemoglobin: 11.9 g/dL — ABNORMAL LOW (ref 13.0–17.0)
MCH: 28.5 pg (ref 26.0–34.0)
MCHC: 33 g/dL (ref 30.0–36.0)
MCV: 86.6 fL (ref 78.0–100.0)
PLATELETS: 265 10*3/uL (ref 150–400)
RBC: 4.17 MIL/uL — ABNORMAL LOW (ref 4.22–5.81)
RDW: 15 % (ref 11.5–15.5)
WBC: 11.7 10*3/uL — ABNORMAL HIGH (ref 4.0–10.5)

## 2014-11-09 LAB — BASIC METABOLIC PANEL
Anion gap: 7 (ref 5–15)
BUN: 13 mg/dL (ref 6–23)
CALCIUM: 7.9 mg/dL — AB (ref 8.4–10.5)
CO2: 24 mmol/L (ref 19–32)
Chloride: 107 mmol/L (ref 96–112)
Creatinine, Ser: 1.26 mg/dL (ref 0.50–1.35)
GFR calc Af Amer: 67 mL/min — ABNORMAL LOW (ref >90)
GFR calc non Af Amer: 58 mL/min — ABNORMAL LOW (ref >90)
GLUCOSE: 101 mg/dL — AB (ref 70–99)
Potassium: 3.4 mmol/L — ABNORMAL LOW (ref 3.5–5.1)
Sodium: 138 mmol/L (ref 135–145)

## 2014-11-09 MED ORDER — AMOXICILLIN-POT CLAVULANATE 875-125 MG PO TABS: 1.0000 | ORAL_TABLET | Freq: Two times a day (BID) | ORAL | Status: DC

## 2014-11-09 NOTE — Care Management Note (Signed)
    Page 1 of 1   11/09/2014     11:09:02 AM CARE MANAGEMENT NOTE 11/09/2014  Patient:  Ruben Young, Ruben Young   Account Number:  [id number]  Date Initiated:  11/09/2014  Documentation initiated by:  Theophilus Kinds  Subjective/Objective Assessment:   Pt admitted from home with cellulitis. Pt lives alone and will return home at discharge. Pt is independent with ADL's.     Action/Plan:   Pt for discharge home today. No CM needs noted.   Anticipated DC Date:  11/09/2014   Anticipated DC Plan:  [place]  CM consult      Choice offered to / List presented to:             Status of service:  Completed, signed off Medicare Important Message given?   (If response is "NO", the following Medicare IM given date fields will be blank) Date Medicare IM given:   Medicare IM given by:   Date Additional Medicare IM given:   Additional Medicare IM given by:    Discharge Disposition:  HOME/SELF CARE  Per UR Regulation:    If discussed at Long Length of Stay Meetings, dates discussed:    Comments:  11/09/14 [place], RN BSN CM

## 2014-11-09 NOTE — Discharge Summary (Signed)
Physician Discharge Summary  Ruben Young VZC:[id number] DOB: 1950/08/05 DOA: 11/08/2014  PCP: Maggie Font, MD  Admit date: 11/08/2014 Discharge date: 11/09/2014  Time spent: 45 minutes  Recommendations for Outpatient Follow-up:  -Will be discharged home today. -We'll ask case manager to advise patient on any dental opportunities that exist in the community.   Discharge Diagnoses:  Principal Problem:   Facial cellulitis Active Problems:   Tobacco use disorder   Coronary atherosclerosis of native coronary artery   Essential hypertension, benign   HLD (hyperlipidemia)   Discharge Condition: Stable and improved  Filed Weights   11/08/14 0923 11/08/14 1211  Weight: 72.122 kg (159 lb) 72.122 kg (159 lb)    History of present illness:  Patient is a 64 year old man with past medical history significant for hypertension, hyperlipidemia, tobacco use, coronary artery disease with prior MI who presents to the hospital today with the above-mentioned complaint. He states about 3 days ago he started noticing some swelling of his submandibular glands and it quickly progressed to edema and thickening of the skin of his right cheek and now up to his forehead and scalp and down into his upper neck. He denies any trauma to the side or insect bites, he does note a loose molar on the bottom right that has been bothering him for some time now. He has felt feverish and has had chills but has not taken his actual temperature. He is thought to have a facial cellulitis and we were consulted for admission for further evaluation and management.   Hospital Course:   Facial cellulitis -Much improved today. -Suspect source is probably periodontal disease, will ask case management to advise patient on any dental opportunities in the community. -We'll discharge on Augmentin for 8 days.  Tobacco abuse -Quit as of 2 weeks ago. Have encouraged cessation efforts.  Coronary artery disease with prior  history of MI -Stable, no chest pain. -Continue aspirin, statin, Coreg  Hyperlipidemia -Continue Lipitor.  Procedures:  None   Consultations:  None  Discharge Instructions  Discharge Instructions    Diet - low sodium heart healthy    Complete by:  As directed      Increase activity slowly    Complete by:  As directed             Medication List    TAKE these medications        amLODipine 10 MG tablet  Commonly known as:  NORVASC  Take 1 tablet (10 mg total) by mouth daily.     amoxicillin-clavulanate 875-125 MG per tablet  Commonly known as:  AUGMENTIN  Take 1 tablet by mouth 2 (two) times daily.     aspirin 325 MG EC tablet  Take 325 mg by mouth daily.     atorvastatin 40 MG tablet  Commonly known as:  LIPITOR  Take 1 tablet (40 mg total) by mouth daily at 6 PM.     carvedilol 6.25 MG tablet  Commonly known as:  COREG  Take 1 tablet (6.25 mg total) by mouth 2 (two) times daily.     nitroGLYCERIN 0.4 MG SL tablet  Commonly known as:  NITROSTAT  Place 0.4 mg under the tongue every 5 (five) minutes as needed. For chest pains       No Known Allergies     Follow-up Information    Follow up with HILL,GERALD K, MD. Schedule an appointment as soon as possible for a visit in 2 weeks.   Specialty:  Family Medicine   Contact information:   [address]  [phone]        The results of significant diagnostics from this hospitalization (including imaging, microbiology, ancillary and laboratory) are listed below for reference.    Significant Diagnostic Studies: Ct Maxillofacial W/cm  11/08/2014   CLINICAL DATA:  Right-sided facial swelling 5 days.  Fever.  EXAM: CT MAXILLOFACIAL WITH CONTRAST  TECHNIQUE: Multidetector CT imaging of the maxillofacial structures was performed with intravenous contrast. Multiplanar CT image reconstructions were also generated. A small metallic BB was placed on the right temple in order to  reliably differentiate right from left.  CONTRAST:  91mL OMNIPAQUE IOHEXOL 300 MG/ML  SOLN  COMPARISON:  Report of previous maxillofacial CT 10/12/2013  FINDINGS: Visualized intracranial structures are within normal. The orbits are normal and symmetric. Paranasal sinuses and mastoid air cells are clear.  There is mild subcutaneous edema over the anterior lateral right mid to lower face from the infraorbital region to the mandible. No focal abscess identified. No significant dental caries. There is subtle lucency adjacent the root of a right lower molar tooth which may be the origin of this infectious soft tissue process.  Pharynx/hypopharynx and larynx are within normal. Epiglottis and subglottic airway are normal. Salivary glands are normal and symmetric. No significant adenopathy. Mild calcified plaque at the carotid artery bifurcations. Moderate spondylosis of the cervical spine with multilevel disc disease.  IMPRESSION: Mild subcutaneous edema over the anterior lateral right mid to lower face likely mild cellulitis which may be dental in origin originating from subtle periodontal disease of a right lower molar tooth. No soft tissue abscess.  Mild atherosclerotic plaque at the carotid bulbs bilaterally.  Moderate spondylosis of the cervical spinal multilevel degenerative disc disease.   Electronically Signed   By: Marin Olp M.D.   On: 11/08/2014 12:29    Microbiology: No results found for this or any previous visit (from the past 240 hour(s)).   Labs: Basic Metabolic Panel:  Recent Labs Lab 11/08/14 0935 11/09/14 0604  NA 135 138  K 3.1* 3.4*  CL 103 107  CO2 22 24  GLUCOSE 172* 101*  BUN 10 13  CREATININE 1.23 1.26  CALCIUM 8.4 7.9*   Liver Function Tests:  Recent Labs Lab 11/08/14 0935  AST 19  ALT 12  ALKPHOS 71  BILITOT 0.5  PROT 7.6  ALBUMIN 3.1*   No results for input(s): LIPASE, AMYLASE in the last 168 hours. No results for input(s): AMMONIA in the last 168  hours. CBC:  Recent Labs Lab 11/08/14 0935 11/09/14 0604  WBC 14.7* 11.7*  NEUTROABS 10.4*  --   HGB 13.8 11.9*  HCT 40.2 36.1*  MCV 85.2 86.6  PLT 243 265   Cardiac Enzymes: No results for input(s): CKTOTAL, CKMB, CKMBINDEX, TROPONINI in the last 168 hours. BNP: BNP (last 3 results) No results for input(s): BNP in the last 8760 hours.  ProBNP (last 3 results) No results for input(s): PROBNP in the last 8760 hours.  CBG: No results for input(s): GLUCAP in the last 168 hours.     SignedLelon Frohlich  [facility] Pager: [phone] 11/09/2014, 10:48 AM

## 2014-11-09 NOTE — Progress Notes (Signed)
Patient with orders to be discharged home. Discharge instructions given, patient verbalized understanding. Prescriptions given. Patient stable. Patient left in private vehicle with family.

## 2014-11-13 LAB — RNA QUALITATIVE: HIV 1 RNA QUALITATIVE: NEGATIVE

## 2014-11-13 LAB — CULTURE, BLOOD (ROUTINE X 2)
CULTURE: NO GROWTH
Culture: NO GROWTH

## 2014-11-13 LAB — HIV 1/2 AB DIFFERENTIATION
HIV 1 Ab: NONREACTIVE
HIV 2 Ab: NONREACTIVE

## 2014-11-13 LAB — HIV ANTIBODY (ROUTINE TESTING W REFLEX): HIV SCREEN 4TH GENERATION: REACTIVE — AB

## 2014-11-16 DIAGNOSIS — R8290 Unspecified abnormal findings in urine: Secondary | ICD-10-CM | POA: Diagnosis not present

## 2014-11-16 DIAGNOSIS — R8299 Other abnormal findings in urine: Secondary | ICD-10-CM | POA: Diagnosis not present

## 2014-12-02 DIAGNOSIS — R8290 Unspecified abnormal findings in urine: Secondary | ICD-10-CM | POA: Diagnosis not present

## 2015-03-01 ENCOUNTER — Encounter: Payer: Self-pay | Admitting: Family

## 2015-03-01 ENCOUNTER — Encounter: Payer: Self-pay | Admitting: Vascular Surgery

## 2015-03-02 DIAGNOSIS — E785 Hyperlipidemia, unspecified: Secondary | ICD-10-CM | POA: Diagnosis not present

## 2015-03-02 DIAGNOSIS — I10 Essential (primary) hypertension: Secondary | ICD-10-CM | POA: Diagnosis not present

## 2015-03-03 ENCOUNTER — Ambulatory Visit (INDEPENDENT_AMBULATORY_CARE_PROVIDER_SITE_OTHER): Payer: Commercial Managed Care - HMO | Admitting: Family

## 2015-03-03 ENCOUNTER — Other Ambulatory Visit: Payer: Self-pay | Admitting: Family

## 2015-03-03 ENCOUNTER — Ambulatory Visit (HOSPITAL_COMMUNITY)
Admission: RE | Admit: 2015-03-03 | Discharge: 2015-03-03 | Disposition: A | Discharge status: Home / Self Care | Payer: Commercial Managed Care - HMO | Source: Ambulatory Visit | Attending: Family | Admitting: Family

## 2015-03-03 ENCOUNTER — Encounter: Payer: Self-pay | Admitting: Family

## 2015-03-03 VITALS — BP 136/85 | HR 62 | Temp 97.3°F | Resp 14 | Ht 68.0 in | Wt 155.0 lb

## 2015-03-03 DIAGNOSIS — I723 Aneurysm of iliac artery: Secondary | ICD-10-CM

## 2015-03-03 DIAGNOSIS — Z48812 Encounter for surgical aftercare following surgery on the circulatory system: Secondary | ICD-10-CM
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
## 2015-03-03 NOTE — Progress Notes (Signed)
Filed Vitals:   03/03/15 0958 03/03/15 1001  BP: 160/88 136/85  Pulse: 60 62  Temp: 97.3 F (36.3 C)   Resp: 14   Height: 5\' 8"  (1.727 m)   Weight: 155 lb (70.308 kg)   SpO2: 100%

## 2015-03-03 NOTE — Patient Instructions (Signed)
Smoking Cessation  Quitting smoking is important to your health and has many advantages. However, it is not always easy to quit since nicotine is a very addictive drug. Oftentimes, people try 3 times or more before being able to quit. This document explains the best ways for you to prepare to quit smoking. Quitting takes hard work and a lot of effort, but you can do it.  ADVANTAGES OF QUITTING SMOKING  · You will live longer, feel better, and live better.  · Your body will feel the impact of quitting smoking almost immediately.  · Within 20 minutes, blood pressure decreases. Your pulse returns to its normal level.  · After 8 hours, carbon monoxide levels in the blood return to normal. Your oxygen level increases.  · After 24 hours, the chance of having a heart attack starts to decrease. Your breath, hair, and body stop smelling like smoke.  · After 48 hours, damaged nerve endings begin to recover. Your sense of taste and smell improve.  · After 72 hours, the body is virtually free of nicotine. Your bronchial tubes relax and breathing becomes easier.  · After 2 to 12 weeks, lungs can hold more air. Exercise becomes easier and circulation improves.  · The risk of having a heart attack, stroke, cancer, or lung disease is greatly reduced.  · After 1 year, the risk of coronary heart disease is cut in half.  · After 5 years, the risk of stroke falls to the same as a nonsmoker.  · After 10 years, the risk of lung cancer is cut in half and the risk of other cancers decreases significantly.  · After 15 years, the risk of coronary heart disease drops, usually to the level of a nonsmoker.  · If you are pregnant, quitting smoking will improve your chances of having a healthy baby.  · The people you live with, especially any children, will be healthier.  · You will have extra money to spend on things other than cigarettes.  QUESTIONS TO THINK ABOUT BEFORE ATTEMPTING TO QUIT  You may want to talk about your answers with your  health care provider.  · Why do you want to quit?  · If you tried to quit in the past, what helped and what did not?  · What will be the most difficult situations for you after you quit? How will you plan to handle them?  · Who can help you through the tough times? Your family? Friends? A health care provider?  · What pleasures do you get from smoking? What ways can you still get pleasure if you quit?  Here are some questions to ask your health care provider:  · How can you help me to be successful at quitting?  · What medicine do you think would be best for me and how should I take it?  · What should I do if I need more help?  · What is smoking withdrawal like? How can I get information on withdrawal?  GET READY  · Set a quit date.  · Change your environment by getting rid of all cigarettes, ashtrays, matches, and lighters in your home, car, or work. Do not let people smoke in your home.  · Review your past attempts to quit. Think about what worked and what did not.  GET SUPPORT AND ENCOURAGEMENT  You have a better chance of being successful if you have help. You can get support in many ways.  · Tell your family, friends, and   coworkers that you are going to quit and need their support. Ask them not to smoke around you.  · Get individual, group, or telephone counseling and support. Programs are available at local hospitals and health centers. Call your local health department for information about programs in your area.  · Spiritual beliefs and practices may help some smokers quit.  · Download a "quit meter" on your computer to keep track of quit statistics, such as how long you have gone without smoking, cigarettes not smoked, and money saved.  · Get a self-help book about quitting smoking and staying off tobacco.  LEARN NEW SKILLS AND BEHAVIORS  · Distract yourself from urges to smoke. Talk to someone, go for a walk, or occupy your time with a task.  · Change your normal routine. Take a different route to work.  Drink tea instead of coffee. Eat breakfast in a different place.  · Reduce your stress. Take a hot bath, exercise, or read a book.  · Plan something enjoyable to do every day. Reward yourself for not smoking.  · Explore interactive web-based programs that specialize in helping you quit.  GET MEDICINE AND USE IT CORRECTLY  Medicines can help you stop smoking and decrease the urge to smoke. Combining medicine with the above behavioral methods and support can greatly increase your chances of successfully quitting smoking.  · Nicotine replacement therapy helps deliver nicotine to your body without the negative effects and risks of smoking. Nicotine replacement therapy includes nicotine gum, lozenges, inhalers, nasal sprays, and skin patches. Some may be available over-the-counter and others require a prescription.  · Antidepressant medicine helps people abstain from smoking, but how this works is unknown. This medicine is available by prescription.  · Nicotinic receptor partial agonist medicine simulates the effect of nicotine in your brain. This medicine is available by prescription.  Ask your health care provider for advice about which medicines to use and how to use them based on your health history. Your health care provider will tell you what side effects to look out for if you choose to be on a medicine or therapy. Carefully read the information on the package. Do not use any other product containing nicotine while using a nicotine replacement product.   RELAPSE OR DIFFICULT SITUATIONS  Most relapses occur within the first 3 months after quitting. Do not be discouraged if you start smoking again. Remember, most people try several times before finally quitting. You may have symptoms of withdrawal because your body is used to nicotine. You may crave cigarettes, be irritable, feel very hungry, cough often, get headaches, or have difficulty concentrating. The withdrawal symptoms are only temporary. They are strongest  when you first quit, but they will go away within 10-14 days.  To reduce the chances of relapse, try to:  · Avoid drinking alcohol. Drinking lowers your chances of successfully quitting.  · Reduce the amount of caffeine you consume. Once you quit smoking, the amount of caffeine in your body increases and can give you symptoms, such as a rapid heartbeat, sweating, and anxiety.  · Avoid smokers because they can make you want to smoke.  · Do not let weight gain distract you. Many smokers will gain weight when they quit, usually less than 10 pounds. Eat a healthy diet and stay active. You can always lose the weight gained after you quit.  · Find ways to improve your mood other than smoking.  FOR MORE INFORMATION   www.smokefree.gov   Document Released:   07/11/2001 Document Revised: 12/01/2013 Document Reviewed: 10/26/2011  ExitCare® Patient Information ©2015 [employer]. This information is not intended to replace advice given to you by your health care provider. Make sure you discuss any questions you have with your health care provider.  Smoking Cessation, Tips for Success  If you are ready to quit smoking, congratulations! You have chosen to help yourself be healthier. Cigarettes bring nicotine, tar, carbon monoxide, and other irritants into your body. Your lungs, heart, and blood vessels will be able to work better without these poisons. There are many different ways to quit smoking. Nicotine gum, nicotine patches, a nicotine inhaler, or nicotine nasal spray can help with physical craving. Hypnosis, support groups, and medicines help break the habit of smoking.  WHAT THINGS CAN I DO TO MAKE QUITTING EASIER?   Here are some tips to help you quit for good:  · Pick a date when you will quit smoking completely. Tell all of your friends and family about your plan to quit on that date.  · Do not try to slowly cut down on the number of cigarettes you are smoking. Pick a quit date and quit smoking completely starting on  that day.  · Throw away all cigarettes.    · Clean and remove all ashtrays from your home, work, and car.  · On a card, write down your reasons for quitting. Carry the card with you and read it when you get the urge to smoke.  · Cleanse your body of nicotine. Drink enough water and fluids to keep your urine clear or pale yellow. Do this after quitting to flush the nicotine from your body.  · Learn to predict your moods. Do not let a bad situation be your excuse to have a cigarette. Some situations in your life might tempt you into wanting a cigarette.  · Never have "just one" cigarette. It leads to wanting another and another. Remind yourself of your decision to quit.  · Change habits associated with smoking. If you smoked while driving or when feeling stressed, try other activities to replace smoking. Stand up when drinking your coffee. Brush your teeth after eating. Sit in a different chair when you read the paper. Avoid alcohol while trying to quit, and try to drink fewer caffeinated beverages. Alcohol and caffeine may urge you to smoke.  · Avoid foods and drinks that can trigger a desire to smoke, such as sugary or spicy foods and alcohol.  · Ask people who smoke not to smoke around you.  · Have something planned to do right after eating or having a cup of coffee. For example, plan to take a walk or exercise.  · Try a relaxation exercise to calm you down and decrease your stress. Remember, you may be tense and nervous for the first 2 weeks after you quit, but this will pass.  · Find new activities to keep your hands busy. Play with a pen, coin, or rubber band. Doodle or draw things on paper.  · Brush your teeth right after eating. This will help cut down on the craving for the taste of tobacco after meals. You can also try mouthwash.    · Use oral substitutes in place of cigarettes. Try using lemon drops, carrots, cinnamon sticks, or chewing gum. Keep them handy so they are available when you have the urge to  smoke.  · When you have the urge to smoke, try deep breathing.  · Designate your home as a nonsmoking area.  ·   If you are a heavy smoker, ask your health care provider about a prescription for nicotine chewing gum. It can ease your withdrawal from nicotine.  · Reward yourself. Set aside the cigarette money you save and buy yourself something nice.  · Look for support from others. Join a support group or smoking cessation program. Ask someone at home or at work to help you with your plan to quit smoking.  · Always ask yourself, "Do I need this cigarette or is this just a reflex?" Tell yourself, "Today, I choose not to smoke," or "I do not want to smoke." You are reminding yourself of your decision to quit.  · Do not replace cigarette smoking with electronic cigarettes (commonly called e-cigarettes). The safety of e-cigarettes is unknown, and some may contain harmful chemicals.  · If you relapse, do not give up! Plan ahead and think about what you will do the next time you get the urge to smoke.  HOW WILL I FEEL WHEN I QUIT SMOKING?  You may have symptoms of withdrawal because your body is used to nicotine (the addictive substance in cigarettes). You may crave cigarettes, be irritable, feel very hungry, cough often, get headaches, or have difficulty concentrating. The withdrawal symptoms are only temporary. They are strongest when you first quit but will go away within 10-14 days. When withdrawal symptoms occur, stay in control. Think about your reasons for quitting. Remind yourself that these are signs that your body is healing and getting used to being without cigarettes. Remember that withdrawal symptoms are easier to treat than the major diseases that smoking can cause.   Even after the withdrawal is over, expect periodic urges to smoke. However, these cravings are generally short lived and will go away whether you smoke or not. Do not smoke!  WHAT RESOURCES ARE AVAILABLE TO HELP ME QUIT SMOKING?  Your health care  provider can direct you to community resources or hospitals for support, which may include:  · Group support.  · Education.  · Hypnosis.  · Therapy.  Document Released: 04/14/2004 Document Revised: 12/01/2013 Document Reviewed: 01/02/2013  ExitCare® Patient Information ©2015 [employer]. This information is not intended to replace advice given to you by your health care provider. Make sure you discuss any questions you have with your health care provider.

## 2015-03-04 NOTE — Addendum Note (Signed)
Addended by: Dorthula Rue L on: 03/04/2015 11:34 AM   Modules accepted: Orders

## 2015-04-14 ENCOUNTER — Encounter: Payer: Self-pay | Admitting: Cardiology

## 2015-04-14 ENCOUNTER — Ambulatory Visit (INDEPENDENT_AMBULATORY_CARE_PROVIDER_SITE_OTHER): Payer: Commercial Managed Care - HMO | Admitting: Cardiology

## 2015-04-14 VITALS — BP 98/62 | HR 57 | Ht 68.0 in | Wt 156.0 lb

## 2015-04-14 DIAGNOSIS — Z720 Tobacco use: Secondary | ICD-10-CM

## 2015-04-14 DIAGNOSIS — I252 Old myocardial infarction: Secondary | ICD-10-CM

## 2015-04-14 DIAGNOSIS — I10 Essential (primary) hypertension: Secondary | ICD-10-CM | POA: Diagnosis not present

## 2015-04-14 DIAGNOSIS — I5022 Chronic systolic (congestive) heart failure: Secondary | ICD-10-CM | POA: Diagnosis not present

## 2015-04-14 DIAGNOSIS — I723 Aneurysm of iliac artery: Secondary | ICD-10-CM | POA: Insufficient documentation

## 2015-04-14 DIAGNOSIS — I255 Ischemic cardiomyopathy: Secondary | ICD-10-CM | POA: Diagnosis not present

## 2015-04-14 MED ORDER — ASPIRIN EC 81 MG PO TBEC: 81.0000 mg | DELAYED_RELEASE_TABLET | Freq: Every day | ORAL | Status: DC

## 2015-04-14 NOTE — Progress Notes (Addendum)
Cardiology Office Note   Date:  04/14/2015   ID:  Ruben Young, DOB May 18, 1951, MRN [id number]  PCP:  Maggie Font, MD  Cardiologist:  Dr. Candee Furbish     History of Present Illness: Ruben Young is a 64 y.o. male with a hx of CAD, s/p PCI to RCA in the past, anterior STEMI in 08/2012 tx with BMS to LAD, ischemic CM,  chronic systolic heart failure ,HTN, HL.   He returns for further management of his HTN.  Blood pressure is doing better now that he is back on the losartan. The patient denies chest pain, syncope, orthopnea, PND or significant pedal edema.   He is still smoking although he is smoking less.   Feels some SOB.  Cut grass stop 10 min.    2.63 cm left common iliac artery aneurysm, Dr. Scot Dock, normally consider elective repair with reaches 3.5 cm.  Studies: - LHC (08/2012): Ant STEMI >>> mid LAD occluded, mid RCA stent ok. PCI: BMS to mid LAD.  - Echo (08/2012): Mild LVH, EF 40-45%, AS dyskinesis, Gr 1 DD.   Recent Labs: 04/14/2014: LDL Cholesterol 63 11/08/2014: ALT 12 11/09/2014: BUN 13; Creatinine, Ser 1.26; Hemoglobin 11.9*; Potassium 3.4*; Sodium 138    Recent Radiology: No results found.    Wt Readings from Last 3 Encounters:  04/14/15 156 lb (70.761 kg)  03/03/15 155 lb (70.308 kg)  11/08/14 159 lb (72.122 kg)     Past Medical History  Diagnosis Date  . MI (myocardial infarction)   . DDD (degenerative disc disease), lumbar   . Hypertension   . Hyperlipidemia   . Old myocardial infarction   . Tobacco use disorder     Current Outpatient Prescriptions  Medication Sig Dispense Refill  . amLODipine (NORVASC) 10 MG tablet Take 1 tablet (10 mg total) by mouth daily. 30 tablet 11  . atorvastatin (LIPITOR) 40 MG tablet Take 1 tablet (40 mg total) by mouth daily at 6 PM. 30 tablet 11  . carvedilol (COREG) 6.25 MG tablet Take 1 tablet (6.25 mg total) by mouth 2 (two) times daily. 60 tablet 11  . nitroGLYCERIN (NITROSTAT) 0.4 MG SL tablet  Place 0.4 mg under the tongue every 5 (five) minutes as needed. For chest pains    . aspirin 325 MG EC tablet Take 325 mg by mouth daily.     No current facility-administered medications for this visit.     Allergies:   Review of patient's allergies indicates no known allergies.   Social History:  The patient  reports that he has been smoking Cigarettes.  He has a 4.4 pack-year smoking history. He has never used smokeless tobacco. He reports that he does not drink alcohol or use illicit drugs.   Family History:  The patient's family history includes Cancer in his mother; Heart attack in his mother.    ROS:  Please see the history of present illness.  In eyes any bleeding, syncope, orthopnea, PND. Positive for some shortness of breath with activity, back pain, noticed what in stool occasionally.     All other systems reviewed and negative.    PHYSICAL EXAM: VS:  BP 98/62 mmHg  Pulse 57  Ht 5\' 8"  (1.727 m)  Wt 156 lb (70.761 kg)  BMI 23.73 kg/m2 Well nourished, well developed, in no acute distress HEENT: normal Neck:  no JVD Cardiac:  normal S1, S2;  RRR;  no murmur   Lungs:   clear to auscultation bilaterally, no wheezing, rhonchi  or rales Abd: soft, nontender, no hepatomegaly Ext:  no edema Skin: warm and dry Neuro:  CNs 2-12 intact, no focal abnormalities noted    Echo  09/25/12-EF 40-45%   EKG : Today 04/14/15-sinus bradycardia rate 57, otherwise normal.  ASSESSMENT AND PLAN:   1.  Hypertension:   A little low this morning, he thinks he developed on his medications last night and this morning. Continue to monitor.    2.  Coronary Artery Disease:  No angina.     -  Continue ASA , decreased 81 mg, statin, beta blocker. 3.  Ischemic Cardiomyopathy /chronic systolic heart failure , class 1-2:  EF 40-45% in 2014.  He does not appear to be volume overloaded. His pressure is quite low this morning. I do not wish to add Lasix at this time. I think some of his shortness of breath  with activity could also be related to years of smoking.    -  Continue ARB, beta blocker.   Appears volume neutral 4.  Hyperlipidemia:  Continue statin.   04/14/2014: HDL 29.70*; LDL Cholesterol 63 11/08/2014: ALT 12  excellent 5.  Tobacco Abuse:  He knows he needs to quit.  He states that he has cut back significantly.  6. Iliac artery aneurysm -right common iliac. Followed by vascular , Dr. Scot Dock. 2.3 cm.  Disposition:   FU in 6 months    Signed, Candee Furbish, MD   04/14/2015 10:02 AM    [facility] [address], [place], [place]  [address] Phone: [phone]; Fax: [phone]

## 2015-04-14 NOTE — Patient Instructions (Signed)
Medication Instructions:  Please decrease your ASA to 81 mg a day. Continue all other medications as listed.  Please continue to monitor your blood pressure at home and notify us if it remains low. Follow-Up: Follow up in 6 months with Dr. Marlou Porch.  You will receive a letter in the mail 2 months before you are due.  Please call us when you receive this letter to schedule your follow up appointment.  Thank you for choosing [facility]!!

## 2015-05-05 ENCOUNTER — Other Ambulatory Visit: Payer: Self-pay | Admitting: Physician Assistant

## 2015-05-21 ENCOUNTER — Encounter: Payer: Self-pay | Admitting: Family

## 2015-05-26 ENCOUNTER — Ambulatory Visit (INDEPENDENT_AMBULATORY_CARE_PROVIDER_SITE_OTHER): Payer: Commercial Managed Care - HMO | Admitting: Family

## 2015-05-26 ENCOUNTER — Ambulatory Visit (HOSPITAL_COMMUNITY)
Admission: RE | Admit: 2015-05-26 | Discharge: 2015-05-26 | Disposition: A | Discharge status: Home / Self Care | Payer: Commercial Managed Care - HMO | Source: Ambulatory Visit | Attending: Family | Admitting: Family

## 2015-05-26 ENCOUNTER — Encounter: Payer: Self-pay | Admitting: Family

## 2015-05-26 VITALS — BP 128/80 | HR 55 | Temp 97.8°F | Ht 68.0 in | Wt 162.3 lb

## 2015-05-26 DIAGNOSIS — Z720 Tobacco use: Secondary | ICD-10-CM

## 2015-05-26 DIAGNOSIS — I723 Aneurysm of iliac artery: Secondary | ICD-10-CM | POA: Diagnosis not present

## 2015-05-26 DIAGNOSIS — R1032 Left lower quadrant pain: Principal | ICD-10-CM

## 2015-05-26 DIAGNOSIS — R1030 Lower abdominal pain, unspecified: Secondary | ICD-10-CM | POA: Diagnosis not present

## 2015-05-26 DIAGNOSIS — F17200 Nicotine dependence, unspecified, uncomplicated: Secondary | ICD-10-CM

## 2015-05-26 DIAGNOSIS — R1031 Right lower quadrant pain: Secondary | ICD-10-CM

## 2015-05-26 NOTE — Addendum Note (Signed)
Addended by: Dorthula Rue L on: 05/26/2015 03:45 PM   Modules accepted: Orders

## 2015-05-26 NOTE — Patient Instructions (Signed)
Smoking Cessation, Tips for Success If you are ready to quit smoking, congratulations! You have chosen to help yourself be healthier. Cigarettes bring nicotine, tar, carbon monoxide, and other irritants into your body. Your lungs, heart, and blood vessels will be able to work better without these poisons. There are many different ways to quit smoking. Nicotine gum, nicotine patches, a nicotine inhaler, or nicotine nasal spray can help with physical craving. Hypnosis, support groups, and medicines help break the habit of smoking. WHAT THINGS CAN I DO TO MAKE QUITTING EASIER?  Here are some tips to help you quit for good:  Pick a date when you will quit smoking completely. Tell all of your friends and family about your plan to quit on that date.  Do not try to slowly cut down on the number of cigarettes you are smoking. Pick a quit date and quit smoking completely starting on that day.  Throw away all cigarettes.   Clean and remove all ashtrays from your home, work, and car.  On a card, write down your reasons for quitting. Carry the card with you and read it when you get the urge to smoke.  Cleanse your body of nicotine. Drink enough water and fluids to keep your urine clear or pale yellow. Do this after quitting to flush the nicotine from your body.  Learn to predict your moods. Do not let a bad situation be your excuse to have a cigarette. Some situations in your life might tempt you into wanting a cigarette.  Never have "just one" cigarette. It leads to wanting another and another. Remind yourself of your decision to quit.  Change habits associated with smoking. If you smoked while driving or when feeling stressed, try other activities to replace smoking. Stand up when drinking your coffee. Brush your teeth after eating. Sit in a different chair when you read the paper. Avoid alcohol while trying to quit, and try to drink fewer caffeinated beverages. Alcohol and caffeine may urge you to  smoke.  Avoid foods and drinks that can trigger a desire to smoke, such as sugary or spicy foods and alcohol.  Ask people who smoke not to smoke around you.  Have something planned to do right after eating or having a cup of coffee. For example, plan to take a walk or exercise.  Try a relaxation exercise to calm you down and decrease your stress. Remember, you may be tense and nervous for the first 2 weeks after you quit, but this will pass.  Find new activities to keep your hands busy. Play with a pen, coin, or rubber band. Doodle or draw things on paper.  Brush your teeth right after eating. This will help cut down on the craving for the taste of tobacco after meals. You can also try mouthwash.   Use oral substitutes in place of cigarettes. Try using lemon drops, carrots, cinnamon sticks, or chewing gum. Keep them handy so they are available when you have the urge to smoke.  When you have the urge to smoke, try deep breathing.  Designate your home as a nonsmoking area.  If you are a heavy smoker, ask your health care provider about a prescription for nicotine chewing gum. It can ease your withdrawal from nicotine.  Reward yourself. Set aside the cigarette money you save and buy yourself something nice.  Look for support from others. Join a support group or smoking cessation program. Ask someone at home or at work to help you with your plan   to quit smoking.  Always ask yourself, "Do I need this cigarette or is this just a reflex?" Tell yourself, "Today, I choose not to smoke," or "I do not want to smoke." You are reminding yourself of your decision to quit.  Do not replace cigarette smoking with electronic cigarettes (commonly called e-cigarettes). The safety of e-cigarettes is unknown, and some may contain harmful chemicals.  If you relapse, do not give up! Plan ahead and think about what you will do the next time you get the urge to smoke. HOW WILL I FEEL WHEN I QUIT SMOKING? You  may have symptoms of withdrawal because your body is used to nicotine (the addictive substance in cigarettes). You may crave cigarettes, be irritable, feel very hungry, cough often, get headaches, or have difficulty concentrating. The withdrawal symptoms are only temporary. They are strongest when you first quit but will go away within 10-14 days. When withdrawal symptoms occur, stay in control. Think about your reasons for quitting. Remind yourself that these are signs that your body is healing and getting used to being without cigarettes. Remember that withdrawal symptoms are easier to treat than the major diseases that smoking can cause.  Even after the withdrawal is over, expect periodic urges to smoke. However, these cravings are generally short lived and will go away whether you smoke or not. Do not smoke! WHAT RESOURCES ARE AVAILABLE TO HELP ME QUIT SMOKING? Your health care provider can direct you to community resources or hospitals for support, which may include:  Group support.  Education.  Hypnosis.  Therapy.   This information is not intended to replace advice given to you by your health care provider. Make sure you discuss any questions you have with your health care provider.   Document Released: 04/14/2004 Document Revised: 08/07/2014 Document Reviewed: 01/02/2013 Elsevier Interactive Patient Education 2016 Elsevier Inc.    Steps to Quit Smoking  Smoking tobacco can be harmful to your health and can affect almost every organ in your body. Smoking puts you, and those around you, at risk for developing many serious chronic diseases. Quitting smoking is difficult, but it is one of the best things that you can do for your health. It is never too late to quit. WHAT ARE THE BENEFITS OF QUITTING SMOKING? When you quit smoking, you lower your risk of developing serious diseases and conditions, such as:  Lung cancer or lung disease, such as COPD.  Heart disease.  Stroke.  Heart  attack.  Infertility.  Osteoporosis and bone fractures. Additionally, symptoms such as coughing, wheezing, and shortness of breath may get better when you quit. You may also find that you get sick less often because your body is stronger at fighting off colds and infections. If you are pregnant, quitting smoking can help to reduce your chances of having a baby of low birth weight. HOW DO I GET READY TO QUIT? When you decide to quit smoking, create a plan to make sure that you are successful. Before you quit:  Pick a date to quit. Set a date within the next two weeks to give you time to prepare.  Write down the reasons why you are quitting. Keep this list in places where you will see it often, such as on your bathroom mirror or in your car or wallet.  Identify the people, places, things, and activities that make you want to smoke (triggers) and avoid them. Make sure to take these actions:  Throw away all cigarettes at home, at work,   and in your car.  Throw away smoking accessories, such as ashtrays and lighters.  Clean your car and make sure to empty the ashtray.  Clean your home, including curtains and carpets.  Tell your family, friends, and coworkers that you are quitting. Support from your loved ones can make quitting easier.  Talk with your health care provider about your options for quitting smoking.  Find out what treatment options are covered by your health insurance. WHAT STRATEGIES CAN I USE TO QUIT SMOKING?  Talk with your healthcare provider about different strategies to quit smoking. Some strategies include:  Quitting smoking altogether instead of gradually lessening how much you smoke over a period of time. Research shows that quitting "cold turkey" is more successful than gradually quitting.  Attending in-person counseling to help you build problem-solving skills. You are more likely to have success in quitting if you attend several counseling sessions. Even short  sessions of 10 minutes can be effective.  Finding resources and support systems that can help you to quit smoking and remain smoke-free after you quit. These resources are most helpful when you use them often. They can include:  Online chats with a counselor.  Telephone quitlines.  Printed self-help materials.  Support groups or group counseling.  Text messaging programs.  Mobile phone applications.  Taking medicines to help you quit smoking. (If you are pregnant or breastfeeding, talk with your health care provider first.) Some medicines contain nicotine and some do not. Both types of medicines help with cravings, but the medicines that include nicotine help to relieve withdrawal symptoms. Your health care provider may recommend:  Nicotine patches, gum, or lozenges.  Nicotine inhalers or sprays.  Non-nicotine medicine that is taken by mouth. Talk with your health care provider about combining strategies, such as taking medicines while you are also receiving in-person counseling. Using these two strategies together makes you more likely to succeed in quitting than if you used either strategy on its own. If you are pregnant or breastfeeding, talk with your health care provider about finding counseling or other support strategies to quit smoking. Do not take medicine to help you quit smoking unless told to do so by your health care provider. WHAT THINGS CAN I DO TO MAKE IT EASIER TO QUIT? Quitting smoking might feel overwhelming at first, but there is a lot that you can do to make it easier. Take these important actions:  Reach out to your family and friends and ask that they support and encourage you during this time. Call telephone quitlines, reach out to support groups, or work with a counselor for support.  Ask people who smoke to avoid smoking around you.  Avoid places that trigger you to smoke, such as bars, parties, or smoke-break areas at work.  Spend time around people who do  not smoke.  Lessen stress in your life, because stress can be a smoking trigger for some people. To lessen stress, try:  Exercising regularly.  Deep-breathing exercises.  Yoga.  Meditating.  Performing a body scan. This involves closing your eyes, scanning your body from head to toe, and noticing which parts of your body are particularly tense. Purposefully relax the muscles in those areas.  Download or purchase mobile phone or tablet apps (applications) that can help you stick to your quit plan by providing reminders, tips, and encouragement. There are many free apps, such as QuitGuide from the CDC (Centers for Disease Control and Prevention). You can find other support for quitting smoking (smoking   cessation) through smokefree.gov and other websites. HOW WILL I FEEL WHEN I QUIT SMOKING? Within the first 24 hours of quitting smoking, you may start to feel some withdrawal symptoms. These symptoms are usually most noticeable 2-3 days after quitting, but they usually do not last beyond 2-3 weeks. Changes or symptoms that you might experience include:  Mood swings.  Restlessness, anxiety, or irritation.  Difficulty concentrating.  Dizziness.  Strong cravings for sugary foods in addition to nicotine.  Mild weight gain.  Constipation.  Nausea.  Coughing or a sore throat.  Changes in how your medicines work in your body.  A depressed mood.  Difficulty sleeping (insomnia). After the first 2-3 weeks of quitting, you may start to notice more positive results, such as:  Improved sense of smell and taste.  Decreased coughing and sore throat.  Slower heart rate.  Lower blood pressure.  Clearer skin.  The ability to breathe more easily.  Fewer sick days. Quitting smoking is very challenging for most people. Do not get discouraged if you are not successful the first time. Some people need to make many attempts to quit before they achieve long-term success. Do your best to  stick to your quit plan, and talk with your health care provider if you have any questions or concerns.   This information is not intended to replace advice given to you by your health care provider. Make sure you discuss any questions you have with your health care provider.   Document Released: 07/11/2001 Document Revised: 12/01/2014 Document Reviewed: 12/01/2014 Elsevier Interactive Patient Education 2016 Elsevier Inc.  
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
## 2015-05-31 DIAGNOSIS — I10 Essential (primary) hypertension: Secondary | ICD-10-CM | POA: Diagnosis not present

## 2015-05-31 DIAGNOSIS — Z23 Encounter for immunization: Secondary | ICD-10-CM | POA: Diagnosis not present

## 2015-05-31 DIAGNOSIS — Z6824 Body mass index (BMI) 24.0-24.9, adult: Secondary | ICD-10-CM | POA: Diagnosis not present

## 2015-06-29 DIAGNOSIS — F12920 Cannabis use, unspecified with intoxication, uncomplicated: Secondary | ICD-10-CM | POA: Diagnosis not present

## 2015-06-29 DIAGNOSIS — I252 Old myocardial infarction: Secondary | ICD-10-CM | POA: Diagnosis not present

## 2015-06-29 DIAGNOSIS — Z0000 Encounter for general adult medical examination without abnormal findings: Secondary | ICD-10-CM | POA: Diagnosis not present

## 2015-06-29 DIAGNOSIS — R7303 Prediabetes: Secondary | ICD-10-CM | POA: Diagnosis not present

## 2015-06-29 DIAGNOSIS — I2510 Atherosclerotic heart disease of native coronary artery without angina pectoris: Secondary | ICD-10-CM | POA: Diagnosis not present

## 2015-06-29 DIAGNOSIS — E782 Mixed hyperlipidemia: Secondary | ICD-10-CM | POA: Diagnosis not present

## 2015-06-29 DIAGNOSIS — I10 Essential (primary) hypertension: Secondary | ICD-10-CM | POA: Diagnosis not present

## 2015-06-29 DIAGNOSIS — Z6824 Body mass index (BMI) 24.0-24.9, adult: Secondary | ICD-10-CM | POA: Diagnosis not present

## 2015-08-30 DIAGNOSIS — L0233 Carbuncle of buttock: Secondary | ICD-10-CM | POA: Diagnosis not present

## 2015-08-30 DIAGNOSIS — I10 Essential (primary) hypertension: Secondary | ICD-10-CM | POA: Diagnosis not present

## 2015-09-01 ENCOUNTER — Other Ambulatory Visit: Payer: Self-pay

## 2015-09-01 DIAGNOSIS — I10 Essential (primary) hypertension: Secondary | ICD-10-CM

## 2015-09-01 MED ORDER — CARVEDILOL 6.25 MG PO TABS: 6.2500 mg | ORAL_TABLET | Freq: Two times a day (BID) | ORAL | Status: DC

## 2015-09-02 DIAGNOSIS — L0231 Cutaneous abscess of buttock: Secondary | ICD-10-CM | POA: Diagnosis not present

## 2015-09-07 ENCOUNTER — Other Ambulatory Visit: Payer: Self-pay | Admitting: Physician Assistant

## 2015-09-07 DIAGNOSIS — L0231 Cutaneous abscess of buttock: Secondary | ICD-10-CM

## 2015-09-08 ENCOUNTER — Telehealth: Payer: Self-pay

## 2015-09-08 ENCOUNTER — Ambulatory Visit: Payer: Commercial Managed Care - HMO | Admitting: Family

## 2015-09-08 ENCOUNTER — Encounter (HOSPITAL_COMMUNITY): Payer: Commercial Managed Care - HMO

## 2015-09-08 NOTE — Telephone Encounter (Signed)
Pt left a Vm that he was ready to schedule his colonoscopy. I tried to call and could not leave a message. He has [employer] Plus and the note says on referral to contact [facility] before scheduling. Pt will need OV first due to meds.

## 2015-09-13 NOTE — Telephone Encounter (Signed)
OV with Walden Field, NP on 09/16/2015 at 8:30 Am.

## 2015-09-16 ENCOUNTER — Encounter: Payer: Self-pay | Admitting: Nurse Practitioner

## 2015-09-16 ENCOUNTER — Other Ambulatory Visit: Payer: Self-pay

## 2015-09-16 ENCOUNTER — Ambulatory Visit (INDEPENDENT_AMBULATORY_CARE_PROVIDER_SITE_OTHER): Payer: Commercial Managed Care - HMO | Admitting: Nurse Practitioner

## 2015-09-16 VITALS — BP 122/79 | HR 78 | Temp 97.5°F | Ht 68.0 in | Wt 153.6 lb

## 2015-09-16 DIAGNOSIS — Z1211 Encounter for screening for malignant neoplasm of colon: Secondary | ICD-10-CM

## 2015-09-16 DIAGNOSIS — K921 Melena: Secondary | ICD-10-CM | POA: Diagnosis not present

## 2015-09-16 MED ORDER — PEG 3350-KCL-NA BICARB-NACL 420 G PO SOLR: 4000.0000 mL | ORAL | Status: DC

## 2015-09-16 NOTE — Patient Instructions (Signed)
1. We will schedule your procedure for you. 2. Further recommendations to be based on the results of your procedure. 3. Return for follow-up as needed or sooner depending on postprocedure recommendations.

## 2015-09-16 NOTE — Progress Notes (Signed)
PA# [id number] for TCS
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
## 2015-09-16 NOTE — Assessment & Plan Note (Signed)
Patient due for first-ever screening colonoscopy. Generally asymptomatic from a GI standpoint except for rare hematochezia as noted below. We'll proceed with the colonoscopy as indicated. Return for follow-up as needed or based on postprocedure recommendations.  Proceed with colonoscopy with Dr. Oneida Alar in the near future. The risks, benefits, and alternatives have been discussed in detail with the patient. They state understanding and desire to proceed.   The patient is not on any anticoagulants, anxiolytics, chronic pain medications, or antidepressants. Was previously on chronic pain medication but he states he has not taken this in 5-10 years. Conscious sedation should likely be adequate for his procedure.

## 2015-09-16 NOTE — Progress Notes (Signed)
Primary Care Physician:  Maggie Font, MD Primary Gastroenterologist:  Dr. Oneida Alar  Chief Complaint  Patient presents with  . set up procedure    HPI:   Ruben Young is a 65 y.o. male who presents for office visit to schedule colonoscopy due to polypharmacy. PCP ntoes reviewed. Per PCP notes patient on Hydrocodone, oxycodone, and rozerem. No history of colonoscopy in our system.  Today he states he has never had a colonoscopy and was recommended to do so by surgery rpior to removal of boils on his left buttock that "they said are on a tract and they want to make sure they don't involve my colon." Denies abdominal pain, N/V. Admits hematochezia rarely with history of hemorrhoids mostly toilet tissue hematochezia but occasionally in the water. No pain or irritation associatd wih hematochezia. Denies melena, fever, chills, changes in bowel habits, unintentional weight loss. Occasional dyspnea with excessive exertion 'but not much, nothing serious." Denies chest pain, dizziness, lightheadedness, syncope, near syncope. Denies any other upper or lower GI symptoms.  Has not used ntg SL tablets in a couple years. Has not used pain medications in "probably 5-10 years."   Past Medical History  Diagnosis Date  . MI (myocardial infarction) ([place])   . DDD (degenerative disc disease), lumbar   . Hypertension   . Hyperlipidemia   . Old myocardial infarction   . Tobacco use disorder     Past Surgical History  Procedure Laterality Date  . Tonsillectomy    . Coronary angioplasty with stent placement    . Left heart catheterization with coronary angiogram N/A 09/24/2012    Procedure: LEFT HEART CATHETERIZATION WITH CORONARY ANGIOGRAM;  Surgeon: Jettie Booze, MD;  Location: [facility] CATH LAB;  Service: Cardiovascular;  Laterality: N/A;    Current Outpatient Prescriptions  Medication Sig Dispense Refill  . amLODipine (NORVASC) 10 MG tablet Take 1 tablet (10 mg total) by mouth daily. 30 tablet  11  . aspirin 81 MG tablet Take 1 tablet (81 mg total) by mouth daily.    Marland Kitchen atorvastatin (LIPITOR) 40 MG tablet take 1 tablet AT 6PM. 120 tablet 1  . carvedilol (COREG) 6.25 MG tablet Take 1 tablet (6.25 mg total) by mouth 2 (two) times daily. 180 tablet 0  . cephALEXin (KEFLEX) 500 MG capsule take 1 capsule by mouth four times a day  0  . nitroGLYCERIN (NITROSTAT) 0.4 MG SL tablet Place 0.4 mg under the tongue every 5 (five) minutes as needed. For chest pains     No current facility-administered medications for this visit.    Allergies as of 09/16/2015  . (No Known Allergies)    Family History  Problem Relation Age of Onset  . Cancer Mother   . Heart attack Mother     Social History   Social History  . Marital Status: Single    Spouse Name: N/A  . Number of Children: N/A  . Years of Education: N/A   Occupational History  . Not on file.   Social History Main Topics  . Smoking status: Current Some Day Smoker -- 0.10 packs/day for 44 years    Types: Cigarettes    Last Attempt to Quit: 10/25/2014  . Smokeless tobacco: Never Used     Comment: only smokes 2-3 per day sometimes none  . Alcohol Use: No  . Drug Use: No     Comment: per patient has not used in 4 years  . Sexual Activity: Not on file   Other  Topics Concern  . Not on file   Social History Narrative    Review of Systems: 10-point ROS negative except as per HPI.    Physical Exam: BP 122/79 mmHg  Pulse 78  Temp(Src) 97.5 F (36.4 C)  Ht 5\' 8"  (1.727 m)  Wt 153 lb 9.6 oz (69.673 kg)  BMI 23.36 kg/m2 General:   Alert and oriented. Pleasant and cooperative. Well-nourished and well-developed.  Head:  Normocephalic and atraumatic. Eyes:  Without icterus, sclera clear and conjunctiva pink.  Ears:  Normal auditory acuity. Cardiovascular:  S1, S2 present without murmurs appreciated. Extremities without clubbing or edema. Respiratory:  Clear to auscultation bilaterally. No wheezes, rales, or rhonchi. No  distress.  Gastrointestinal:  +BS, soft, non-tender and non-distended. No HSM noted. No guarding or rebound. No masses appreciated.  Rectal:  Deferred  Musculoskalatal:  Symmetrical without gross deformities. Skin:  Intact without significant lesions or rashes. Neurologic:  Alert and oriented x4;  grossly normal neurologically. Psych:  Alert and cooperative. Normal mood and affect. Heme/Lymph/Immune: No excessive bruising noted.    09/16/2015 8:50 AM

## 2015-09-16 NOTE — Progress Notes (Signed)
cc'ed to pcp °

## 2015-10-01 ENCOUNTER — Ambulatory Visit (HOSPITAL_COMMUNITY)
Admission: RE | Admit: 2015-10-01 | Discharge: 2015-10-01 | Disposition: A | Discharge status: Home / Self Care | Payer: Commercial Managed Care - HMO | Source: Ambulatory Visit | Attending: Gastroenterology | Admitting: Gastroenterology

## 2015-10-01 ENCOUNTER — Encounter (HOSPITAL_COMMUNITY): Payer: Self-pay

## 2015-10-01 ENCOUNTER — Encounter (HOSPITAL_COMMUNITY)
Admission: RE | Disposition: A | Discharge status: Home / Self Care | Payer: Self-pay | Source: Ambulatory Visit | Attending: Gastroenterology

## 2015-10-01 DIAGNOSIS — D125 Benign neoplasm of sigmoid colon: Secondary | ICD-10-CM | POA: Diagnosis not present

## 2015-10-01 DIAGNOSIS — K635 Polyp of colon: Secondary | ICD-10-CM | POA: Insufficient documentation

## 2015-10-01 DIAGNOSIS — D128 Benign neoplasm of rectum: Secondary | ICD-10-CM

## 2015-10-01 DIAGNOSIS — D123 Benign neoplasm of transverse colon: Secondary | ICD-10-CM | POA: Diagnosis not present

## 2015-10-01 DIAGNOSIS — Z7982 Long term (current) use of aspirin: Secondary | ICD-10-CM | POA: Insufficient documentation

## 2015-10-01 DIAGNOSIS — I10 Essential (primary) hypertension: Secondary | ICD-10-CM | POA: Diagnosis not present

## 2015-10-01 DIAGNOSIS — I252 Old myocardial infarction: Secondary | ICD-10-CM | POA: Diagnosis not present

## 2015-10-01 DIAGNOSIS — E785 Hyperlipidemia, unspecified: Secondary | ICD-10-CM | POA: Diagnosis not present

## 2015-10-01 DIAGNOSIS — K648 Other hemorrhoids: Secondary | ICD-10-CM | POA: Diagnosis not present

## 2015-10-01 DIAGNOSIS — Z79899 Other long term (current) drug therapy: Secondary | ICD-10-CM | POA: Insufficient documentation

## 2015-10-01 DIAGNOSIS — K5730 Diverticulosis of large intestine without perforation or abscess without bleeding: Secondary | ICD-10-CM | POA: Diagnosis not present

## 2015-10-01 DIAGNOSIS — F17210 Nicotine dependence, cigarettes, uncomplicated: Secondary | ICD-10-CM | POA: Diagnosis not present

## 2015-10-01 DIAGNOSIS — D124 Benign neoplasm of descending colon: Secondary | ICD-10-CM | POA: Insufficient documentation

## 2015-10-01 DIAGNOSIS — Z1211 Encounter for screening for malignant neoplasm of colon: Secondary | ICD-10-CM | POA: Insufficient documentation

## 2015-10-01 DIAGNOSIS — D120 Benign neoplasm of cecum: Secondary | ICD-10-CM | POA: Insufficient documentation

## 2015-10-01 DIAGNOSIS — K621 Rectal polyp: Secondary | ICD-10-CM | POA: Insufficient documentation

## 2015-10-01 DIAGNOSIS — K644 Residual hemorrhoidal skin tags: Secondary | ICD-10-CM | POA: Diagnosis not present

## 2015-10-01 HISTORY — PX: COLONOSCOPY: SHX5424

## 2015-10-01 SURGERY — COLONOSCOPY
Anesthesia: Moderate Sedation

## 2015-10-01 MED ORDER — MEPERIDINE HCL 100 MG/ML IJ SOLN
INTRAMUSCULAR | Status: AC
  Filled 2015-10-01: qty 2

## 2015-10-01 MED ORDER — STERILE WATER FOR IRRIGATION IR SOLN
Status: DC | PRN
  Administered 2015-10-01: 15:00:00

## 2015-10-01 MED ORDER — MIDAZOLAM HCL 5 MG/5ML IJ SOLN
INTRAMUSCULAR | Status: AC
  Filled 2015-10-01: qty 10

## 2015-10-01 MED ORDER — SODIUM CHLORIDE 0.9 % IV SOLN
INTRAVENOUS | Status: DC
  Administered 2015-10-01: 14:00:00 via INTRAVENOUS

## 2015-10-01 MED ORDER — MEPERIDINE HCL 100 MG/ML IJ SOLN
INTRAMUSCULAR | Status: DC | PRN
  Administered 2015-10-01: 50 mg via INTRAVENOUS
  Administered 2015-10-01: 25 mg via INTRAVENOUS

## 2015-10-01 MED ORDER — MIDAZOLAM HCL 5 MG/5ML IJ SOLN
INTRAMUSCULAR | Status: DC | PRN
  Administered 2015-10-01 (×2): 2 mg via INTRAVENOUS

## 2015-10-01 NOTE — H&P (Signed)
Primary Care Physician:  Maggie Font, MD Primary Gastroenterologist:  Dr. Oneida Alar  Pre-Procedure History & Physical: HPI:  Ruben Young is a 65 y.o. male here for [place].  Past Medical History  Diagnosis Date  . MI (myocardial infarction) ([place])   . DDD (degenerative disc disease), lumbar   . Hypertension   . Hyperlipidemia   . Old myocardial infarction   . Tobacco use disorder     Past Surgical History  Procedure Laterality Date  . Tonsillectomy    . Coronary angioplasty with stent placement    . Left heart catheterization with coronary angiogram N/A 09/24/2012    Procedure: LEFT HEART CATHETERIZATION WITH CORONARY ANGIOGRAM;  Surgeon: Jettie Booze, MD;  Location: [facility] CATH LAB;  Service: Cardiovascular;  Laterality: N/A;    Prior to Admission medications   Medication Sig Start Date End Date Taking? Authorizing Provider  amLODipine (NORVASC) 10 MG tablet Take 1 tablet (10 mg total) by mouth daily. 02/10/14  Yes Liliane Shi, PA-C  aspirin 81 MG tablet Take 1 tablet (81 mg total) by mouth daily. 04/14/15  Yes Jerline Pain, MD  atorvastatin (LIPITOR) 40 MG tablet take 1 tablet AT 6PM. 05/06/15  Yes Jerline Pain, MD  carvedilol (COREG) 6.25 MG tablet Take 1 tablet (6.25 mg total) by mouth 2 (two) times daily. 09/01/15  Yes Jerline Pain, MD  Cyanocobalamin (VITAMIN B 12 PO) Take 1 tablet by mouth daily.   Yes Historical Provider, MD  Multiple Vitamin (MULTIVITAMIN WITH MINERALS) TABS tablet Take 1 tablet by mouth daily.   Yes Historical Provider, MD  oxymetazoline (AFRIN) 0.05 % nasal spray Place 1 spray into both nostrils 2 (two) times daily as needed for congestion.   Yes Historical Provider, MD  polyethylene glycol-electrolytes (TRILYTE) 420 g solution Take 4,000 mLs by mouth as directed. 09/16/15  Yes Danie Binder, MD  cephALEXin (KEFLEX) 500 MG capsule take 1 capsule by mouth four times a day 08/30/15   Historical Provider, MD  nitroGLYCERIN (NITROSTAT)  0.4 MG SL tablet Place 0.4 mg under the tongue every 5 (five) minutes as needed. For chest pains    Historical Provider, MD    Allergies as of 09/16/2015  . (No Known Allergies)    Family History  Problem Relation Age of Onset  . Cancer Mother   . Heart attack Mother   . Colon cancer Neg Hx     Social History   Social History  . Marital Status: Single    Spouse Name: N/A  . Number of Children: N/A  . Years of Education: N/A   Occupational History  . Not on file.   Social History Main Topics  . Smoking status: Current Some Day Smoker -- 0.10 packs/day for 44 years    Types: Cigarettes    Last Attempt to Quit: 10/25/2014  . Smokeless tobacco: Never Used     Comment: only smokes 2-3 per day sometimes none  . Alcohol Use: No  . Drug Use: No     Comment: per patient has not used in 4 years  . Sexual Activity: Not on file   Other Topics Concern  . Not on file   Social History Narrative    Review of Systems: See HPI, otherwise negative ROS   Physical Exam: BP 149/83 mmHg  Pulse 72  Temp(Src) 97.8 F (36.6 C) (Oral)  Resp 13  SpO2 99% General:   Alert,  pleasant and cooperative in NAD Head:  Normocephalic and atraumatic. Neck:  Supple; Lungs:  Clear throughout to auscultation.    Heart:  Regular rate and rhythm. Abdomen:  Soft, nontender and nondistended. Normal bowel sounds, without guarding, and without rebound.   Neurologic:  Alert and  oriented x4;  grossly normal neurologically.  Impression/Plan:     SCREENING  Plan:  1. TCS TODAY

## 2015-10-01 NOTE — Op Note (Signed)
[facility] [address] [place], [address]   COLONOSCOPY PROCEDURE REPORT  PATIENT: Ruben Young, Ruben Young  MR#: [id number] BIRTHDATE: 03/09/1951 , 74  yrs. old GENDER: male ENDOSCOPIST: Danie Binder, MD REFERRED [id number] Hill, M.D. PROCEDURE DATE:  2015-10-06 PROCEDURE:   Colonoscopy with cold biopsy polypectomy INDICATIONS:average risk patient for colon cancer. MEDICATIONS: Demerol 75 mg IV and Versed 4 mg IV  DESCRIPTION OF PROCEDURE:    Physical exam was performed.  Informed consent was obtained from the patient after explaining the benefits, risks, and alternatives to procedure.  The patient was connected to monitor and placed in left lateral position. Continuous oxygen was provided by nasal cannula and IV medicine administered through an indwelling cannula.  After administration of sedation and rectal exam(nontender, NO PALPABLE FISTULA TRACT OR perirectal FLUID COLLECTION APPRECIATED), the patients rectum was intubated and the [id number]  colonoscope was advanced under direct visualization to the cecum.  The scope was removed slowly by carefully examining the color, texture, anatomy, and integrity mucosa on the way out.  The patient was recovered in endoscopy and discharged home in satisfactory condition. Estimated blood loss is zero unless otherwise noted in this procedure report.     COLON FINDINGS: Six sessile polyps ranging from 2 to 40mm in size were found in the descending colon, rectum, at the cecum, and hepatic flexure.  A polypectomy was performed using snare cautery. , There was moderate diverticulosis noted in the descending colon, sigmoid colon, at the cecum, and in the ascending colon with associated muscular hypertrophy.  , Moderate sized internal hemorrhoids were found.  , and Large external hemorrhoids were found.  PREP QUALITY: excellent.  CECAL W/D TIME: 24       minutes COMPLICATIONS: None  ENDOSCOPIC IMPRESSION: 1.    Six COLORECTAL polyps removed 2.   moderate diverticulosis  in the descending colon, sigmoid colon, at the cecum, and in the ascending colon 3.   Moderate sized internal hemorrhoids 4.   Large external hemorrhoids  RECOMMENDATIONS: AWAIT BIOPSY HIGH FIBER DIET NEXT TCS IN 1-3 YEARS    _______________________________ eSignedDanie Binder, MD 06-Oct-2015 3:43 PM    CPT CODES: ICD CODES:  The ICD and CPT codes recommended by this software are interpretations from the data that the clinical staff has captured with the software.  The verification of the translation of this report to the ICD and CPT codes and modifiers is the sole responsibility of the health care institution and practicing physician where this report was generated.  [employer]. will not be held responsible for the validity of the ICD and CPT codes included on this report.  AMA assumes no liability for data contained or not contained herein. CPT is a Designer, television/film set of the [employer].

## 2015-10-01 NOTE — Progress Notes (Signed)
REVIEWED-NO ADDITIONAL RECOMMENDATIONS. 

## 2015-10-01 NOTE — Discharge Instructions (Signed)
You had 6 polyps removed. You have small internal hemorrhoids and diverticulosis IN YOUR RIGHT AND LEFT COLON.   FOLLOW A HIGH FIBER DIET. AVOID ITEMS THAT CAUSE BLOATING. SEE INFO BELOW.  YOUR BIOPSY RESULTS WILL BE AVAILABLE IN MY CHART AFTER      OR MY OFFICE WILL CONTACT YOU IN 10-14 DAYS WITH YOUR RESULTS.   Next colonoscopy in 1-3 years.   Colonoscopy Care After Read the instructions outlined below and refer to this sheet in the next week. These discharge instructions provide you with general information on caring for yourself after you leave the hospital. While your treatment has been planned according to the most current medical practices available, unavoidable complications occasionally occur. If you have any problems or questions after discharge, call DR. Anuja Manka, [phone].  ACTIVITY  You may resume your regular activity, but move at a slower pace for the next 24 hours.   Take frequent rest periods for the next 24 hours.   Walking will help get rid of the air and reduce the bloated feeling in your belly (abdomen).   No driving for 24 hours (because of the medicine (anesthesia) used during the test).   You may shower.   Do not sign any important legal documents or operate any machinery for 24 hours (because of the anesthesia used during the test).    NUTRITION  Drink plenty of fluids.   You may resume your normal diet as instructed by your doctor.   Begin with a light meal and progress to your normal diet. Heavy or fried foods are harder to digest and may make you feel sick to your stomach (nauseated).   Avoid alcoholic beverages for 24 hours or as instructed.    MEDICATIONS  You may resume your normal medications.   WHAT YOU CAN EXPECT TODAY  Some feelings of bloating in the abdomen.   Passage of more gas than usual.   Spotting of blood in your stool or on the toilet paper  .  IF YOU HAD POLYPS REMOVED DURING THE COLONOSCOPY:  Eat a soft diet IF  YOU HAVE NAUSEA, BLOATING, ABDOMINAL PAIN, OR VOMITING.    FINDING OUT THE RESULTS OF YOUR TEST Not all test results are available during your visit. DR. Oneida Alar WILL CALL YOU WITHIN 14 DAYS OF YOUR PROCEDUE WITH YOUR RESULTS. Do not assume everything is normal if you have not heard from DR. Eldena Dede, CALL HER OFFICE AT [phone].  SEEK IMMEDIATE MEDICAL ATTENTION AND CALL THE OFFICE: [phone] IF:  You have more than a spotting of blood in your stool.   Your belly is swollen (abdominal distention).   You are nauseated or vomiting.   You have a temperature over 101F.   You have abdominal pain or discomfort that is severe or gets worse throughout the day.  Polyps, Colon  A polyp is extra tissue that grows inside your body. Colon polyps grow in the large intestine. The large intestine, also called the colon, is part of your digestive system. It is a long, hollow tube at the end of your digestive tract where your body makes and stores stool. Most polyps are not dangerous. They are benign. This means they are not cancerous. But over time, some types of polyps can turn into cancer. Polyps that are smaller than a pea are usually not harmful. But larger polyps could someday become or may already be cancerous. To be safe, doctors remove all polyps and test them.    PREVENTION There is not  one sure way to prevent polyps. You might be able to lower your risk of getting them if you:  Eat more fruits and vegetables and less fatty food.   Do not smoke.   Avoid alcohol.   Exercise every day.   Lose weight if you are overweight.   Eating more calcium and folate can also lower your risk of getting polyps. Some foods that are rich in calcium are milk, cheese, and broccoli. Some foods that are rich in folate are chickpeas, kidney beans, and spinach.   High-Fiber Diet A high-fiber diet changes your normal diet to include more whole grains, legumes, fruits, and vegetables. Changes in the diet  involve replacing refined carbohydrates with unrefined foods. The calorie level of the diet is essentially unchanged. The Dietary Reference Intake (recommended amount) for adult males is 38 grams per day. For adult females, it is 25 grams per day. Pregnant and lactating women should consume 28 grams of fiber per day. Fiber is the intact part of a plant that is not broken down during digestion. Functional fiber is fiber that has been isolated from the plant to provide a beneficial effect in the body. PURPOSE  Increase stool bulk.   Ease and regulate bowel movements.   Lower cholesterol.  REDUCE RISK OF COLON CANCER  INDICATIONS THAT YOU NEED MORE FIBER  Constipation and hemorrhoids.   Uncomplicated diverticulosis (intestine condition) and irritable bowel syndrome.   Weight management.   As a protective measure against hardening of the arteries (atherosclerosis), diabetes, and cancer.   GUIDELINES FOR INCREASING FIBER IN THE DIET  Start adding fiber to the diet slowly. A gradual increase of about 5 more grams (2 slices of whole-wheat bread, 2 servings of most fruits or vegetables, or 1 bowl of high-fiber cereal) per day is best. Too rapid an increase in fiber may result in constipation, flatulence, and bloating.   Drink enough water and fluids to keep your urine clear or pale yellow. Water, juice, or caffeine-free drinks are recommended. Not drinking enough fluid may cause constipation.   Eat a variety of high-fiber foods rather than one type of fiber.   Try to increase your intake of fiber through using high-fiber foods rather than fiber pills or supplements that contain small amounts of fiber.   The goal is to change the types of food eaten. Do not supplement your present diet with high-fiber foods, but replace foods in your present diet.   INCLUDE A VARIETY OF FIBER SOURCES  Replace refined and processed grains with whole grains, canned fruits with fresh fruits, and incorporate  other fiber sources. White rice, white breads, and most bakery goods contain little or no fiber.   Brown whole-grain rice, buckwheat oats, and many fruits and vegetables are all good sources of fiber. These include: broccoli, Brussels sprouts, cabbage, cauliflower, beets, sweet potatoes, white potatoes (skin on), carrots, tomatoes, eggplant, squash, berries, fresh fruits, and dried fruits.   Cereals appear to be the richest source of fiber. Cereal fiber is found in whole grains and bran. Bran is the fiber-rich outer coat of cereal grain, which is largely removed in refining. In whole-grain cereals, the bran remains. In breakfast cereals, the largest amount of fiber is found in those with "bran" in their names. The fiber content is sometimes indicated on the label.   You may need to include additional fruits and vegetables each day.   In baking, for 1 cup white flour, you may use the following substitutions:   1  cup whole-wheat flour minus 2 tablespoons.   1/2 cup white flour plus 1/2 cup whole-wheat flour.   Diverticulosis Diverticulosis is a common condition that develops when small pouches (diverticula) form in the wall of the colon. The risk of diverticulosis increases with age. It happens more often in people who eat a low-fiber diet. Most individuals with diverticulosis have no symptoms. Those individuals with symptoms usually experience belly (abdominal) pain, constipation, or loose stools (diarrhea).  HOME CARE INSTRUCTIONS  Increase the amount of fiber in your diet as directed by your caregiver or dietician. This may reduce symptoms of diverticulosis.   Drink at least 6 to 8 glasses of water each day to prevent constipation.   Try not to strain when you have a bowel movement.   Avoiding nuts and seeds to prevent complications is NOT NECESSARY.   FOODS HAVING HIGH FIBER CONTENT INCLUDE:  Fruits. Apple, peach, pear, tangerine, raisins, prunes.   Vegetables. Brussels sprouts,  asparagus, broccoli, cabbage, carrot, cauliflower, romaine lettuce, spinach, summer squash, tomato, winter squash, zucchini.   Starchy Vegetables. Baked beans, kidney beans, lima beans, split peas, lentils, potatoes (with skin).   Grains. Whole wheat bread, brown rice, bran flake cereal, plain oatmeal, white rice, shredded wheat, bran muffins.    SEEK IMMEDIATE MEDICAL CARE IF:  You develop increasing pain or severe bloating.   You have an oral temperature above 101F.   You develop vomiting or bowel movements that are bloody or black.   Hemorrhoids Hemorrhoids are dilated (enlarged) veins around the rectum. Sometimes clots will form in the veins. This makes them swollen and painful. These are called thrombosed hemorrhoids. Causes of hemorrhoids include:  Constipation.   Straining to have a bowel movement.   HEAVY LIFTING  HOME CARE INSTRUCTIONS  Eat a well balanced diet and drink 6 to 8 glasses of water every day to avoid constipation. You may also use a bulk laxative.   Avoid straining to have bowel movements.   Keep anal area dry and clean.   Do not use a donut shaped pillow or sit on the toilet for long periods. This increases blood pooling and pain.   Move your bowels when your body has the urge; this will require less straining and will decrease pain and pressure.

## 2015-10-04 ENCOUNTER — Encounter: Payer: Self-pay | Admitting: Cardiology

## 2015-10-04 ENCOUNTER — Encounter: Payer: Self-pay | Admitting: *Deleted

## 2015-10-04 ENCOUNTER — Ambulatory Visit (INDEPENDENT_AMBULATORY_CARE_PROVIDER_SITE_OTHER): Payer: Commercial Managed Care - HMO | Admitting: Cardiology

## 2015-10-04 VITALS — BP 112/66 | HR 62 | Ht 68.0 in | Wt 157.6 lb

## 2015-10-04 DIAGNOSIS — I2510 Atherosclerotic heart disease of native coronary artery without angina pectoris: Secondary | ICD-10-CM

## 2015-10-04 DIAGNOSIS — I252 Old myocardial infarction: Secondary | ICD-10-CM | POA: Diagnosis not present

## 2015-10-04 DIAGNOSIS — L02436 Carbuncle of left lower limb: Secondary | ICD-10-CM | POA: Diagnosis not present

## 2015-10-04 DIAGNOSIS — E785 Hyperlipidemia, unspecified: Secondary | ICD-10-CM

## 2015-10-04 DIAGNOSIS — I10 Essential (primary) hypertension: Secondary | ICD-10-CM

## 2015-10-04 DIAGNOSIS — I5022 Chronic systolic (congestive) heart failure: Secondary | ICD-10-CM

## 2015-10-04 DIAGNOSIS — I723 Aneurysm of iliac artery: Secondary | ICD-10-CM

## 2015-10-04 DIAGNOSIS — Z01810 Encounter for preprocedural cardiovascular examination: Secondary | ICD-10-CM

## 2015-10-04 DIAGNOSIS — Z720 Tobacco use: Secondary | ICD-10-CM

## 2015-10-04 MED ORDER — AMLODIPINE BESYLATE 5 MG PO TABS: 5.0000 mg | ORAL_TABLET | Freq: Every day | ORAL | Status: DC

## 2015-10-04 NOTE — Patient Instructions (Signed)
Medication Instructions:  Please decrease your Amlodipine to 5 mg a day. Continue all other medications as listed.  Follow-Up: Follow up in 1 year with Dr. Marlou Porch.  You will receive a letter in the mail 2 months before you are due.  Please call us when you receive this letter to schedule your follow up appointment.  If you need a refill on your cardiac medications before your next appointment, please call your pharmacy.  Thank you for choosing [facility]!!

## 2015-10-04 NOTE — Progress Notes (Signed)
Cardiology Office Note   Date:  10/04/2015   ID:  Ruben Young, DOB 11/28/1950, MRN [id number]  PCP:  Maggie Font, MD  Cardiologist:  Dr. Candee Furbish     History of Present Illness: Ruben Young is a 65 y.o. male with a hx of CAD, s/p PCI to RCA in the past, anterior STEMI in 08/2012 tx with BMS to LAD, ischemic CM,  chronic systolic heart failure ,HTN, HL.   He is still smoking although he is smoking less.   Feels some mild SOB upon extreme exertion. Walks 40 min a day, uphill at times, mild SOB. No CP.     2.63 cm left common iliac artery aneurysm, Dr. Scot Dock, normally consider elective repair with reaches 3.5 cm.  Has a soft tissue infection which is going to require incision and drainage under general anesthesia by Dr. Lucia Gaskins.  Studies: - LHC (08/2012): Ant STEMI >>> mid LAD occluded, mid RCA stent ok. PCI: BMS to mid LAD.  - Echo (08/2012): Mild LVH, EF 40-45%, AS dyskinesis, Gr 1 DD.   Recent Labs: 11/08/2014: ALT 12 11/09/2014: BUN 13; Creatinine, Ser 1.26; Hemoglobin 11.9*; Potassium 3.4*; Sodium 138    Recent Radiology: No results found.    Wt Readings from Last 3 Encounters:  10/04/15 157 lb 9.6 oz (71.487 kg)  09/16/15 153 lb 9.6 oz (69.673 kg)  05/26/15 162 lb 4.8 oz (73.619 kg)     Past Medical History  Diagnosis Date  . MI (myocardial infarction) ([place])   . DDD (degenerative disc disease), lumbar   . Hypertension   . Hyperlipidemia   . Old myocardial infarction   . Tobacco use disorder     Current Outpatient Prescriptions  Medication Sig Dispense Refill  . amLODipine (NORVASC) 10 MG tablet Take 1 tablet (10 mg total) by mouth daily. 30 tablet 11  . aspirin 81 MG tablet Take 1 tablet (81 mg total) by mouth daily.    Marland Kitchen atorvastatin (LIPITOR) 40 MG tablet Take 40 mg by mouth daily.    . carvedilol (COREG) 6.25 MG tablet Take 1 tablet (6.25 mg total) by mouth 2 (two) times daily. 180 tablet 0  . Multiple Vitamin (MULTIVITAMIN WITH  MINERALS) TABS tablet Take 1 tablet by mouth daily.    . nitroGLYCERIN (NITROSTAT) 0.4 MG SL tablet Place 0.4 mg under the tongue every 5 (five) minutes as needed. For chest pains    . oxymetazoline (AFRIN) 0.05 % nasal spray Place 1 spray into both nostrils 2 (two) times daily as needed for congestion.     No current facility-administered medications for this visit.     Allergies:   Review of patient's allergies indicates no known allergies.   Social History:  The patient  reports that he has been smoking Cigarettes.  He has a 4.4 pack-year smoking history. He has never used smokeless tobacco. He reports that he does not drink alcohol or use illicit drugs.   Family History:  The patient's family history includes Cancer in his mother; Heart attack in his mother. There is no history of Colon cancer.    ROS:  Please see the history of present illness.  In eyes any bleeding, syncope, orthopnea, PND. Positive for some shortness of breath with activity, back pain, noticed what in stool occasionally.     All other systems reviewed and negative.    PHYSICAL EXAM: VS:  BP 112/66 mmHg  Pulse 62  Ht 5\' 8"  (1.727 m)  Wt 157 lb  9.6 oz (71.487 kg)  BMI 23.97 kg/m2  SpO2 95% Well nourished, well developed, in no acute distress HEENT: normal Neck:  no JVD Cardiac:  normal S1, S2;  RRR;  no murmur   Lungs:   clear to auscultation bilaterally, no wheezing, rhonchi or rales Abd: soft, nontender, no hepatomegaly Ext:  no edema Skin: warm and dry Neuro:  CNs 2-12 intact, no focal abnormalities noted    Echo  09/25/12-EF 40-45%  EKG : Today 04/14/15-sinus bradycardia rate 57, NSSTW changes otherwise normal.  ASSESSMENT AND PLAN:   Hypertension:   No longer on losartan. I will decrease his amlodipine to 5 mg. Continue with carvedilol.     Coronary Artery Disease:  No angina. Able to complete greater than 4 METS of activity without any difficulty.    -  Continue ASA , decreased 81 mg, statin, beta  blocker. Ischemic Cardiomyopathy /chronic systolic heart failure  -class 1-2:  EF 40-45% in 2014.  He does not appear to be volume overloaded.  I think some of his shortness of breath with activity could also be related to years of smoking.    -  Had stopped ARB quite some time ago,continuing beta blocker.   Appears volume neutral  Hyperlipidemia:  Continue statin.   11/08/2014: ALT 12  Excellent  Tobacco Abuse:  He knows he needs to quit.  He states that he has cut back significantly.  Iliac artery aneurysm -right common iliac. Followed by vascular , Dr. Scot Dock. 2.3 cm.  Preoperative risk assessment cardiovascular -he is able to complete greater than 4 METS of activity without any difficulty. He may proceed with surgery with low overall cardiovascular risk. No active anginal symptoms. His ejection fraction on last check was 45%. He appears well compensated. Continue with beta blocker. If necessary, hold aspirin for 7 days prior to surgery. If they are able to perform surgery with aspirin on board this would twice a  Ideal.   Disposition:   FU in 12 months    Signed, Candee Furbish, MD   10/04/2015 2:43 PM    [facility] [address], [place], [place]  [address] Phone: [phone]; Fax: [phone]

## 2015-10-06 ENCOUNTER — Encounter (HOSPITAL_COMMUNITY): Payer: Self-pay | Admitting: Gastroenterology

## 2015-10-07 DIAGNOSIS — L0231 Cutaneous abscess of buttock: Secondary | ICD-10-CM | POA: Diagnosis not present

## 2015-10-12 ENCOUNTER — Ambulatory Visit: Payer: Commercial Managed Care - HMO | Admitting: Cardiology

## 2015-10-13 ENCOUNTER — Other Ambulatory Visit: Payer: Self-pay | Admitting: Surgery

## 2015-10-13 DIAGNOSIS — L0231 Cutaneous abscess of buttock: Secondary | ICD-10-CM | POA: Diagnosis not present

## 2015-10-13 DIAGNOSIS — L02212 Cutaneous abscess of back [any part, except buttock]: Secondary | ICD-10-CM

## 2015-10-13 DIAGNOSIS — L0291 Cutaneous abscess, unspecified: Secondary | ICD-10-CM

## 2015-10-20 ENCOUNTER — Ambulatory Visit
Admission: RE | Admit: 2015-10-20 | Discharge: 2015-10-20 | Disposition: A | Discharge status: Home / Self Care | Payer: Commercial Managed Care - HMO | Source: Ambulatory Visit | Attending: Surgery | Admitting: Surgery

## 2015-10-20 DIAGNOSIS — L0231 Cutaneous abscess of buttock: Secondary | ICD-10-CM | POA: Diagnosis not present

## 2015-10-20 MED ORDER — GADOBENATE DIMEGLUMINE 529 MG/ML IV SOLN
14.0000 mL | Freq: Once | INTRAVENOUS | Status: AC | PRN
  Administered 2015-10-20: 14 mL via INTRAVENOUS

## 2015-10-28 ENCOUNTER — Other Ambulatory Visit: Payer: Self-pay | Admitting: Cardiology

## 2015-11-05 DIAGNOSIS — Z006 Encounter for examination for normal comparison and control in clinical research program: Secondary | ICD-10-CM | POA: Diagnosis not present

## 2015-11-05 DIAGNOSIS — L0231 Cutaneous abscess of buttock: Secondary | ICD-10-CM | POA: Diagnosis not present

## 2015-11-05 DIAGNOSIS — I2510 Atherosclerotic heart disease of native coronary artery without angina pectoris: Secondary | ICD-10-CM | POA: Diagnosis not present

## 2015-11-11 ENCOUNTER — Telehealth: Payer: Self-pay | Admitting: Gastroenterology

## 2015-11-11 NOTE — Telephone Encounter (Signed)
Please call pt. HE had THREE simple adenomas AND THREE HYPERPLASTIC POLYPS WERE removed. FOLLOW A HIGH FIBER DIET. NEXT TCS IN 3 YEARS.

## 2015-11-11 NOTE — Telephone Encounter (Signed)
Pt is aware of results. 

## 2015-11-11 NOTE — Telephone Encounter (Signed)
Reminder in epic °

## 2015-11-15 DIAGNOSIS — I10 Essential (primary) hypertension: Secondary | ICD-10-CM | POA: Diagnosis not present

## 2015-11-15 DIAGNOSIS — Z6822 Body mass index (BMI) 22.0-22.9, adult: Secondary | ICD-10-CM | POA: Diagnosis not present

## 2015-11-15 DIAGNOSIS — L02436 Carbuncle of left lower limb: Secondary | ICD-10-CM | POA: Diagnosis not present

## 2015-11-30 ENCOUNTER — Other Ambulatory Visit: Payer: Self-pay | Admitting: Surgery

## 2015-12-10 ENCOUNTER — Telehealth: Payer: Self-pay | Admitting: Cardiology

## 2015-12-10 NOTE — Telephone Encounter (Signed)
New message      Pt want to quit smoking.  Is it ok to chew the gum, uses the patches and other nicotine stuff?

## 2015-12-10 NOTE — Telephone Encounter (Signed)
Advised pt Dr Marlou Porch does from time to time recommend pt's to use nicotine replacement.  Advised to refer to the instructions on the package that he chooses to use.  Also advised if he notices increased HR or dizziness he should decrease or discontinue use.  He states understanding.

## 2016-01-07 ENCOUNTER — Emergency Department (HOSPITAL_COMMUNITY)
Admission: EM | Admit: 2016-01-07 | Discharge: 2016-01-07 | Disposition: A | Discharge status: Home / Self Care | Payer: Commercial Managed Care - HMO | Attending: Emergency Medicine | Admitting: Emergency Medicine

## 2016-01-07 ENCOUNTER — Encounter (HOSPITAL_COMMUNITY): Payer: Self-pay | Admitting: Cardiology

## 2016-01-07 DIAGNOSIS — Z7982 Long term (current) use of aspirin: Secondary | ICD-10-CM | POA: Diagnosis not present

## 2016-01-07 DIAGNOSIS — Z79899 Other long term (current) drug therapy: Secondary | ICD-10-CM | POA: Diagnosis not present

## 2016-01-07 DIAGNOSIS — E785 Hyperlipidemia, unspecified: Secondary | ICD-10-CM | POA: Diagnosis not present

## 2016-01-07 DIAGNOSIS — F17210 Nicotine dependence, cigarettes, uncomplicated: Secondary | ICD-10-CM | POA: Insufficient documentation

## 2016-01-07 DIAGNOSIS — I252 Old myocardial infarction: Secondary | ICD-10-CM | POA: Diagnosis not present

## 2016-01-07 DIAGNOSIS — R0981 Nasal congestion: Secondary | ICD-10-CM | POA: Insufficient documentation

## 2016-01-07 DIAGNOSIS — J029 Acute pharyngitis, unspecified: Secondary | ICD-10-CM

## 2016-01-07 DIAGNOSIS — I10 Essential (primary) hypertension: Secondary | ICD-10-CM | POA: Insufficient documentation

## 2016-01-07 MED ORDER — AMOXICILLIN 500 MG PO CAPS: 500.0000 mg | ORAL_CAPSULE | Freq: Three times a day (TID) | ORAL | Status: DC

## 2016-01-07 NOTE — Discharge Instructions (Signed)

## 2016-01-07 NOTE — ED Provider Notes (Signed)
CSN: [id number]     Arrival date & time 01/07/16  1132 History   First MD Initiated Contact with Patient 01/07/16 1148     Chief Complaint  Patient presents with  . Sore Throat     (Consider location/radiation/quality/duration/timing/severity/associated sxs/prior Treatment) Patient is a 65 y.o. male presenting with pharyngitis. The history is provided by the patient. No language interpreter was used.  Sore Throat This is a new problem. The current episode started today. The problem occurs constantly. The problem has been unchanged. Associated symptoms include a sore throat. Nothing aggravates the symptoms. He has tried nothing for the symptoms. The treatment provided moderate relief.  Pt complains of a sore throat and congestion  Past Medical History  Diagnosis Date  . MI (myocardial infarction) ([place])   . DDD (degenerative disc disease), lumbar   . Hypertension   . Hyperlipidemia   . Old myocardial infarction   . Tobacco use disorder    Past Surgical History  Procedure Laterality Date  . Tonsillectomy    . Coronary angioplasty with stent placement    . Left heart catheterization with coronary angiogram N/A 09/24/2012    Procedure: LEFT HEART CATHETERIZATION WITH CORONARY ANGIOGRAM;  Surgeon: Jettie Booze, MD;  Location: [facility] CATH LAB;  Service: Cardiovascular;  Laterality: N/A;  . Colonoscopy N/A 10/01/2015    Procedure: COLONOSCOPY;  Surgeon: Danie Binder, MD;  Location: [facility] ENDO SUITE;  Service: Endoscopy;  Laterality: N/A;  245   Family History  Problem Relation Age of Onset  . Cancer Mother   . Heart attack Mother   . Colon cancer Neg Hx    Social History  Substance Use Topics  . Smoking status: Current Some Day Smoker -- 0.10 packs/day for 44 years    Types: Cigarettes    Last Attempt to Quit: 10/25/2014  . Smokeless tobacco: Never Used     Comment: only smokes 2-3 per day sometimes none  . Alcohol Use: No    Review of Systems  HENT: Positive for sore throat.    All other systems reviewed and are negative.     Allergies  Review of patient's allergies indicates no known allergies.  Home Medications   Prior to Admission medications   Medication Sig Start Date End Date Taking? Authorizing Provider  amLODipine (NORVASC) 5 MG tablet Take 1 tablet (5 mg total) by mouth daily. 10/04/15   Jerline Pain, MD  amoxicillin (AMOXIL) 500 MG capsule Take 1 capsule (500 mg total) by mouth 3 (three) times daily. 01/07/16   Fransico Meadow, PA-C  aspirin 81 MG tablet Take 1 tablet (81 mg total) by mouth daily. 04/14/15   Jerline Pain, MD  atorvastatin (LIPITOR) 40 MG tablet Take 40 mg by mouth daily. 09/03/15   Historical Provider, MD  carvedilol (COREG) 6.25 MG tablet Take 1 tablet (6.25 mg total) by mouth 2 (two) times daily. 10/28/15   Jerline Pain, MD  Multiple Vitamin (MULTIVITAMIN WITH MINERALS) TABS tablet Take 1 tablet by mouth daily.    Historical Provider, MD  nitroGLYCERIN (NITROSTAT) 0.4 MG SL tablet Place 0.4 mg under the tongue every 5 (five) minutes as needed. For chest pains    Historical Provider, MD  oxymetazoline (AFRIN) 0.05 % nasal spray Place 1 spray into both nostrils 2 (two) times daily as needed for congestion.    Historical Provider, MD   BP 134/65 mmHg  Pulse 70  Temp(Src) 98.1 F (36.7 C) (Oral)  Resp 16  Ht 5'  8" (1.727 m)  Wt 68.947 kg  BMI 23.12 kg/m2  SpO2 98% Physical Exam  Constitutional: He appears well-developed and well-nourished.  HENT:  Head: Normocephalic and atraumatic.  Right Ear: External ear normal.  Eyes: Conjunctivae and EOM are normal. Pupils are equal, round, and reactive to light.  Neck: Normal range of motion.  Cardiovascular: Normal rate.   Pulmonary/Chest: Effort normal.  Abdominal: Soft.  Musculoskeletal: Normal range of motion.  Skin: Skin is warm.  Nursing note and vitals reviewed.   ED Course  Procedures (including critical care time) Labs Review Labs Reviewed - No data to display  Imaging  Review No results found. I have personally reviewed and evaluated these images and lab results as part of my medical decision-making.   EKG Interpretation None      MDM   Final diagnoses:  Pharyngitis    Meds ordered this encounter  Medications  . amoxicillin (AMOXIL) 500 MG capsule    Sig: Take 1 capsule (500 mg total) by mouth 3 (three) times daily.    Dispense:  30 capsule    Refill:  0    Order Specific Question:  Supervising Provider    Answer:  Noemi Chapel [[id number]]  An After Visit Summary was printed and given to the patient.    Hollace Kinnier [place], PA-C 01/07/16 1553  Merrily Pew, MD 01/08/16 [phone]

## 2016-01-07 NOTE — ED Notes (Signed)
sorethroat and congestion times one week

## 2016-01-21 NOTE — Patient Instructions (Addendum)
COBI TAFEL  01/21/2016   Your procedure is scheduled on: 01-28-16  Report to [place]  Entrance take [facility]  elevators to 3rd floor to  [place] at 530 AM.  Call this number if you have problems the morning of surgery [phone]   Remember: ONLY 1 PERSON MAY GO WITH YOU TO SHORT STAY TO GET  READY MORNING OF [place].  Do not eat food or drink liquids :After Midnight.     Take these medicines the morning of surgery with A SIP OF WATER: AMLODIPIEN (NORVASC), , CARVEDILOL (COREG)                               You may not have any metal on your body including hair pins and              piercings  Do not wear jewelry, , lotions, powders or perfumes, deodorant                      Men may shave face and neck.   Do not bring valuables to the hospital. [place].  Contacts, dentures or bridgework may not be worn into surgery.  Leave suitcase in the car. After surgery it may be brought to your room.      Special Instructions: N/A              Please read over the following fact sheets you were given: _____________________________________________________________________             [facility] - Preparing for Surgery Before surgery, you can play an important role.  Because skin is not sterile, your skin needs to be as free of germs as possible.  You can reduce the number of germs on your skin by washing with CHG (chlorahexidine gluconate) soap before surgery.  CHG is an antiseptic cleaner which kills germs and bonds with the skin to continue killing germs even after washing. Please DO NOT use if you have an allergy to CHG or antibacterial soaps.  If your skin becomes reddened/irritated stop using the CHG and inform your nurse when you arrive at Short Stay. Do not shave (including legs and underarms) for at least 48 hours prior to the first CHG shower.  You may shave your face/neck. Please  follow these instructions carefully:  1.  Shower with CHG Soap the night before surgery and the  morning of Surgery.  2.  If you choose to wash your hair, wash your hair first as usual with your  normal  shampoo.  3.  After you shampoo, rinse your hair and body thoroughly to remove the  shampoo.                           4.  Use CHG as you would any other liquid soap.  You can apply chg directly  to the skin and wash                       Gently with a scrungie or clean washcloth.  5.  Apply the CHG Soap to your body ONLY FROM THE NECK DOWN.   Do not  use on face/ open                           Wound or open sores. Avoid contact with eyes, ears mouth and genitals (private parts).                       Wash face,  Genitals (private parts) with your normal soap.             6.  Wash thoroughly, paying special attention to the area where your surgery  will be performed.  7.  Thoroughly rinse your body with warm water from the neck down.  8.  DO NOT shower/wash with your normal soap after using and rinsing off  the CHG Soap.                9.  Pat yourself dry with a clean towel.            10.  Wear clean pajamas.            11.  Place clean sheets on your bed the night of your first shower and do not  sleep with pets. Day of Surgery : Do not apply any lotions/deodorants the morning of surgery.  Please wear clean clothes to the hospital/surgery center.  FAILURE TO FOLLOW THESE INSTRUCTIONS MAY RESULT IN THE CANCELLATION OF YOUR SURGERY PATIENT SIGNATURE_________________________________  NURSE SIGNATURE__________________________________  ________________________________________________________________________

## 2016-01-25 ENCOUNTER — Encounter (HOSPITAL_COMMUNITY)
Admission: RE | Admit: 2016-01-25 | Discharge: 2016-01-25 | Disposition: A | Discharge status: Home / Self Care | Payer: Commercial Managed Care - HMO | Source: Ambulatory Visit | Attending: Surgery | Admitting: Surgery

## 2016-01-25 ENCOUNTER — Encounter (HOSPITAL_COMMUNITY): Payer: Self-pay

## 2016-01-25 DIAGNOSIS — I252 Old myocardial infarction: Secondary | ICD-10-CM | POA: Diagnosis not present

## 2016-01-25 DIAGNOSIS — I255 Ischemic cardiomyopathy: Secondary | ICD-10-CM | POA: Diagnosis present

## 2016-01-25 DIAGNOSIS — I110 Hypertensive heart disease with heart failure: Secondary | ICD-10-CM | POA: Diagnosis present

## 2016-01-25 DIAGNOSIS — L0231 Cutaneous abscess of buttock: Secondary | ICD-10-CM | POA: Diagnosis present

## 2016-01-25 DIAGNOSIS — L988 Other specified disorders of the skin and subcutaneous tissue: Secondary | ICD-10-CM | POA: Diagnosis not present

## 2016-01-25 DIAGNOSIS — I723 Aneurysm of iliac artery: Secondary | ICD-10-CM | POA: Diagnosis present

## 2016-01-25 DIAGNOSIS — K6289 Other specified diseases of anus and rectum: Secondary | ICD-10-CM | POA: Diagnosis not present

## 2016-01-25 DIAGNOSIS — I2510 Atherosclerotic heart disease of native coronary artery without angina pectoris: Secondary | ICD-10-CM | POA: Diagnosis present

## 2016-01-25 DIAGNOSIS — L0889 Other specified local infections of the skin and subcutaneous tissue: Secondary | ICD-10-CM | POA: Diagnosis not present

## 2016-01-25 DIAGNOSIS — Z79899 Other long term (current) drug therapy: Secondary | ICD-10-CM | POA: Diagnosis not present

## 2016-01-25 DIAGNOSIS — Z7982 Long term (current) use of aspirin: Secondary | ICD-10-CM | POA: Diagnosis not present

## 2016-01-25 DIAGNOSIS — F17200 Nicotine dependence, unspecified, uncomplicated: Secondary | ICD-10-CM | POA: Diagnosis present

## 2016-01-25 DIAGNOSIS — I10 Essential (primary) hypertension: Secondary | ICD-10-CM | POA: Diagnosis not present

## 2016-01-25 DIAGNOSIS — K603 Anal fistula: Secondary | ICD-10-CM | POA: Diagnosis present

## 2016-01-25 DIAGNOSIS — I5022 Chronic systolic (congestive) heart failure: Secondary | ICD-10-CM | POA: Diagnosis present

## 2016-01-25 DIAGNOSIS — E785 Hyperlipidemia, unspecified: Secondary | ICD-10-CM | POA: Diagnosis present

## 2016-01-25 DIAGNOSIS — Z955 Presence of coronary angioplasty implant and graft: Secondary | ICD-10-CM | POA: Diagnosis not present

## 2016-01-25 LAB — COMPREHENSIVE METABOLIC PANEL
ALT: 18 U/L (ref 17–63)
ANION GAP: 7 (ref 5–15)
AST: 18 U/L (ref 15–41)
Albumin: 3.5 g/dL (ref 3.5–5.0)
Alkaline Phosphatase: 77 U/L (ref 38–126)
BUN: 13 mg/dL (ref 6–20)
CHLORIDE: 106 mmol/L (ref 101–111)
CO2: 28 mmol/L (ref 22–32)
Calcium: 9.3 mg/dL (ref 8.9–10.3)
Creatinine, Ser: 1.36 mg/dL — ABNORMAL HIGH (ref 0.61–1.24)
GFR calc Af Amer: >60 mL/min (ref >60)
GFR, EST NON AFRICAN AMERICAN: 53 mL/min — AB (ref >60)
Glucose, Bld: 98 mg/dL (ref 65–99)
POTASSIUM: 4.1 mmol/L (ref 3.5–5.1)
Sodium: 141 mmol/L (ref 135–145)
Total Bilirubin: 0.7 mg/dL (ref 0.3–1.2)
Total Protein: 7.6 g/dL (ref 6.5–8.1)

## 2016-01-25 LAB — CBC WITH DIFFERENTIAL/PLATELET
BASOS ABS: 0 10*3/uL (ref 0.0–0.1)
BASOS PCT: 0 %
Eosinophils Absolute: 0.3 10*3/uL (ref 0.0–0.7)
Eosinophils Relative: 2 %
HEMATOCRIT: 41.5 % (ref 39.0–52.0)
HEMOGLOBIN: 13.9 g/dL (ref 13.0–17.0)
Lymphocytes Relative: 29 %
Lymphs Abs: 3.7 10*3/uL (ref 0.7–4.0)
MCH: 28.9 pg (ref 26.0–34.0)
MCHC: 33.5 g/dL (ref 30.0–36.0)
MCV: 86.3 fL (ref 78.0–100.0)
Monocytes Absolute: 1.2 10*3/uL — ABNORMAL HIGH (ref 0.1–1.0)
Monocytes Relative: 9 %
NEUTROS ABS: 7.4 10*3/uL (ref 1.7–7.7)
NEUTROS PCT: 60 %
Platelets: 264 10*3/uL (ref 150–400)
RBC: 4.81 MIL/uL (ref 4.22–5.81)
RDW: 15.2 % (ref 11.5–15.5)
WBC: 12.6 10*3/uL — ABNORMAL HIGH (ref 4.0–10.5)

## 2016-01-25 NOTE — Progress Notes (Addendum)
CBC done 01/25/16 faxed via EPIC to DR Alphonsa Overall.  CMP done 01/25/16 faxed via EPIC to DR Alphonsa Overall.

## 2016-01-25 NOTE — Progress Notes (Signed)
10/04/15- LOV- Cardiology- EPIC  04/14/15- EKG- EPIC  05/26/15- LOV- Vasc- EPIC

## 2016-01-27 NOTE — H&P (Signed)
Ruben Young  Location: [facility] Patient #: [id number] DOB: 19-Mar-1951 Single / Language: Cleophus Molt / Race: Black or African American Male  History of Present Illness   The patient is a 65 year old male who presents with a subcutaneous abscess.   His PCP is Dr. Iona Beard.  He is here with his neice, Ledell Noss.   He had an MRI of his buttocks on 10/20/2015. It appears on the MRI on that this does connect to the posterior rectum at 6 o'clock. I reviewed this with Dr. Collins Scotland. I gave them a picture of when I first saw him. I explained the procedure: the benefits, the risks (bleeding, infection, nerve injury, and recurrence of the fistula), the possibllity of recurrence, and the hospitalization (probaly 1 to 3 days.). I have discussed with him before that smoking impares wound healing and leads to long term diseases. He wants to go ahead and schedule the surgery. We will aim for June, this will give his 6 weeks to quit smoking.  History of buttocks abscess. Ruben Young is a 65 year old gentleman who is referred to [place] by Dr. Berdine Addison with the [facility] for a long standing history of recurrent buttock abscesses. The patient states that this is been going on for "years". He has no current complaints of pain, swelling, discharge or fever. He says that he was referred to a surgeon many years ago to have it "cut out" but he was afraid of this and opted not to pursue surgical treatment. He says he has been given antibiotics for this but they keep coming back. He has no difficulty moving his bowels. He has had some of these abscesses incised and drained in the emergency department. But this process stretches back 15 or 20 years. I saw him with Jonni Sanger on his visit 09/02/2015. His left buttocks is about the same as when I last saw him. He did see Dr. Elby Showers for this in the past, but it has been over 5 years.   We had 4 things to  do: 1. Colonoscopy - Dr. Eden Lathe did a colonoscopy on 10/01/2015. She biopsied some benign polyps, saw some diverticular disease, but saw no evidence of a fistula 2. See Dr. Marlou Porch - he saw 10/04/2015 His symptoms are stable and Dr. Marlou Porch said we could stop the aspirin 7 days before surgery 3. Quit smoking 4. Get MRI of buttocks to try to define the tracks See above  Past Medical History 1. He has a history of smoking, but he says he has cut down on this significantly. 2. Hx of CAD, s/p PCI to RCA in the past, Anterior STEMI in 09/24/2012 tx with BMS to LAD - Casandra Doffing Followed by Dr. Marlou Porch 3. Ischemic CM, chronic systolic heart failure 4. HTN 5. Hyperlipidemia 6. 2.63 left common iliac aneurysm - seen by Dr. Scot Dock - not to worry until it reaches 3.5 cm 7. Smokes He is going to try to quit. It would be good if we could schedule this beyond 6 weeks from him quitting smoking.  Social History: Unmarried - lives by self Has 4 children: 2 boys live in [place], 1 lives in [place] and 1 lives in [place] But he cannot count on his children He depends on his sister, Janeece Agee He is here with his neice, Ledell Noss. This is who he will stay with post op and who will help manage his care.  Other Problems Shann Medal, MD; 11/05/2015 1:56 PM) Arthritis Back Pain  Congestive Heart Failure Hemorrhoids High blood pressure Hypercholesterolemia Myocardial infarction  Past Surgical History Shann Medal, MD; 11/05/2015 1:56 PM) Tonsillectomy  Allergies (Sonya Bynum, CMA; 11/05/2015 3:10 PM) No Known Drug Allergies02/09/2015  Medication History (Sonya Bynum, CMA; 11/05/2015 3:10 PM) Nitrostat (0.4MG  Tab Sublingual, Sublingual) Active. Multi Vitamin (Oral) Active. Aspirin (81MG  Tablet Chewable, Oral) Active. Afrin 12 Hour (0.05% Solution, Nasal) Active. Atorvastatin Calcium (40MG  Tablet, Oral)  Active. AmLODIPine Besylate (10MG  Tablet, Oral) Active. Carvedilol (6.25MG  Tablet, Oral) Active. Medications Reconciled  Vitals (Sonya Bynum CMA; 11/05/2015 3:10 PM) 11/05/2015 3:09 PM Weight: 155 lb Height: 68in Body Surface Area: 1.83 m Body Mass Index: 23.57 kg/m  Temp.: 46F(Temporal)  Pulse: 79 (Regular)  BP: 128/82 (Sitting, Left Arm, Standard)   Physical Exam  General: WN older AA M alert. HEENT: Normal. Pupils equal.  Neck: Supple. No mass. No thyroid mass.  Lymph Nodes: No supraclavicular or cervical nodes.  Lungs: Clear to auscultation and symmetric breath sounds. Heart: RRR. No murmur or rub.  Abdomen: Soft. No mass. No tenderness. Has umbilical hernia. Normal bowel sounds. No abdominal scars. Buttocks: Draining tracks in his left buttocks. There is a photo in [employer]. He may have a small area on the right buttocks.  Extremities: Good strength and ROM in upper and lower extremities.  Neurologic: Grossly intact to motor and sensory function. Psychiatric: Has normal mood and affect. Behavior is normal.  Assessment & Plan  1.  ABSCESS OF MULTIPLE SITES OF BUTTOCK (L02.31)  Plan:  1) To quit smoking for 6 weeks  2) Stop aspirin for 5 - 7 days before surgery  3) Schedule surgery to excise fistula track in June  2.  CORONARY ARTERY DISEASE, OCCLUSIVE (I25.10)   s/p PCI to RCA in the past,  Anterior STEMI in 09/24/2012 tx with BMS to LAD - Casandra Doffing  Impression: Note from Dr. Marlou Porch - 10/04/2015  He thought he was low risk and could hold aspirin 7 days prior to surgery.  3. Ischemic CM, chronic systolic heart failure 4. HTN 5. Hyperlipidemia 6. 2.63 left common iliac aneurysm - seen by Dr. Scot Dock - not to worry until it reaches 3.5 cm   Alphonsa Overall, MD, [facility] Surgery Pager: [phone] Office phone:  [phone]

## 2016-01-28 ENCOUNTER — Inpatient Hospital Stay (HOSPITAL_COMMUNITY): Payer: Commercial Managed Care - HMO | Admitting: Anesthesiology

## 2016-01-28 ENCOUNTER — Encounter (HOSPITAL_COMMUNITY): Payer: Self-pay | Admitting: *Deleted

## 2016-01-28 ENCOUNTER — Inpatient Hospital Stay (HOSPITAL_COMMUNITY)
Admission: RE | Admit: 2016-01-28 | Discharge: 2016-01-29 | DRG: 333 | Disposition: A | Discharge status: Home / Self Care | Payer: Commercial Managed Care - HMO | Source: Ambulatory Visit | Attending: Surgery | Admitting: Surgery

## 2016-01-28 ENCOUNTER — Encounter (HOSPITAL_COMMUNITY)
Admission: RE | Disposition: A | Discharge status: Home / Self Care | Payer: Self-pay | Source: Ambulatory Visit | Attending: Surgery

## 2016-01-28 DIAGNOSIS — I723 Aneurysm of iliac artery: Secondary | ICD-10-CM | POA: Diagnosis present

## 2016-01-28 DIAGNOSIS — Z79899 Other long term (current) drug therapy: Secondary | ICD-10-CM

## 2016-01-28 DIAGNOSIS — K603 Anal fistula, unspecified: Secondary | ICD-10-CM | POA: Diagnosis present

## 2016-01-28 DIAGNOSIS — I5022 Chronic systolic (congestive) heart failure: Secondary | ICD-10-CM | POA: Diagnosis present

## 2016-01-28 DIAGNOSIS — F17200 Nicotine dependence, unspecified, uncomplicated: Secondary | ICD-10-CM | POA: Diagnosis present

## 2016-01-28 DIAGNOSIS — Z7982 Long term (current) use of aspirin: Secondary | ICD-10-CM

## 2016-01-28 DIAGNOSIS — I2510 Atherosclerotic heart disease of native coronary artery without angina pectoris: Secondary | ICD-10-CM | POA: Diagnosis present

## 2016-01-28 DIAGNOSIS — I110 Hypertensive heart disease with heart failure: Secondary | ICD-10-CM | POA: Diagnosis present

## 2016-01-28 DIAGNOSIS — E785 Hyperlipidemia, unspecified: Secondary | ICD-10-CM | POA: Diagnosis present

## 2016-01-28 DIAGNOSIS — L988 Other specified disorders of the skin and subcutaneous tissue: Secondary | ICD-10-CM | POA: Diagnosis not present

## 2016-01-28 DIAGNOSIS — I252 Old myocardial infarction: Secondary | ICD-10-CM

## 2016-01-28 DIAGNOSIS — L0231 Cutaneous abscess of buttock: Secondary | ICD-10-CM | POA: Diagnosis present

## 2016-01-28 DIAGNOSIS — Z955 Presence of coronary angioplasty implant and graft: Secondary | ICD-10-CM | POA: Diagnosis not present

## 2016-01-28 DIAGNOSIS — I255 Ischemic cardiomyopathy: Secondary | ICD-10-CM | POA: Diagnosis present

## 2016-01-28 HISTORY — PX: EXCISION MASS LOWER EXTREMETIES: SHX6705

## 2016-01-28 SURGERY — EXCISION MASS LOWER EXTREMITIES
Anesthesia: General | Site: Buttocks | Laterality: Left

## 2016-01-28 MED ORDER — IBUPROFEN 400 MG PO TABS: 600.0000 mg | ORAL_TABLET | Freq: Four times a day (QID) | ORAL | Status: DC | PRN

## 2016-01-28 MED ORDER — CARVEDILOL 6.25 MG PO TABS
6.2500 mg | ORAL_TABLET | Freq: Two times a day (BID) | ORAL | Status: DC
  Administered 2016-01-28 – 2016-01-29 (×2): 6.25 mg via ORAL
  Filled 2016-01-28: qty 1

## 2016-01-28 MED ORDER — ONDANSETRON HCL 4 MG/2ML IJ SOLN
INTRAMUSCULAR | Status: AC
  Filled 2016-01-28: qty 2

## 2016-01-28 MED ORDER — HYDROMORPHONE HCL 1 MG/ML IJ SOLN
0.2500 mg | INTRAMUSCULAR | Status: DC | PRN
  Administered 2016-01-28 (×2): 0.5 mg via INTRAVENOUS

## 2016-01-28 MED ORDER — BUPIVACAINE-EPINEPHRINE 0.25% -1:200000 IJ SOLN
INTRAMUSCULAR | Status: DC | PRN
  Administered 2016-01-28: 40 mL

## 2016-01-28 MED ORDER — HEPARIN SODIUM (PORCINE) 5000 UNIT/ML IJ SOLN
5000.0000 [IU] | Freq: Three times a day (TID) | INTRAMUSCULAR | Status: DC
  Administered 2016-01-28 – 2016-01-29 (×4): 5000 [IU] via SUBCUTANEOUS
  Filled 2016-01-28 (×3): qty 1

## 2016-01-28 MED ORDER — LIDOCAINE HCL (CARDIAC) 20 MG/ML IV SOLN
INTRAVENOUS | Status: AC
  Filled 2016-01-28: qty 5

## 2016-01-28 MED ORDER — BUPIVACAINE-EPINEPHRINE 0.25% -1:200000 IJ SOLN
INTRAMUSCULAR | Status: AC
  Filled 2016-01-28: qty 1

## 2016-01-28 MED ORDER — FENTANYL CITRATE (PF) 250 MCG/5ML IJ SOLN
INTRAMUSCULAR | Status: AC
  Filled 2016-01-28: qty 5

## 2016-01-28 MED ORDER — MIDAZOLAM HCL 5 MG/5ML IJ SOLN
INTRAMUSCULAR | Status: DC | PRN
  Administered 2016-01-28: 2 mg via INTRAVENOUS

## 2016-01-28 MED ORDER — SODIUM CHLORIDE 0.9 % IJ SOLN
INTRAMUSCULAR | Status: AC
  Filled 2016-01-28: qty 10

## 2016-01-28 MED ORDER — LACTATED RINGERS IV SOLN
INTRAVENOUS | Status: DC | PRN
Start: 2016-01-28 — End: 2016-01-28
  Administered 2016-01-28: 07:00:00 via INTRAVENOUS

## 2016-01-28 MED ORDER — LIDOCAINE HCL (CARDIAC) 20 MG/ML IV SOLN
INTRAVENOUS | Status: DC | PRN
  Administered 2016-01-28: 80 mg via INTRAVENOUS

## 2016-01-28 MED ORDER — ONDANSETRON 4 MG PO TBDP: 4.0000 mg | ORAL_TABLET | Freq: Four times a day (QID) | ORAL | Status: DC | PRN

## 2016-01-28 MED ORDER — FENTANYL CITRATE (PF) 100 MCG/2ML IJ SOLN
INTRAMUSCULAR | Status: DC | PRN
  Administered 2016-01-28: 25 ug via INTRAVENOUS
  Administered 2016-01-28 (×2): 50 ug via INTRAVENOUS

## 2016-01-28 MED ORDER — HYDROMORPHONE HCL 1 MG/ML IJ SOLN
INTRAMUSCULAR | Status: AC
  Filled 2016-01-28: qty 1

## 2016-01-28 MED ORDER — METHYLENE BLUE 0.5 % INJ SOLN
INTRAVENOUS | Status: AC
Start: 2016-01-28 — End: 2016-01-28
  Filled 2016-01-28: qty 10

## 2016-01-28 MED ORDER — METHYLENE BLUE 0.5 % INJ SOLN
INTRAVENOUS | Status: DC | PRN
  Administered 2016-01-28: 2.5 mL via SUBMUCOSAL

## 2016-01-28 MED ORDER — EPHEDRINE SULFATE 50 MG/ML IJ SOLN
INTRAMUSCULAR | Status: DC | PRN
  Administered 2016-01-28 (×2): 10 mg via INTRAVENOUS
  Administered 2016-01-28: 5 mg via INTRAVENOUS

## 2016-01-28 MED ORDER — SUCCINYLCHOLINE CHLORIDE 20 MG/ML IJ SOLN
INTRAMUSCULAR | Status: DC | PRN
  Administered 2016-01-28: 100 mg via INTRAVENOUS

## 2016-01-28 MED ORDER — AMLODIPINE BESYLATE 5 MG PO TABS
5.0000 mg | ORAL_TABLET | Freq: Every day | ORAL | Status: DC
  Administered 2016-01-29: 5 mg via ORAL
  Filled 2016-01-28: qty 1

## 2016-01-28 MED ORDER — CEFAZOLIN SODIUM-DEXTROSE 2-4 GM/100ML-% IV SOLN
INTRAVENOUS | Status: AC
  Filled 2016-01-28: qty 100

## 2016-01-28 MED ORDER — PROPOFOL 10 MG/ML IV BOLUS
INTRAVENOUS | Status: AC
  Filled 2016-01-28: qty 20

## 2016-01-28 MED ORDER — EPHEDRINE SULFATE 50 MG/ML IJ SOLN
INTRAMUSCULAR | Status: AC
Start: 2016-01-28 — End: 2016-01-28
  Filled 2016-01-28: qty 1

## 2016-01-28 MED ORDER — LIDOCAINE HCL 1 % IJ SOLN
INTRAMUSCULAR | Status: AC
  Filled 2016-01-28: qty 20

## 2016-01-28 MED ORDER — CEFAZOLIN SODIUM-DEXTROSE 2-4 GM/100ML-% IV SOLN
2.0000 g | INTRAVENOUS | Status: AC
  Administered 2016-01-28: 2 g via INTRAVENOUS

## 2016-01-28 MED ORDER — LACTATED RINGERS IV SOLN: INTRAVENOUS | Status: DC

## 2016-01-28 MED ORDER — ONDANSETRON HCL 4 MG/2ML IJ SOLN: 4.0000 mg | Freq: Four times a day (QID) | INTRAMUSCULAR | Status: DC | PRN

## 2016-01-28 MED ORDER — ONDANSETRON HCL 4 MG/2ML IJ SOLN
INTRAMUSCULAR | Status: DC | PRN
  Administered 2016-01-28: 4 mg via INTRAVENOUS

## 2016-01-28 MED ORDER — KCL IN DEXTROSE-NACL 20-5-0.45 MEQ/L-%-% IV SOLN
INTRAVENOUS | Status: DC
  Administered 2016-01-28 – 2016-01-29 (×2): via INTRAVENOUS
  Filled 2016-01-28 (×4): qty 1000

## 2016-01-28 MED ORDER — PROPOFOL 10 MG/ML IV BOLUS
INTRAVENOUS | Status: DC | PRN
  Administered 2016-01-28: 120 mg via INTRAVENOUS

## 2016-01-28 MED ORDER — MIDAZOLAM HCL 2 MG/2ML IJ SOLN
INTRAMUSCULAR | Status: AC
  Filled 2016-01-28: qty 2

## 2016-01-28 MED ORDER — HYDROCODONE-ACETAMINOPHEN 5-325 MG PO TABS
1.0000 | ORAL_TABLET | ORAL | Status: DC | PRN
  Administered 2016-01-28 – 2016-01-29 (×3): 1 via ORAL
  Administered 2016-01-29: 2 via ORAL
  Filled 2016-01-28: qty 2
  Filled 2016-01-28 (×3): qty 1

## 2016-01-28 MED ORDER — SODIUM CHLORIDE 0.9 % IR SOLN
Status: DC | PRN
  Administered 2016-01-28: 1000 mL

## 2016-01-28 MED ORDER — MORPHINE SULFATE (PF) 2 MG/ML IV SOLN: 1.0000 mg | INTRAVENOUS | Status: DC | PRN

## 2016-01-28 MED ORDER — NITROGLYCERIN 0.4 MG SL SUBL
0.4000 mg | SUBLINGUAL_TABLET | SUBLINGUAL | Status: DC | PRN
Start: 2016-01-28 — End: 2016-01-29

## 2016-01-28 SURGICAL SUPPLY — 44 items
APL SKNCLS STERI-STRIP NONHPOA (GAUZE/BANDAGES/DRESSINGS)
BENZOIN TINCTURE PRP APPL 2/3 (GAUZE/BANDAGES/DRESSINGS) IMPLANT
BLADE 10 SAFETY STRL DISP (BLADE) ×3 IMPLANT
BLADE HEX COATED 2.75 (ELECTRODE) ×2 IMPLANT
BLADE SURG SZ10 CARB STEEL (BLADE) ×8 IMPLANT
BNDG GAUZE ELAST 4 BULKY (GAUZE/BANDAGES/DRESSINGS) ×1 IMPLANT
COVER SURGICAL LIGHT HANDLE (MISCELLANEOUS) ×2 IMPLANT
DECANTER SPIKE VIAL GLASS SM (MISCELLANEOUS) IMPLANT
DRAPE LAPAROTOMY T 102X78X121 (DRAPES) IMPLANT
DRAPE LAPAROTOMY TRNSV 102X78 (DRAPE) IMPLANT
DRAPE SHEET LG 3/4 BI-LAMINATE (DRAPES) IMPLANT
ELECT PENCIL ROCKER SW 15FT (MISCELLANEOUS) ×2 IMPLANT
ELECT REM PT RETURN 9FT ADLT (ELECTROSURGICAL) ×2
ELECTRODE REM PT RTRN 9FT ADLT (ELECTROSURGICAL) ×1 IMPLANT
EVACUATOR SILICONE 100CC (DRAIN) IMPLANT
GAUZE SPONGE 4X4 12PLY STRL (GAUZE/BANDAGES/DRESSINGS) ×2 IMPLANT
GLOVE BIOGEL M 6.5 STRL (GLOVE) ×1 IMPLANT
GLOVE BIOGEL M STRL SZ7.5 (GLOVE) ×1 IMPLANT
GLOVE BIOGEL PI IND STRL 6 (GLOVE) IMPLANT
GLOVE BIOGEL PI IND STRL 7.0 (GLOVE) ×1 IMPLANT
GLOVE BIOGEL PI INDICATOR 6 (GLOVE) ×1
GLOVE BIOGEL PI INDICATOR 7.0 (GLOVE) ×4
GLOVE SURG SIGNA 7.5 PF LTX (GLOVE) ×2 IMPLANT
GOWN SPEC L4 XLG W/TWL (GOWN DISPOSABLE) ×2 IMPLANT
GOWN STRL REUS W/ TWL XL LVL3 (GOWN DISPOSABLE) ×3 IMPLANT
GOWN STRL REUS W/TWL LRG LVL3 (GOWN DISPOSABLE) ×2 IMPLANT
GOWN STRL REUS W/TWL XL LVL3 (GOWN DISPOSABLE) ×6
KIT BASIN OR (CUSTOM PROCEDURE TRAY) ×2 IMPLANT
MARKER SKIN DUAL TIP RULER LAB (MISCELLANEOUS) IMPLANT
NDL HYPO 25X1 1.5 SAFETY (NEEDLE) ×1 IMPLANT
NEEDLE HYPO 25X1 1.5 SAFETY (NEEDLE) ×2 IMPLANT
NS IRRIG 1000ML POUR BTL (IV SOLUTION) ×2 IMPLANT
PACK BASIC VI WITH GOWN DISP (CUSTOM PROCEDURE TRAY) ×2 IMPLANT
PAD ABD 8X10 STRL (GAUZE/BANDAGES/DRESSINGS) ×1 IMPLANT
SOL PREP POV-IOD 4OZ 10% (MISCELLANEOUS) ×2 IMPLANT
SPONGE LAP 18X18 X RAY DECT (DISPOSABLE) ×1 IMPLANT
SPONGE LAP 4X18 X RAY DECT (DISPOSABLE) ×4 IMPLANT
STAPLER VISISTAT 35W (STAPLE) IMPLANT
STRIP CLOSURE SKIN 1/2X4 (GAUZE/BANDAGES/DRESSINGS) IMPLANT
SYR CONTROL 10ML LL (SYRINGE) ×2 IMPLANT
SYRINGE 20CC LL (MISCELLANEOUS) ×1 IMPLANT
TOWEL OR 17X26 10 PK STRL BLUE (TOWEL DISPOSABLE) ×2 IMPLANT
WATER STERILE IRR 1500ML POUR (IV SOLUTION) ×2 IMPLANT
YANKAUER SUCT BULB TIP 10FT TU (MISCELLANEOUS) IMPLANT

## 2016-01-28 NOTE — Transfer of Care (Signed)
Immediate Anesthesia Transfer of Care Note  Patient: Ruben Young  Procedure(s) Performed: Procedure(s): EXCISION LEFT BUTTOCKS SINUS TRACH (Left)  Patient Location: PACU  Anesthesia Type:General  Level of Consciousness: awake, alert  and oriented  Airway & Oxygen Therapy: Patient Spontanous Breathing and Patient connected to face mask oxygen  Post-op Assessment: Report given to RN and Post -op Vital signs reviewed and stable  Post vital signs: Reviewed and stable  Last Vitals:  Filed Vitals:   01/28/16 0526  BP: 129/68  Pulse: 63  Temp: 36.5 C  Resp: 16    Last Pain: There were no vitals filed for this visit.       Complications: No apparent anesthesia complications

## 2016-01-28 NOTE — Anesthesia Postprocedure Evaluation (Signed)
Anesthesia Post Note  Patient: Ruben Young  Procedure(s) Performed: Procedure(s) (LRB): EXCISION LEFT BUTTOCKS SINUS TRACH (Left)  Patient location during evaluation: PACU Anesthesia Type: General Level of consciousness: awake and alert Pain management: pain level controlled Vital Signs Assessment: post-procedure vital signs reviewed and stable Respiratory status: spontaneous breathing, nonlabored ventilation, respiratory function stable and patient connected to nasal cannula oxygen Cardiovascular status: blood pressure returned to baseline and stable Postop Assessment: no signs of nausea or vomiting Anesthetic complications: no    Last Vitals:  Filed Vitals:   01/28/16 1134 01/28/16 1224  BP: 136/73 142/74  Pulse: 59 65  Temp: 36.8 C   Resp: 12 13    Last Pain:  Filed Vitals:   01/28/16 1234  PainSc: 0-No pain                 Purcell Jungbluth L

## 2016-01-28 NOTE — Anesthesia Preprocedure Evaluation (Addendum)
Anesthesia Evaluation  Patient identified by MRN, date of birth, ID band Patient awake    Reviewed: Allergy & Precautions, H&P , NPO status , Patient's Chart, lab work & pertinent test results, reviewed documented beta blocker date and time   Airway Mallampati: II  TM Distance: >3 FB Neck ROM: full    Dental no notable dental hx. (+) Dental Advisory Given   Pulmonary neg pulmonary ROS, Current Smoker,    Pulmonary exam normal breath sounds clear to auscultation       Cardiovascular Exercise Tolerance: Good hypertension, Pt. on medications and Pt. on home beta blockers + CAD, + Past MI, + Peripheral Vascular Disease and +CHF  Normal cardiovascular exam Rhythm:regular Rate:Normal  Ischemic cardiomyopathy with chronic systolic heart failure. EF 45%. Grade 1 diastolic dysfunction.   Neuro/Psych negative neurological ROS  negative psych ROS   GI/Hepatic negative GI ROS, Neg liver ROS,   Endo/Other  negative endocrine ROS  Renal/GU negative Renal ROS  negative genitourinary   Musculoskeletal   Abdominal   Peds  Hematology negative hematology ROS (+)   Anesthesia Other Findings   Reproductive/Obstetrics negative OB ROS                           Anesthesia Physical Anesthesia Plan  ASA: III  Anesthesia Plan: General   Post-op Pain Management:    Induction: Intravenous  Airway Management Planned:   Additional Equipment:   Intra-op Plan:   Post-operative Plan: Extubation in OR  Informed Consent: I have reviewed the patients History and Physical, chart, labs and discussed the procedure including the risks, benefits and alternatives for the proposed anesthesia with the patient or authorized representative who has indicated his/her understanding and acceptance.   Dental Advisory Given  Plan Discussed with: CRNA  Anesthesia Plan Comments:         Anesthesia Quick Evaluation

## 2016-01-28 NOTE — Op Note (Addendum)
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  ASSISTANT:   None  ANESTHESIA:   general  Anesthesiologist: Rod Mae, MD CRNA: Glory Buff, CRNA; Lollie Sails, CRNA  General  EBL:  100  ml  BLOOD ADMINISTERED: none  DRAINS: none   LOCAL MEDICATIONS USED:   40 cc 1/4% marcaine  SPECIMEN:   Sinus tract  COUNTS CORRECT:  YES  INDICATIONS FOR PROCEDURE:  [name] is a 65 y.o. (DOB: [birth year]) AA  male whose primary care physician is Maggie Font, MD and comes for excision of an extensive sinus tract of his left buttocks.   An MRI on 10/20/2015 shows an extensive sinus tract process of the left buttocks skin and subcutaneous tissues that appears to communicate with the posterior anus/rectum.  He comes for excision of this extensive tract.   The indications and risks of the surgery were explained to the patient.  The risks include, but are not limited to, infection, bleeding, and nerve injury.  PROCEDURE;  The patient was taken to room #2 at [facility].  He was placed in a prone, jack knifed position.  He was given antibiotics pre op.  The buttocks and perineum were prepped with betadine.   A time out was held and the surgical check list reviewed.   I used an 18 gauge blunt tipped needle to inject methylene blue into the sinus tract.  Each punctum I identified connected with the entire sinus tract.  I made an incision into the skin and into the sinus tract, followed the blue dye and tract, to unroof the extensive subcutaneous tract.  The final wound measured 18 cm and 10 cm.      The sinus tract connected posteriorly to the ano-rectal tissue as a fistula in ano would.  So I think that this originated as a fistula in ano and was just not taken care of.   I could not see a definite opening at the squamo-columnar junction of the anus.   After unroofing the sinus tract, I excised it.  I tried to excise all the blue stained mucosa of the sinus.   He has at least punctums on the right buttocks, but I left those alone today.   I irrigated the wound with 800 cc of saline and packed it with saline damp 4" Curlex.   The patient tolerated the procedure well.  He was sent to the recovery room in good condition.   Pre op sinus tracts seen on the left buttocks.   Post op sinus tract after excision (note, this photo is not oriented as the photo above)  Alphonsa Overall, MD, [facility] Surgery Pager: [phone] Office phone:  [phone]

## 2016-01-28 NOTE — Progress Notes (Signed)
Spoke with pt concerning [place] and agency.  Pt going to stay with his sister Ruben Young [phone], [address], [place], [place] [address]. Pt had no preference, referral given to [facility] in house rep.

## 2016-01-28 NOTE — Anesthesia Procedure Notes (Signed)
Procedure Name: Intubation Date/Time: 01/28/2016 7:33 AM Performed by: Glory Buff Pre-anesthesia Checklist: Patient identified, Emergency Drugs available, Suction available and Patient being monitored Patient Re-evaluated:Patient Re-evaluated prior to inductionOxygen Delivery Method: Circle system utilized Preoxygenation: Pre-oxygenation with 100% oxygen Intubation Type: IV induction Ventilation: Mask ventilation without difficulty Laryngoscope Size: Miller and 3 Grade View: Grade I Tube type: Oral Tube size: 7.5 mm Number of attempts: 1 Airway Equipment and Method: Stylet and Oral airway Placement Confirmation: ETT inserted through vocal cords under direct vision,  positive ETCO2 and breath sounds checked- equal and bilateral Secured at: 21 cm Tube secured with: Tape Dental Injury: Teeth and Oropharynx as per pre-operative assessment

## 2016-01-28 NOTE — Progress Notes (Signed)
Spoke with Cookie, Case Management regarding order for Home Health upon discharge

## 2016-01-28 NOTE — Interval H&P Note (Signed)
History and Physical Interval Note:  01/28/2016 7:23 AM  Ruben Young  has presented today for surgery, with the diagnosis of left buttock sinus trach perianal disease  The various methods of treatment have been discussed with the patient and family.  His sister, Veva Holes, is here today.  After consideration of risks, benefits and other options for treatment, the patient has consented to  Procedure(s): EXCISION LEFT BUTTOCKS SINUS TRACH (Left) as a surgical intervention .  The patient's history has been reviewed, patient examined, no change in status, stable for surgery.  I have reviewed the patient's chart and labs.  Questions were answered to the patient's satisfaction.     Laticha Ferrucci H

## 2016-01-29 MED ORDER — HYDROCODONE-ACETAMINOPHEN 5-325 MG PO TABS: 1.0000 | ORAL_TABLET | Freq: Four times a day (QID) | ORAL | Status: DC | PRN

## 2016-01-29 NOTE — Progress Notes (Signed)
Nurse changed patients dressing to left buttocks due to moderate amount of serosanguinous drainage on dressing, bed pads and sheet. Nurse removed packing and abd pads. Nurse moistened kerlix with saline, packed wound with saline moistened kerlix, covered with abdominal pads, taped with paper tape. Day nurse is aware of drainage amount and looked at dressing with night nurse during report.

## 2016-01-29 NOTE — Discharge Summary (Signed)
Physician Discharge Summary  Patient ID: Ruben Young MRN: [id number] DOB/AGE: Dec 04, 1950 65 y.o.  Admit date: 01/28/2016 Discharge date: 01/29/2016  Patient Care Team: Iona Beard, MD as PCP - General (Family Medicine) Danie Binder, MD as Consulting Physician (Gastroenterology)  Admission Diagnoses: Active Problems:   Fistula-in-ano   Discharge Diagnoses:  Active Problems:   Fistula-in-ano   POST-OPERATIVE DIAGNOSIS:   Left buttock sinus tract perianal disease  SURGERY:  01/28/2016  EXCISION LEFT BUTTOCKS SINUS TRACT Fistulectomy for posterior fistula in ano  SURGEON:    Surgeon(s): Alphonsa Overall, MD  Consults: None  Hospital Course:   The patient underwent the surgery above.  Postoperatively, the patient gradually mobilized and advanced to a solid diet.  Pain and other symptoms were treated aggressively.    By the time of discharge, the patient was walking well the hallways, eating food, having flatus.  Pain was well-controlled on an oral medications.  Based on meeting discharge criteria and continuing to recover, I felt it was safe for the patient to be discharged from the hospital to further recover with close followup. Home health dressing changes.   Postoperative recommendations were discussed in detail.  They are written as well.   Significant Diagnostic Studies:  No results found for this or any previous visit (from the past 72 hour(s)).  No results found.  Discharge Exam: Blood pressure 129/68, pulse 55, temperature 98.1 F (36.7 C), temperature source Oral, resp. rate 16, height 5\' 8"  (1.727 m), weight 70.761 kg (156 lb), SpO2 95 %.  General: Pt awake/alert/oriented x4 in no major acute distress Eyes: PERRL, normal EOM. Sclera nonicteric Neuro: CN II-XII intact w/o focal sensory/motor deficits. Lymph: No head/neck/groin lymphadenopathy Psych:  No delerium/psychosis/paranoia HENT: Normocephalic, Mucus membranes moist.  No thrush Neck: Supple,  No tracheal deviation Chest: No pain.  Good respiratory excursion. CV:  Pulses intact.  Regular rhythm MS: Normal AROM mjr joints.  No obvious deformity Abdomen: Soft, Nondistended.  Nontender.  No incarcerated hernias. Rectal: dressing change done & tolerated.  No active bleeding Ext:  SCDs BLE.  No significant edema.  No cyanosis Skin: No petechiae / purpura  Discharged Condition: good   Past Medical History  Diagnosis Date  . MI (myocardial infarction) ([place])   . DDD (degenerative disc disease), lumbar   . Hypertension   . Hyperlipidemia   . Old myocardial infarction   . Tobacco use disorder     Past Surgical History  Procedure Laterality Date  . Tonsillectomy    . Coronary angioplasty with stent placement    . Left heart catheterization with coronary angiogram N/A 09/24/2012    Procedure: LEFT HEART CATHETERIZATION WITH CORONARY ANGIOGRAM;  Surgeon: Jettie Booze, MD;  [facility]: [facility] CATH LAB;  Service: Cardiovascular;  Laterality: N/A;  . Colonoscopy N/A 10/01/2015    Procedure: COLONOSCOPY;  Surgeon: Danie Binder, MD;  Location: [facility] ENDO SUITE;  Service: Endoscopy;  Laterality: N/A;  70    Social History   Social History  . Marital Status: Single    Spouse Name: N/A  . Number of Children: N/A  . Years of Education: N/A   Occupational History  . Not on file.   Social History Main Topics  . Smoking status: Current Some Day Smoker -- 0.10 packs/day for 48 years    Types: Cigarettes  . Smokeless tobacco: Never Used  . Alcohol Use: No  . Drug Use: No     Comment: per patient has not used in 4 years  .  Sexual Activity: Not on file   Other Topics Concern  . Not on file   Social History Narrative    Family History  Problem Relation Age of Onset  . Cancer Mother   . Heart attack Mother   . Colon cancer Neg Hx     Current Facility-Administered Medications  Medication Dose Route Frequency Provider Last Rate Last Dose  . amLODipine (NORVASC) tablet 5 mg   5 mg Oral Daily Alphonsa Overall, MD      . carvedilol (COREG) tablet 6.25 mg  6.25 mg Oral BID WC Alphonsa Overall, MD   6.25 mg at 01/28/16 1705  . dextrose 5 % and 0.45 % NaCl with KCl 20 mEq/L infusion   Intravenous Continuous Alphonsa Overall, MD 75 mL/hr at 01/29/16 0725    . heparin injection 5,000 Units  5,000 Units Subcutaneous Q8H Alphonsa Overall, MD   5,000 Units at 01/29/16 [phone]  . HYDROcodone-acetaminophen (NORCO/VICODIN) 5-325 MG per tablet 1-2 tablet  1-2 tablet Oral Q4H PRN Alphonsa Overall, MD   1 tablet at 01/29/16 0724  . ibuprofen (ADVIL,MOTRIN) tablet 600 mg  600 mg Oral Q6H PRN Alphonsa Overall, MD      . morphine 2 MG/ML injection 1-3 mg  1-3 mg Intravenous Q2H PRN Alphonsa Overall, MD      . nitroGLYCERIN (NITROSTAT) SL tablet 0.4 mg  0.4 mg Sublingual Q5 min PRN Alphonsa Overall, MD      . ondansetron (ZOFRAN-ODT) disintegrating tablet 4 mg  4 mg Oral Q6H PRN Alphonsa Overall, MD       Or  . ondansetron [facility]) injection 4 mg  4 mg Intravenous Q6H PRN Alphonsa Overall, MD         No Known Allergies  Disposition: 01-Home or Self Care     Medication List    ASK your doctor about these medications        amLODipine 5 MG tablet  Commonly known as:  NORVASC  Take 1 tablet (5 mg total) by mouth daily.     aspirin EC 81 MG tablet  Take 1 tablet (81 mg total) by mouth daily.     atorvastatin 40 MG tablet  Commonly known as:  LIPITOR  Take 40 mg by mouth daily.     carvedilol 6.25 MG tablet  Commonly known as:  COREG  Take 1 tablet (6.25 mg total) by mouth 2 (two) times daily.     Fish Oil 1000 MG Cpdr  Take 1,000 mg by mouth daily.     multivitamin with minerals Tabs tablet  Take 1 tablet by mouth daily.     nitroGLYCERIN 0.4 MG SL tablet  Commonly known as:  NITROSTAT  Place 0.4 mg under the tongue every 5 (five) minutes as needed. For chest pains     oxymetazoline 0.05 % nasal spray  Commonly known as:  AFRIN  Place 1 spray into both nostrils 2 (two) times daily as needed for  congestion.     vitamin C 250 MG tablet  Commonly known as:  ASCORBIC ACID  Take 500 mg by mouth daily.       Follow-up Information    Follow up with [facility] H, MD. Schedule an appointment as soon as possible for a visit in 1 week.   Specialty:  General Surgery   Why:  To follow up after your operation, To follow up after your hospital stay   Contact information:   [place]  [address] [phone]  Signed: Morton Peters, M.D., F.A.C.S. Gastrointestinal and Minimally Invasive Surgery [facility] [address], [place], [place] [id number] [phone] Main / Paging   01/29/2016, 8:40 AM

## 2016-01-29 NOTE — Progress Notes (Signed)
Pt's sister in room and will take patient home. Demonstrated for sister and educated sister on how to perform dressing change. Sister too confirms that [place] care planning to come directly to home starting Monday to perform dressing changes.

## 2016-01-29 NOTE — Discharge Instructions (Signed)
[facility] - DISCHARGE INSTRUCTIONS TO PATIENT  Activity:  Driving - May drive in 7 days, when off meds and doing well   Lifting - No lifting more than 15 pounds for 1 week.      You must be active and get up and walk.  Wound Care:   Change the buttocks dressing once a day (more frequently if there is a lot of drainage)  Diet:  As tolerated  Follow up appointment:  Call Dr. Pollie Friar office [facility]) at [phone] for an appointment in 1 week.  Medications and dosages:  Resume your home medications.  You have a prescription for:  Vicodin  Call Dr. Lucia Gaskins or his office  [phone]) if you have:  Temperature greater than 100.4,  Persistent nausea and vomiting,  Severe uncontrolled pain,  Redness, tenderness, or signs of infection (pain, swelling, redness, odor or green/yellow discharge around the site),  Difficulty breathing, headache or visual disturbances,  Any other questions or concerns you may have after discharge.  In an emergency, call 911 or go to an Emergency Department at a nearby hospital.  [place]  It is important that the wound be kept open.   -Keeping the skin edges apart will allow the wound to gradually heal from the base upwards.   - If the skin edges of the wound close too early, a new fluid pocket can form and infection can occur. -This is the reason to pack deeper wounds with gauze or ribbon -This is why drained wounds cannot be sewed closed right away  A healthy wound should form a lining of bright red "beefy" granulating tissue that will help shrink the wound and help the edges grow new skin into it.   -A little mucus / yellow discharge is normal (the body's natural way to try and form a scab) and should be gently washed off with soap and water with daily dressing changes.  -Green or foul smelling drainage implies bacterial colonization and can slow wound healing - a short course of antibiotic ointment (3-5 days) can help it  clear up.  Call the doctor if it does not improve or worsens  -Avoid use of antibiotic ointments for more than a week as they can slow wound healing over time.    -Sometimes other wound care products will be used to reduce need for dressing changes and/or help clean up dirty wounds -Sometimes the surgeon needs to debride the wound in the office to remove dead or infected tissue out of the wound so it can heal more quickly and safely.    Change the dressing at least once a day -Wash the wound with mild soap and water gently every day.  It is good to shower or bathe the wound to help it clean out. -Use clean 4x4 gauze for medium/large wounds or ribbon plain NU-gauze for smaller wounds (it does not need to be sterile, just clean) -Keep the raw wound moist with a little saline or KY (saline) gel on the gauze.  -A dry wound will take longer to heal.  -Keep the skin dry around the wound to prevent breakdown and irritation. -Pack the wound down to the base -The goal is to keep the skin apart, not overpack the wound -Use a Q-tip or blunt-tipped kabob stick toothpick to push the gauze down to the base in narrow or deep wounds   -Cover with a clean gauze and tape -paper or Medipore tape tend to be gentle on the skin -  rotate the orientation of the tape to avoid repeated stress/trauma on the skin -using an ACE or Coban wrap on wounds on arms or legs can be used instead.  Complete all antibiotics through the entire prescription to help the infection heal and prevent new places of infection   Returning the see the surgeon is helpful to follow the healing process and help the wound close as fast as possible.

## 2016-01-29 NOTE — Progress Notes (Signed)
Pt escorted to lobby in wheelchair via nursing tech to be discharged to home. Pt accompanied by his sister who is driving pt home

## 2016-01-29 NOTE — Progress Notes (Signed)
As reported by previous nurse, pt dressing to left buttock needing to be changed more frequently than scheduled due to moderate amount of serosanguinous drainage on dressing and bed pads, called and advised on call surgeon, dr Marcello Moores, about moderate drainage who states okay to discharge pt since drainage is serosanguineous and moderate in nature.

## 2016-01-30 ENCOUNTER — Encounter (HOSPITAL_COMMUNITY): Payer: Self-pay | Admitting: Surgery

## 2016-01-30 DIAGNOSIS — M150 Primary generalized (osteo)arthritis: Secondary | ICD-10-CM | POA: Diagnosis not present

## 2016-01-30 DIAGNOSIS — Z720 Tobacco use: Secondary | ICD-10-CM | POA: Diagnosis not present

## 2016-01-30 DIAGNOSIS — I110 Hypertensive heart disease with heart failure: Secondary | ICD-10-CM | POA: Diagnosis not present

## 2016-01-30 DIAGNOSIS — I252 Old myocardial infarction: Secondary | ICD-10-CM | POA: Diagnosis not present

## 2016-01-30 DIAGNOSIS — E785 Hyperlipidemia, unspecified: Secondary | ICD-10-CM | POA: Diagnosis not present

## 2016-01-30 DIAGNOSIS — I2510 Atherosclerotic heart disease of native coronary artery without angina pectoris: Secondary | ICD-10-CM | POA: Diagnosis not present

## 2016-01-30 DIAGNOSIS — I509 Heart failure, unspecified: Secondary | ICD-10-CM | POA: Diagnosis not present

## 2016-01-30 DIAGNOSIS — I255 Ischemic cardiomyopathy: Secondary | ICD-10-CM | POA: Diagnosis not present

## 2016-01-30 DIAGNOSIS — Z48817 Encounter for surgical aftercare following surgery on the skin and subcutaneous tissue: Secondary | ICD-10-CM | POA: Diagnosis not present

## 2016-01-31 DIAGNOSIS — I2510 Atherosclerotic heart disease of native coronary artery without angina pectoris: Secondary | ICD-10-CM | POA: Diagnosis not present

## 2016-01-31 DIAGNOSIS — E785 Hyperlipidemia, unspecified: Secondary | ICD-10-CM | POA: Diagnosis not present

## 2016-01-31 DIAGNOSIS — I509 Heart failure, unspecified: Secondary | ICD-10-CM | POA: Diagnosis not present

## 2016-01-31 DIAGNOSIS — Z48817 Encounter for surgical aftercare following surgery on the skin and subcutaneous tissue: Secondary | ICD-10-CM | POA: Diagnosis not present

## 2016-01-31 DIAGNOSIS — M150 Primary generalized (osteo)arthritis: Secondary | ICD-10-CM | POA: Diagnosis not present

## 2016-01-31 DIAGNOSIS — I255 Ischemic cardiomyopathy: Secondary | ICD-10-CM | POA: Diagnosis not present

## 2016-01-31 DIAGNOSIS — I110 Hypertensive heart disease with heart failure: Secondary | ICD-10-CM | POA: Diagnosis not present

## 2016-01-31 DIAGNOSIS — I252 Old myocardial infarction: Secondary | ICD-10-CM | POA: Diagnosis not present

## 2016-01-31 DIAGNOSIS — Z720 Tobacco use: Secondary | ICD-10-CM | POA: Diagnosis not present

## 2016-02-01 DIAGNOSIS — I110 Hypertensive heart disease with heart failure: Secondary | ICD-10-CM | POA: Diagnosis not present

## 2016-02-01 DIAGNOSIS — E785 Hyperlipidemia, unspecified: Secondary | ICD-10-CM | POA: Diagnosis not present

## 2016-02-01 DIAGNOSIS — M150 Primary generalized (osteo)arthritis: Secondary | ICD-10-CM | POA: Diagnosis not present

## 2016-02-01 DIAGNOSIS — I255 Ischemic cardiomyopathy: Secondary | ICD-10-CM | POA: Diagnosis not present

## 2016-02-01 DIAGNOSIS — I509 Heart failure, unspecified: Secondary | ICD-10-CM | POA: Diagnosis not present

## 2016-02-01 DIAGNOSIS — I2510 Atherosclerotic heart disease of native coronary artery without angina pectoris: Secondary | ICD-10-CM | POA: Diagnosis not present

## 2016-02-01 DIAGNOSIS — I252 Old myocardial infarction: Secondary | ICD-10-CM | POA: Diagnosis not present

## 2016-02-01 DIAGNOSIS — Z720 Tobacco use: Secondary | ICD-10-CM | POA: Diagnosis not present

## 2016-02-01 DIAGNOSIS — Z48817 Encounter for surgical aftercare following surgery on the skin and subcutaneous tissue: Secondary | ICD-10-CM | POA: Diagnosis not present

## 2016-02-04 DIAGNOSIS — I110 Hypertensive heart disease with heart failure: Secondary | ICD-10-CM | POA: Diagnosis not present

## 2016-02-04 DIAGNOSIS — I509 Heart failure, unspecified: Secondary | ICD-10-CM | POA: Diagnosis not present

## 2016-02-04 DIAGNOSIS — Z720 Tobacco use: Secondary | ICD-10-CM | POA: Diagnosis not present

## 2016-02-04 DIAGNOSIS — I255 Ischemic cardiomyopathy: Secondary | ICD-10-CM | POA: Diagnosis not present

## 2016-02-04 DIAGNOSIS — E785 Hyperlipidemia, unspecified: Secondary | ICD-10-CM | POA: Diagnosis not present

## 2016-02-04 DIAGNOSIS — I252 Old myocardial infarction: Secondary | ICD-10-CM | POA: Diagnosis not present

## 2016-02-04 DIAGNOSIS — M150 Primary generalized (osteo)arthritis: Secondary | ICD-10-CM | POA: Diagnosis not present

## 2016-02-04 DIAGNOSIS — Z48817 Encounter for surgical aftercare following surgery on the skin and subcutaneous tissue: Secondary | ICD-10-CM | POA: Diagnosis not present

## 2016-02-04 DIAGNOSIS — I2510 Atherosclerotic heart disease of native coronary artery without angina pectoris: Secondary | ICD-10-CM | POA: Diagnosis not present

## 2016-02-05 DIAGNOSIS — Z48817 Encounter for surgical aftercare following surgery on the skin and subcutaneous tissue: Secondary | ICD-10-CM | POA: Diagnosis not present

## 2016-02-05 DIAGNOSIS — I252 Old myocardial infarction: Secondary | ICD-10-CM | POA: Diagnosis not present

## 2016-02-05 DIAGNOSIS — M150 Primary generalized (osteo)arthritis: Secondary | ICD-10-CM | POA: Diagnosis not present

## 2016-02-05 DIAGNOSIS — Z720 Tobacco use: Secondary | ICD-10-CM | POA: Diagnosis not present

## 2016-02-05 DIAGNOSIS — E785 Hyperlipidemia, unspecified: Secondary | ICD-10-CM | POA: Diagnosis not present

## 2016-02-05 DIAGNOSIS — I110 Hypertensive heart disease with heart failure: Secondary | ICD-10-CM | POA: Diagnosis not present

## 2016-02-05 DIAGNOSIS — I509 Heart failure, unspecified: Secondary | ICD-10-CM | POA: Diagnosis not present

## 2016-02-05 DIAGNOSIS — I255 Ischemic cardiomyopathy: Secondary | ICD-10-CM | POA: Diagnosis not present

## 2016-02-05 DIAGNOSIS — I2510 Atherosclerotic heart disease of native coronary artery without angina pectoris: Secondary | ICD-10-CM | POA: Diagnosis not present

## 2016-02-07 DIAGNOSIS — M150 Primary generalized (osteo)arthritis: Secondary | ICD-10-CM | POA: Diagnosis not present

## 2016-02-07 DIAGNOSIS — I509 Heart failure, unspecified: Secondary | ICD-10-CM | POA: Diagnosis not present

## 2016-02-07 DIAGNOSIS — Z720 Tobacco use: Secondary | ICD-10-CM | POA: Diagnosis not present

## 2016-02-07 DIAGNOSIS — Z48817 Encounter for surgical aftercare following surgery on the skin and subcutaneous tissue: Secondary | ICD-10-CM | POA: Diagnosis not present

## 2016-02-07 DIAGNOSIS — I252 Old myocardial infarction: Secondary | ICD-10-CM | POA: Diagnosis not present

## 2016-02-07 DIAGNOSIS — I255 Ischemic cardiomyopathy: Secondary | ICD-10-CM | POA: Diagnosis not present

## 2016-02-07 DIAGNOSIS — E785 Hyperlipidemia, unspecified: Secondary | ICD-10-CM | POA: Diagnosis not present

## 2016-02-07 DIAGNOSIS — I110 Hypertensive heart disease with heart failure: Secondary | ICD-10-CM | POA: Diagnosis not present

## 2016-02-07 DIAGNOSIS — I2510 Atherosclerotic heart disease of native coronary artery without angina pectoris: Secondary | ICD-10-CM | POA: Diagnosis not present

## 2016-02-11 DIAGNOSIS — Z48817 Encounter for surgical aftercare following surgery on the skin and subcutaneous tissue: Secondary | ICD-10-CM | POA: Diagnosis not present

## 2016-02-11 DIAGNOSIS — Z720 Tobacco use: Secondary | ICD-10-CM | POA: Diagnosis not present

## 2016-02-11 DIAGNOSIS — I110 Hypertensive heart disease with heart failure: Secondary | ICD-10-CM | POA: Diagnosis not present

## 2016-02-11 DIAGNOSIS — I252 Old myocardial infarction: Secondary | ICD-10-CM | POA: Diagnosis not present

## 2016-02-11 DIAGNOSIS — I255 Ischemic cardiomyopathy: Secondary | ICD-10-CM | POA: Diagnosis not present

## 2016-02-11 DIAGNOSIS — I509 Heart failure, unspecified: Secondary | ICD-10-CM | POA: Diagnosis not present

## 2016-02-11 DIAGNOSIS — I2510 Atherosclerotic heart disease of native coronary artery without angina pectoris: Secondary | ICD-10-CM | POA: Diagnosis not present

## 2016-02-11 DIAGNOSIS — M150 Primary generalized (osteo)arthritis: Secondary | ICD-10-CM | POA: Diagnosis not present

## 2016-02-11 DIAGNOSIS — E785 Hyperlipidemia, unspecified: Secondary | ICD-10-CM | POA: Diagnosis not present

## 2016-02-15 DIAGNOSIS — I255 Ischemic cardiomyopathy: Secondary | ICD-10-CM | POA: Diagnosis not present

## 2016-02-15 DIAGNOSIS — I110 Hypertensive heart disease with heart failure: Secondary | ICD-10-CM | POA: Diagnosis not present

## 2016-02-15 DIAGNOSIS — M150 Primary generalized (osteo)arthritis: Secondary | ICD-10-CM | POA: Diagnosis not present

## 2016-02-15 DIAGNOSIS — Z48817 Encounter for surgical aftercare following surgery on the skin and subcutaneous tissue: Secondary | ICD-10-CM | POA: Diagnosis not present

## 2016-02-15 DIAGNOSIS — I509 Heart failure, unspecified: Secondary | ICD-10-CM | POA: Diagnosis not present

## 2016-02-15 DIAGNOSIS — I2510 Atherosclerotic heart disease of native coronary artery without angina pectoris: Secondary | ICD-10-CM | POA: Diagnosis not present

## 2016-02-15 DIAGNOSIS — Z720 Tobacco use: Secondary | ICD-10-CM | POA: Diagnosis not present

## 2016-02-15 DIAGNOSIS — E785 Hyperlipidemia, unspecified: Secondary | ICD-10-CM | POA: Diagnosis not present

## 2016-02-15 DIAGNOSIS — I252 Old myocardial infarction: Secondary | ICD-10-CM | POA: Diagnosis not present

## 2016-02-17 DIAGNOSIS — I110 Hypertensive heart disease with heart failure: Secondary | ICD-10-CM | POA: Diagnosis not present

## 2016-02-17 DIAGNOSIS — Z48817 Encounter for surgical aftercare following surgery on the skin and subcutaneous tissue: Secondary | ICD-10-CM | POA: Diagnosis not present

## 2016-02-17 DIAGNOSIS — Z720 Tobacco use: Secondary | ICD-10-CM | POA: Diagnosis not present

## 2016-02-17 DIAGNOSIS — I255 Ischemic cardiomyopathy: Secondary | ICD-10-CM | POA: Diagnosis not present

## 2016-02-17 DIAGNOSIS — M150 Primary generalized (osteo)arthritis: Secondary | ICD-10-CM | POA: Diagnosis not present

## 2016-02-17 DIAGNOSIS — I2510 Atherosclerotic heart disease of native coronary artery without angina pectoris: Secondary | ICD-10-CM | POA: Diagnosis not present

## 2016-02-17 DIAGNOSIS — I252 Old myocardial infarction: Secondary | ICD-10-CM | POA: Diagnosis not present

## 2016-02-17 DIAGNOSIS — I509 Heart failure, unspecified: Secondary | ICD-10-CM | POA: Diagnosis not present

## 2016-02-17 DIAGNOSIS — E785 Hyperlipidemia, unspecified: Secondary | ICD-10-CM | POA: Diagnosis not present

## 2016-02-22 DIAGNOSIS — Z720 Tobacco use: Secondary | ICD-10-CM | POA: Diagnosis not present

## 2016-02-22 DIAGNOSIS — Z48817 Encounter for surgical aftercare following surgery on the skin and subcutaneous tissue: Secondary | ICD-10-CM | POA: Diagnosis not present

## 2016-02-22 DIAGNOSIS — I110 Hypertensive heart disease with heart failure: Secondary | ICD-10-CM | POA: Diagnosis not present

## 2016-02-22 DIAGNOSIS — I509 Heart failure, unspecified: Secondary | ICD-10-CM | POA: Diagnosis not present

## 2016-02-22 DIAGNOSIS — M150 Primary generalized (osteo)arthritis: Secondary | ICD-10-CM | POA: Diagnosis not present

## 2016-02-22 DIAGNOSIS — E785 Hyperlipidemia, unspecified: Secondary | ICD-10-CM | POA: Diagnosis not present

## 2016-02-22 DIAGNOSIS — I252 Old myocardial infarction: Secondary | ICD-10-CM | POA: Diagnosis not present

## 2016-02-22 DIAGNOSIS — I2510 Atherosclerotic heart disease of native coronary artery without angina pectoris: Secondary | ICD-10-CM | POA: Diagnosis not present

## 2016-02-22 DIAGNOSIS — I255 Ischemic cardiomyopathy: Secondary | ICD-10-CM | POA: Diagnosis not present

## 2016-02-23 ENCOUNTER — Ambulatory Visit: Payer: Commercial Managed Care - HMO | Admitting: Family

## 2016-02-23 ENCOUNTER — Encounter (HOSPITAL_COMMUNITY): Payer: Commercial Managed Care - HMO

## 2016-02-25 DIAGNOSIS — I252 Old myocardial infarction: Secondary | ICD-10-CM | POA: Diagnosis not present

## 2016-02-25 DIAGNOSIS — M150 Primary generalized (osteo)arthritis: Secondary | ICD-10-CM | POA: Diagnosis not present

## 2016-02-25 DIAGNOSIS — I110 Hypertensive heart disease with heart failure: Secondary | ICD-10-CM | POA: Diagnosis not present

## 2016-02-25 DIAGNOSIS — I255 Ischemic cardiomyopathy: Secondary | ICD-10-CM | POA: Diagnosis not present

## 2016-02-25 DIAGNOSIS — E785 Hyperlipidemia, unspecified: Secondary | ICD-10-CM | POA: Diagnosis not present

## 2016-02-25 DIAGNOSIS — I2510 Atherosclerotic heart disease of native coronary artery without angina pectoris: Secondary | ICD-10-CM | POA: Diagnosis not present

## 2016-02-25 DIAGNOSIS — Z48817 Encounter for surgical aftercare following surgery on the skin and subcutaneous tissue: Secondary | ICD-10-CM | POA: Diagnosis not present

## 2016-02-25 DIAGNOSIS — I509 Heart failure, unspecified: Secondary | ICD-10-CM | POA: Diagnosis not present

## 2016-02-25 DIAGNOSIS — Z720 Tobacco use: Secondary | ICD-10-CM | POA: Diagnosis not present

## 2016-03-01 DIAGNOSIS — E785 Hyperlipidemia, unspecified: Secondary | ICD-10-CM | POA: Diagnosis not present

## 2016-03-01 DIAGNOSIS — I509 Heart failure, unspecified: Secondary | ICD-10-CM | POA: Diagnosis not present

## 2016-03-01 DIAGNOSIS — Z48817 Encounter for surgical aftercare following surgery on the skin and subcutaneous tissue: Secondary | ICD-10-CM | POA: Diagnosis not present

## 2016-03-01 DIAGNOSIS — M150 Primary generalized (osteo)arthritis: Secondary | ICD-10-CM | POA: Diagnosis not present

## 2016-03-01 DIAGNOSIS — I255 Ischemic cardiomyopathy: Secondary | ICD-10-CM | POA: Diagnosis not present

## 2016-03-01 DIAGNOSIS — I252 Old myocardial infarction: Secondary | ICD-10-CM | POA: Diagnosis not present

## 2016-03-01 DIAGNOSIS — I110 Hypertensive heart disease with heart failure: Secondary | ICD-10-CM | POA: Diagnosis not present

## 2016-03-01 DIAGNOSIS — I2510 Atherosclerotic heart disease of native coronary artery without angina pectoris: Secondary | ICD-10-CM | POA: Diagnosis not present

## 2016-03-01 DIAGNOSIS — Z720 Tobacco use: Secondary | ICD-10-CM | POA: Diagnosis not present

## 2016-03-02 ENCOUNTER — Encounter: Payer: Self-pay | Admitting: Family

## 2016-03-07 DIAGNOSIS — I255 Ischemic cardiomyopathy: Secondary | ICD-10-CM | POA: Diagnosis not present

## 2016-03-07 DIAGNOSIS — Z48817 Encounter for surgical aftercare following surgery on the skin and subcutaneous tissue: Secondary | ICD-10-CM | POA: Diagnosis not present

## 2016-03-07 DIAGNOSIS — M150 Primary generalized (osteo)arthritis: Secondary | ICD-10-CM | POA: Diagnosis not present

## 2016-03-07 DIAGNOSIS — I509 Heart failure, unspecified: Secondary | ICD-10-CM | POA: Diagnosis not present

## 2016-03-07 DIAGNOSIS — I252 Old myocardial infarction: Secondary | ICD-10-CM | POA: Diagnosis not present

## 2016-03-07 DIAGNOSIS — I110 Hypertensive heart disease with heart failure: Secondary | ICD-10-CM | POA: Diagnosis not present

## 2016-03-07 DIAGNOSIS — E785 Hyperlipidemia, unspecified: Secondary | ICD-10-CM | POA: Diagnosis not present

## 2016-03-07 DIAGNOSIS — Z720 Tobacco use: Secondary | ICD-10-CM | POA: Diagnosis not present

## 2016-03-07 DIAGNOSIS — I2510 Atherosclerotic heart disease of native coronary artery without angina pectoris: Secondary | ICD-10-CM | POA: Diagnosis not present

## 2016-03-08 ENCOUNTER — Ambulatory Visit (INDEPENDENT_AMBULATORY_CARE_PROVIDER_SITE_OTHER): Payer: Commercial Managed Care - HMO | Admitting: Family

## 2016-03-08 ENCOUNTER — Encounter: Payer: Self-pay | Admitting: Family

## 2016-03-08 ENCOUNTER — Ambulatory Visit (HOSPITAL_COMMUNITY)
Admission: RE | Admit: 2016-03-08 | Discharge: 2016-03-08 | Disposition: A | Discharge status: Home / Self Care | Payer: Commercial Managed Care - HMO | Source: Ambulatory Visit | Attending: Family | Admitting: Family

## 2016-03-08 VITALS — BP 127/80 | HR 55 | Temp 97.3°F | Resp 16 | Ht 68.0 in | Wt 148.0 lb

## 2016-03-08 DIAGNOSIS — R1030 Lower abdominal pain, unspecified: Secondary | ICD-10-CM | POA: Diagnosis not present

## 2016-03-08 DIAGNOSIS — I723 Aneurysm of iliac artery: Secondary | ICD-10-CM

## 2016-03-08 DIAGNOSIS — R1031 Right lower quadrant pain: Secondary | ICD-10-CM

## 2016-03-08 DIAGNOSIS — Z09 Encounter for follow-up examination after completed treatment for conditions other than malignant neoplasm: Secondary | ICD-10-CM | POA: Diagnosis present

## 2016-03-08 DIAGNOSIS — F17200 Nicotine dependence, unspecified, uncomplicated: Secondary | ICD-10-CM

## 2016-03-08 DIAGNOSIS — I10 Essential (primary) hypertension: Secondary | ICD-10-CM | POA: Insufficient documentation

## 2016-03-08 DIAGNOSIS — E785 Hyperlipidemia, unspecified: Secondary | ICD-10-CM | POA: Diagnosis not present

## 2016-03-08 DIAGNOSIS — Z720 Tobacco use: Secondary | ICD-10-CM | POA: Diagnosis not present

## 2016-03-08 DIAGNOSIS — R1032 Left lower quadrant pain: Secondary | ICD-10-CM
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
## 2016-03-08 NOTE — Patient Instructions (Signed)

## 2016-04-04 ENCOUNTER — Other Ambulatory Visit: Payer: Self-pay | Admitting: Family

## 2016-04-04 DIAGNOSIS — I723 Aneurysm of iliac artery: Secondary | ICD-10-CM

## 2016-05-05 ENCOUNTER — Other Ambulatory Visit: Payer: Self-pay | Admitting: Pharmacist

## 2016-05-05 NOTE — Patient Outreach (Signed)
Outreach call to Ruben Young regarding his request for follow up from the [facility] Medication Adherence Campaign. HIPAA identifiers verified and verbal consent received.   Patient reports that he has been taking his atorvastatin daily as directed. Denies any missed doses or any barriers to taking his medications such as cost or side effects.   Patient reports that he has no medication questions or concerns at this time. Provide patient with my phone number.  Harlow Asa, PharmD Clinical Pharmacist [place] [phone]

## 2016-05-31 ENCOUNTER — Other Ambulatory Visit: Payer: Self-pay | Admitting: Cardiology

## 2016-06-07 DIAGNOSIS — Z23 Encounter for immunization: Secondary | ICD-10-CM | POA: Diagnosis not present

## 2016-06-07 DIAGNOSIS — I10 Essential (primary) hypertension: Secondary | ICD-10-CM | POA: Diagnosis not present

## 2016-06-07 DIAGNOSIS — E785 Hyperlipidemia, unspecified: Secondary | ICD-10-CM | POA: Diagnosis not present

## 2016-06-07 DIAGNOSIS — R5383 Other fatigue: Secondary | ICD-10-CM | POA: Diagnosis not present

## 2016-07-21 DIAGNOSIS — H524 Presbyopia: Secondary | ICD-10-CM | POA: Diagnosis not present

## 2016-07-28 DIAGNOSIS — Z0100 Encounter for examination of eyes and vision without abnormal findings: Secondary | ICD-10-CM | POA: Diagnosis not present

## 2016-08-14 ENCOUNTER — Other Ambulatory Visit: Payer: Self-pay | Admitting: Cardiology

## 2016-09-11 DIAGNOSIS — M25551 Pain in right hip: Secondary | ICD-10-CM | POA: Diagnosis not present

## 2016-09-11 DIAGNOSIS — I10 Essential (primary) hypertension: Secondary | ICD-10-CM | POA: Diagnosis not present

## 2016-10-11 DIAGNOSIS — L02426 Furuncle of left lower limb: Secondary | ICD-10-CM | POA: Diagnosis not present

## 2016-10-12 DIAGNOSIS — L0231 Cutaneous abscess of buttock: Secondary | ICD-10-CM | POA: Diagnosis not present

## 2016-10-18 ENCOUNTER — Other Ambulatory Visit: Payer: Self-pay | Admitting: Cardiology

## 2016-11-10 ENCOUNTER — Other Ambulatory Visit: Payer: Self-pay | Admitting: Cardiology

## 2016-11-10 NOTE — Telephone Encounter (Signed)
carvedilol (COREG) 6.25 MG tablet  Medication  Date: 10/18/2016 Department: [address] Office Ordering/Authorizing: Jerline Pain, MD  Order Providers   Prescribing Provider Encounter Provider  Jerline Pain, MD Jerline Pain, MD  Medication Detail    Disp Refills Start End   carvedilol (COREG) 6.25 MG tablet 60 tablet 0 10/18/2016    Sig: TAKE 1 TABLET TWO TIMES DAILY (PLEASE CALL AND SCHEDULE A ONE YEAR FOLLOW UP APPOINTMENT)   E-Prescribing Status: Receipt confirmed by pharmacy (10/18/2016 12:25 PM EDT)   Pharmacy   [place] [place], [place]

## 2016-11-27 ENCOUNTER — Other Ambulatory Visit: Payer: Self-pay | Admitting: Cardiology

## 2016-12-22 ENCOUNTER — Emergency Department (HOSPITAL_COMMUNITY)
Admission: EM | Admit: 2016-12-22 | Discharge: 2016-12-23 | Disposition: A | Discharge status: Home / Self Care | Payer: Medicare HMO | Attending: Emergency Medicine | Admitting: Emergency Medicine

## 2016-12-22 DIAGNOSIS — Z79899 Other long term (current) drug therapy: Secondary | ICD-10-CM | POA: Diagnosis not present

## 2016-12-22 DIAGNOSIS — W272XXA Contact with scissors, initial encounter: Secondary | ICD-10-CM | POA: Insufficient documentation

## 2016-12-22 DIAGNOSIS — S0502XA Injury of conjunctiva and corneal abrasion without foreign body, left eye, initial encounter: Secondary | ICD-10-CM | POA: Insufficient documentation

## 2016-12-22 DIAGNOSIS — Y929 Unspecified place or not applicable: Secondary | ICD-10-CM | POA: Insufficient documentation

## 2016-12-22 DIAGNOSIS — I5022 Chronic systolic (congestive) heart failure: Secondary | ICD-10-CM | POA: Insufficient documentation

## 2016-12-22 DIAGNOSIS — Z7982 Long term (current) use of aspirin: Secondary | ICD-10-CM | POA: Diagnosis not present

## 2016-12-22 DIAGNOSIS — F17210 Nicotine dependence, cigarettes, uncomplicated: Secondary | ICD-10-CM | POA: Insufficient documentation

## 2016-12-22 DIAGNOSIS — Y999 Unspecified external cause status: Secondary | ICD-10-CM | POA: Diagnosis not present

## 2016-12-22 DIAGNOSIS — Y939 Activity, unspecified: Secondary | ICD-10-CM | POA: Diagnosis not present

## 2016-12-22 DIAGNOSIS — I110 Hypertensive heart disease with heart failure: Secondary | ICD-10-CM | POA: Insufficient documentation

## 2016-12-22 MED ORDER — FLUORESCEIN SODIUM 0.6 MG OP STRP
1.0000 | ORAL_STRIP | Freq: Once | OPHTHALMIC | Status: AC
  Administered 2016-12-22: 1 via OPHTHALMIC
  Filled 2016-12-22: qty 1

## 2016-12-22 MED ORDER — TETRACAINE HCL 0.5 % OP SOLN
1.0000 [drp] | Freq: Once | OPHTHALMIC | Status: AC
  Administered 2016-12-22: 1 [drp] via OPHTHALMIC
  Filled 2016-12-22: qty 4

## 2016-12-22 NOTE — ED Triage Notes (Signed)
Pt accidentally stuck left eye with scissors more or less 2 hours ago He is sniffing as if nose of running  Has no PCP

## 2016-12-22 NOTE — ED Provider Notes (Signed)
**Note Ruben-Identified via Obfuscation** [facility] DEPT Provider Note   CSN: [id number] Arrival date & time: 12/22/16  2118  By signing my name below, I, Mayer Masker, attest that this documentation has been prepared under the direction and in the presence of No att. providers found. Electronically Signed: Mayer Masker, Scribe. 12/22/16. 11:39 PM.   History   Chief Complaint Chief Complaint  Patient presents with  . Eye Injury    stuck scissors in eye   The history is provided by the patient. No language interpreter was used.   HPI Comments: Ruben Young is a 66 y.o. male who presents to the Emergency Department complaining of constant eye discomfort s/p an injury to the eye with scissors that occurred prior to arrival. He states the scissors were on a string and they spun around and hit his eye. He denies pain, he describes it as bothersome and as if there's something in his eye. He notes he can still see out of it. He denies other associated injuries or symptoms.  Past Medical History:  Diagnosis Date  . DDD (degenerative disc disease), lumbar   . Hyperlipidemia   . Hypertension   . MI (myocardial infarction)   . Old myocardial infarction   . Tobacco use disorder     Patient Active Problem List   Diagnosis Date Noted  . Fistula-in-ano 01/28/2016  . Special screening for malignant neoplasms, colon   . Encounter for screening colonoscopy 09/16/2015  . Hematochezia 09/16/2015  . Chronic systolic heart failure ([place]) 04/14/2015  . Tobacco abuse 04/14/2015  . Iliac artery aneurysm, left ([place]) 04/14/2015  . Old MI (myocardial infarction) 04/14/2015  . Essential hypertension 04/14/2015  . Facial cellulitis 11/08/2014  . Aftercare following surgery of the circulatory system, [place] 02/25/2014  . HLD (hyperlipidemia) 02/10/2014  . Coronary atherosclerosis of native coronary artery 11/14/2013  . Essential hypertension, benign 11/14/2013  . Cardiomyopathy, ischemic 11/14/2013  . Acute anterior myocardial  infarction ([place]) 09/25/2012  . Tobacco use disorder   . Old myocardial infarction   . Aneurysm of iliac artery ([place]) 02/21/2012    Past Surgical History:  Procedure Laterality Date  . COLONOSCOPY N/A 10/01/2015   Procedure: COLONOSCOPY;  Surgeon: Danie Binder, MD;  [facility];  Service: Endoscopy;  Laterality: N/A;  245  . CORONARY ANGIOPLASTY WITH STENT PLACEMENT    . EXCISION MASS LOWER EXTREMETIES Left 01/28/2016   Procedure: EXCISION LEFT BUTTOCKS SINUS TRACH;  Surgeon: Alphonsa Overall, MD;  Location: [facility];  Service: General;  Laterality: Left;  . LEFT HEART CATHETERIZATION WITH CORONARY ANGIOGRAM N/A 09/24/2012   Procedure: LEFT HEART CATHETERIZATION WITH CORONARY ANGIOGRAM;  Surgeon: Jettie Booze, MD;  Location: [facility] CATH LAB;  Service: Cardiovascular;  Laterality: N/A;  . TONSILLECTOMY         Home Medications    Prior to Admission medications   Medication Sig Start Date End Date Taking? Authorizing Provider  amLODipine (NORVASC) 5 MG tablet Take 1 tablet (5 mg total) by mouth daily. 10/04/15  Yes Jerline Pain, MD  aspirin 81 MG tablet Take 1 tablet (81 mg total) by mouth daily. 04/14/15  Yes Jerline Pain, MD  atorvastatin (LIPITOR) 40 MG tablet Take 40 mg by mouth daily at 6 PM.  09/03/15  Yes [provider]  carvedilol (COREG) 6.25 MG tablet Take 1 tablet (6.25 mg total) by mouth 2 (two) times daily with a meal. APPT NEEDED, CALL [phone] TO SCHEDULE, LAST ATTEMPT 11/10/16  Yes Jerline Pain, MD  Multiple Vitamin (MULTIVITAMIN WITH MINERALS) TABS tablet Take 1 tablet by mouth daily.   Yes [provider]  nitroGLYCERIN (NITROSTAT) 0.4 MG SL tablet Place 0.4 mg under the tongue every 5 (five) minutes as needed. For chest pains   Yes [provider]  oxymetazoline (AFRIN) 0.05 % nasal spray Place 1 spray into both nostrils 2 (two) times daily as needed for congestion.   Yes [provider]  vitamin C (ASCORBIC ACID) 250 MG  tablet Take 500 mg by mouth daily.   Yes [provider]  VITAMIN E PO Take 1 tablet by mouth daily.   Yes [provider]  oxyCODONE-acetaminophen (PERCOCET) 5-325 MG tablet Take 1 tablet by mouth every 4 (four) hours as needed. 12/23/16   Orpah Greek, MD    Family History Family History  Problem Relation Age of Onset  . Cancer Mother   . Heart attack Mother   . Colon cancer Neg Hx     Social History Social History  Substance Use Topics  . Smoking status: Current Some Day Smoker    Packs/day: 0.10    Years: 48.00    Types: Cigarettes  . Smokeless tobacco: Never Used  . Alcohol use No     Allergies   Patient has no known allergies.   Review of Systems Review of Systems  Constitutional: Negative for fever.  Eyes:       Eye discomfort  All other systems reviewed and are negative.    Physical Exam Updated Vital Signs BP (!) 145/82 (BP Location: Left Arm)   Pulse 69   Temp 98.3 F (36.8 C) (Oral)   Resp 16   Ht 5\' 8"  (1.727 m)   Wt 71.2 kg (157 lb)   SpO2 99%   BMI 23.87 kg/m   Physical Exam  Constitutional: He is oriented to person, place, and time. He appears well-developed and well-nourished. No distress.  HENT:  Head: Normocephalic and atraumatic.  Right Ear: Hearing normal.  Left Ear: Hearing normal.  Nose: Nose normal.  Mouth/Throat: Oropharynx is clear and moist and mucous membranes are normal.  Eyes: Conjunctivae, EOM and lids are normal. Pupils are equal, round, and reactive to light.  IOP 17 left eye  Fluoroscein exam:  Linear abrasion from central cornea towards 6 o'clock, 30mm long  NEGATIVE SEIDEL  Neck: Normal range of motion. Neck supple.  Cardiovascular: Regular rhythm, S1 normal and S2 normal.  Exam reveals no gallop and no friction rub.   No murmur heard. Pulmonary/Chest: Effort normal and breath sounds normal. No respiratory distress. He exhibits no tenderness.  Abdominal: Soft. Normal appearance and  bowel sounds are normal. There is no hepatosplenomegaly. There is no tenderness. There is no rebound, no guarding, no tenderness at McBurney's point and negative Murphy's sign. No hernia.  Musculoskeletal: Normal range of motion.  Neurological: He is alert and oriented to person, place, and time. He has normal strength. No cranial nerve deficit or sensory deficit. Coordination normal. GCS eye subscore is 4. GCS verbal subscore is 5. GCS motor subscore is 6.  Skin: Skin is warm, dry and intact. No rash noted. No cyanosis.  Psychiatric: He has a normal mood and affect. His speech is normal and behavior is normal. Thought content normal.  Nursing note and vitals reviewed.    ED Treatments / Results  DIAGNOSTIC STUDIES: Oxygen Saturation is 99% on RA, normal by my interpretation.    COORDINATION OF CARE: 11:09 PM Discussed treatment plan with pt at  bedside and pt agreed to plan.  Labs (all labs ordered are listed, but only abnormal results are displayed) Labs Reviewed - No data to display  EKG  EKG Interpretation None       Radiology No results found.  Procedures Procedures (including critical care time)  Medications Ordered in ED Medications  tetracaine (PONTOCAINE) 0.5 % ophthalmic solution 1 drop (1 drop Left Eye Given 12/22/16 2358)  fluorescein ophthalmic strip 1 strip (1 strip Left Eye Given 12/22/16 2359)  cyclopentolate (CYCLODRYL,CYCLOGYL) 1 % ophthalmic solution 2 drop (2 drops Left Eye Given 12/23/16 0112)     Initial Impression / Assessment and Plan / ED Course  I have reviewed the triage vital signs and the nursing notes.  Pertinent labs & imaging results that were available during my care of the patient were reviewed by me and considered in my medical decision making (see chart for details).    Patient presents with complaints of left eye pain after accidentally striking himself in the eye with a pair of scissors. Patient reports that he hasn't noticed any vision  change, but is experiencing irritation and feels like there is something in his eye.  After putting tetracaine drops in the eye, pain has completely resolved. Examination reveals a linear abrasion without evidence of globe rupture. Seidel was negative. Patient's intraocular pressure was normal.  Examination is consistent with isolated abrasion without any deeper injury to the eye. Patient treated with cycloplegia here in the ER and will use tobramycin ophthalmic drops for the next 5 days.   Final Clinical Impressions(s) / ED Diagnoses   Final diagnoses:  Abrasion of left cornea, initial encounter    New Prescriptions Discharge Medication List as of 12/23/2016 12:19 AM    START taking these medications   Details  oxyCODONE-acetaminophen (PERCOCET) 5-325 MG tablet Take 1 tablet by mouth every 4 (four) hours as needed., Starting Sat 12/23/2016, Print      I personally performed the services described in this documentation, which was scribed in my presence. The recorded information has been reviewed and is accurate.     Orpah Greek, MD 12/23/16 [phone]

## 2016-12-23 DIAGNOSIS — S0502XA Injury of conjunctiva and corneal abrasion without foreign body, left eye, initial encounter: Secondary | ICD-10-CM | POA: Diagnosis not present

## 2016-12-23 MED ORDER — CYCLOPENTOLATE HCL 1 % OP SOLN
2.0000 [drp] | Freq: Once | OPHTHALMIC | Status: AC
  Administered 2016-12-23: 2 [drp] via OPHTHALMIC
  Filled 2016-12-23: qty 2

## 2016-12-23 MED ORDER — CYCLOPENTOLATE HCL 2 % OP SOLN
2.0000 [drp] | Freq: Once | OPHTHALMIC | Status: DC
  Filled 2016-12-23: qty 2

## 2016-12-23 MED ORDER — OXYCODONE-ACETAMINOPHEN 5-325 MG PO TABS: 1.0000 | ORAL_TABLET | ORAL | 0 refills | Status: DC | PRN

## 2016-12-23 MED ORDER — CYCLOPENTOLATE-PHENYLEPHRINE 0.2-1 % OP SOLN
OPHTHALMIC | Status: AC
  Filled 2016-12-23: qty 2

## 2016-12-23 MED ORDER — TOBRAMYCIN 0.3 % OP SOLN
1.0000 [drp] | OPHTHALMIC | Status: DC
  Administered 2016-12-23: 1 [drp] via OPHTHALMIC
  Filled 2016-12-23: qty 5

## 2016-12-23 NOTE — ED Notes (Signed)
Pt given Oxycodone take home bottle

## 2016-12-29 MED FILL — Oxycodone w/ Acetaminophen Tab 5-325 MG: ORAL | Qty: 6 | Status: AC

## 2017-01-16 DIAGNOSIS — Z720 Tobacco use: Secondary | ICD-10-CM | POA: Diagnosis not present

## 2017-01-16 DIAGNOSIS — R634 Abnormal weight loss: Secondary | ICD-10-CM | POA: Diagnosis not present

## 2017-01-16 DIAGNOSIS — Z125 Encounter for screening for malignant neoplasm of prostate: Secondary | ICD-10-CM | POA: Diagnosis not present

## 2017-01-16 DIAGNOSIS — I2510 Atherosclerotic heart disease of native coronary artery without angina pectoris: Secondary | ICD-10-CM | POA: Diagnosis not present

## 2017-01-16 DIAGNOSIS — I10 Essential (primary) hypertension: Secondary | ICD-10-CM | POA: Diagnosis not present

## 2017-02-20 ENCOUNTER — Encounter: Payer: Self-pay | Admitting: Family

## 2017-03-07 ENCOUNTER — Ambulatory Visit (INDEPENDENT_AMBULATORY_CARE_PROVIDER_SITE_OTHER): Payer: Medicare HMO | Admitting: Family

## 2017-03-07 ENCOUNTER — Encounter: Payer: Self-pay | Admitting: Family

## 2017-03-07 ENCOUNTER — Ambulatory Visit (HOSPITAL_COMMUNITY)
Admission: RE | Admit: 2017-03-07 | Discharge: 2017-03-07 | Disposition: A | Discharge status: Home / Self Care | Payer: Medicare HMO | Source: Ambulatory Visit | Attending: Family | Admitting: Family

## 2017-03-07 VITALS — BP 118/65 | HR 56 | Temp 97.8°F | Resp 18 | Ht 68.0 in | Wt 147.0 lb

## 2017-03-07 DIAGNOSIS — I723 Aneurysm of iliac artery: Secondary | ICD-10-CM

## 2017-03-07 DIAGNOSIS — F17200 Nicotine dependence, unspecified, uncomplicated: Secondary | ICD-10-CM | POA: Diagnosis not present
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
## 2017-03-07 NOTE — Patient Instructions (Signed)
Abdominal Aortic Aneurysm Blood pumps away from the heart through tubes (blood vessels) called arteries. Aneurysms are weak or damaged places in the wall of an artery. It bulges out like a balloon. An abdominal aortic aneurysm happens in the main artery of the body (aorta). It can burst or tear, causing bleeding inside the body. This is an emergency. It needs treatment right away. What are the causes? The exact cause is unknown. Things that could cause this problem include:  Fat and other substances building up in the lining of a tube.  Swelling of the walls of a blood vessel.  Certain tissue diseases.  Belly (abdominal) trauma.  An infection in the main artery of the body.  What increases the risk? There are things that make it more likely for you to have an aneurysm. These include:  Being over the age of 68 years old.  Having high blood pressure (hypertension).  Being a male.  Being white.  Being very overweight (obese).  Having a family history of aneurysm.  Using tobacco products.  What are the signs or symptoms? Symptoms depend on the size of the aneurysm and how fast it grows. There may not be symptoms. If symptoms occur, they can include:  Pain (belly, side, lower back, or groin).  Feeling full after eating a small amount of food.  Feeling sick to your stomach (nauseous), throwing up (vomiting), or both.  Feeling a lump in your belly that feels like it is beating (pulsating).  Feeling like you will pass out (faint).  How is this treated?  Medicine to control blood pressure and pain.  Imaging tests to see if the aneurysm gets bigger.  Surgery. How is this prevented? To lessen your chance of getting this condition:  Stop smoking. Stop chewing tobacco.  Limit or avoid alcohol.  Keep your blood pressure, blood sugar, and cholesterol within normal limits.  Eat less salt.  Eat foods low in saturated fats and cholesterol. These are found in animal and  whole dairy products.  Eat more fiber. Fiber is found in whole grains, vegetables, and fruits.  Keep a healthy weight.  Stay active and exercise often.  This information is not intended to replace advice given to you by your health care provider. Make sure you discuss any questions you have with your health care provider. Document Released: 11/11/2012 Document Revised: 12/23/2015 Document Reviewed: 08/16/2012 Elsevier Interactive Patient Education  2017 Elsevier Inc.  

## 2017-03-16 NOTE — Addendum Note (Signed)
Addended by: Lianne Cure A on: 03/16/2017 03:41 PM   Modules accepted: Orders

## 2017-06-11 DIAGNOSIS — E785 Hyperlipidemia, unspecified: Secondary | ICD-10-CM | POA: Diagnosis not present

## 2017-06-11 DIAGNOSIS — I10 Essential (primary) hypertension: Secondary | ICD-10-CM | POA: Diagnosis not present

## 2017-06-11 DIAGNOSIS — Z23 Encounter for immunization: Secondary | ICD-10-CM | POA: Diagnosis not present

## 2017-06-11 DIAGNOSIS — K6289 Other specified diseases of anus and rectum: Secondary | ICD-10-CM | POA: Diagnosis not present

## 2017-08-16 ENCOUNTER — Other Ambulatory Visit: Payer: Self-pay | Admitting: Cardiology

## 2017-09-10 DIAGNOSIS — J069 Acute upper respiratory infection, unspecified: Secondary | ICD-10-CM | POA: Diagnosis not present

## 2017-09-10 DIAGNOSIS — E785 Hyperlipidemia, unspecified: Secondary | ICD-10-CM | POA: Diagnosis not present

## 2017-09-10 DIAGNOSIS — I10 Essential (primary) hypertension: Secondary | ICD-10-CM | POA: Diagnosis not present

## 2017-09-20 DIAGNOSIS — H524 Presbyopia: Secondary | ICD-10-CM | POA: Diagnosis not present

## 2017-09-20 DIAGNOSIS — Z0100 Encounter for examination of eyes and vision without abnormal findings: Secondary | ICD-10-CM | POA: Diagnosis not present

## 2017-12-17 DIAGNOSIS — I10 Essential (primary) hypertension: Secondary | ICD-10-CM | POA: Diagnosis not present

## 2017-12-17 DIAGNOSIS — E785 Hyperlipidemia, unspecified: Secondary | ICD-10-CM | POA: Diagnosis not present

## 2017-12-17 DIAGNOSIS — I2510 Atherosclerotic heart disease of native coronary artery without angina pectoris: Secondary | ICD-10-CM | POA: Diagnosis not present

## 2017-12-17 DIAGNOSIS — Z720 Tobacco use: Secondary | ICD-10-CM | POA: Diagnosis not present

## 2018-01-09 ENCOUNTER — Encounter (INDEPENDENT_AMBULATORY_CARE_PROVIDER_SITE_OTHER): Payer: Self-pay

## 2018-01-09 ENCOUNTER — Ambulatory Visit (INDEPENDENT_AMBULATORY_CARE_PROVIDER_SITE_OTHER): Payer: Medicare HMO | Admitting: Cardiology

## 2018-01-09 ENCOUNTER — Encounter: Payer: Self-pay | Admitting: Cardiology

## 2018-01-09 VITALS — BP 136/76 | HR 58 | Ht 68.0 in | Wt 153.8 lb

## 2018-01-09 DIAGNOSIS — R400 Somnolence: Secondary | ICD-10-CM | POA: Diagnosis not present

## 2018-01-09 DIAGNOSIS — F17200 Nicotine dependence, unspecified, uncomplicated: Secondary | ICD-10-CM

## 2018-01-09 DIAGNOSIS — I723 Aneurysm of iliac artery: Secondary | ICD-10-CM | POA: Diagnosis not present

## 2018-01-09 DIAGNOSIS — I2510 Atherosclerotic heart disease of native coronary artery without angina pectoris: Secondary | ICD-10-CM | POA: Diagnosis not present

## 2018-01-09 MED ORDER — CARVEDILOL 6.25 MG PO TABS: 6.2500 mg | ORAL_TABLET | Freq: Two times a day (BID) | ORAL | 3 refills | Status: DC

## 2018-01-09 NOTE — Progress Notes (Addendum)
Cardiology Office Note   Date:  01/09/2018   ID:  Ruben Young, DOB [birth year], MRN [id number]  PCP:  Iona Beard, MD  Cardiologist:  Dr. Candee Furbish     History of Present Illness: Ruben Young is a 67 y.o. male with a hx of CAD, s/p PCI to RCA in the past, anterior STEMI in 08/2012 tx with BMS to LAD, ischemic CM,  chronic systolic heart failure ,HTN, HL.   Walks 40 min a day, uphill at times, mild SOB. No CP.     2.63 cm left common iliac artery aneurysm, Dr. Scot Dock, normally consider elective repair with reaches 3.5 cm.  01/09/2018 -overall been doing quite well, mild shortness of breath.  Main complaint now is fatigue with daytime somnolence and trouble sleeping at night.  He says he wakes up many times during the night.  Would like refills on his medications.  No chest pain fevers chills nausea vomiting syncope.  Studies: - LHC (08/2012): Ant STEMI >>> mid LAD occluded, mid RCA stent ok. PCI: BMS to mid LAD.  - Echo (08/2012): Mild LVH, EF 40-45%, AS dyskinesis, Gr 1 DD.   Recent Labs: No results found for requested labs within last 8760 hours.    Recent Radiology: No results found.    Wt Readings from Last 3 Encounters:  01/09/18 153 lb 12.8 oz (69.8 kg)  03/07/17 147 lb (66.7 kg)  12/22/16 157 lb (71.2 kg)     Past Medical History:  Diagnosis Date  . DDD (degenerative disc disease), lumbar   . Hyperlipidemia   . Hypertension   . MI (myocardial infarction) ([place])   . Old myocardial infarction   . Tobacco use disorder     Current Outpatient Medications  Medication Sig Dispense Refill  . amLODipine (NORVASC) 5 MG tablet Take 1 tablet (5 mg total) by mouth daily. 90 tablet 3  . aspirin 81 MG tablet Take 1 tablet (81 mg total) by mouth daily.    Marland Kitchen atorvastatin (LIPITOR) 40 MG tablet Take 40 mg by mouth daily at 6 PM.     . carvedilol (COREG) 6.25 MG tablet Take 1 tablet (6.25 mg total) by mouth 2 (two) times daily with a meal. 180 tablet 3  .  Multiple Vitamin (MULTIVITAMIN WITH MINERALS) TABS tablet Take 1 tablet by mouth daily.    . nitroGLYCERIN (NITROSTAT) 0.4 MG SL tablet Place 0.4 mg under the tongue every 5 (five) minutes as needed. For chest pains    . oxymetazoline (AFRIN) 0.05 % nasal spray Place 1 spray into both nostrils 2 (two) times daily as needed for congestion.    . vitamin C (ASCORBIC ACID) 250 MG tablet Take 500 mg by mouth daily.    Marland Kitchen VITAMIN E PO Take 1 tablet by mouth daily.     No current facility-administered medications for this visit.      Allergies:   Patient has no known allergies.   Social History:  The patient  reports that he has been smoking cigarettes.  He has a 4.80 pack-year smoking history. He has never used smokeless tobacco. He reports that he does not drink alcohol or use drugs.   Family History:  The patient's family history includes Cancer in his mother; Heart attack in his mother.    ROS:  Please see the history of present illness. All other ROS neg  PHYSICAL EXAM: VS:  BP 136/76   Pulse (!) 58   Ht 5\' 8"  (1.727 m)  Wt 153 lb 12.8 oz (69.8 kg)   SpO2 95%   BMI 23.39 kg/m  GEN: Well nourished, well developed, in no acute distress  HEENT: normal  Neck: no JVD, carotid bruits, or masses Cardiac: RRR; no murmurs, rubs, or gallops,no edema  Respiratory:  clear to auscultation bilaterally, normal work of breathing GI: soft, nontender, nondistended, + BS MS: no deformity or atrophy  Skin: warm and dry, no rash Neuro:  Alert and Oriented x 3, Strength and sensation are intact Psych: euthymic mood, full affect     Echo  09/25/12-EF 40-45%  EKG : Today 01/09/2018-sinus bradycardia no other abnormalities.  Personally viewed-sinus bradycardia rate 57, NSSTW changes otherwise normal.  ASSESSMENT AND PLAN:   Hypertension: No change with medications.  Overall doing quite well.     Coronary Artery Disease:     -  Continue ASA , decreased 81 mg, statin, beta blocker.  No anginal  symptoms.  Continue current plan.  Ischemic Cardiomyopathy /chronic systolic heart failure  -class 1-2:  EF 40-45% in 2014.  He does not appear to be volume overloaded.  Continue with carvedilol.  Volume neutral.  Shortness of breath most likely related to smoking.   Hyperlipidemia:  Continue statin.   No results found for requested labs within last 8760 hours.  No changes made.  Last LDL 63.  Tobacco Abuse: We discussed again today.  Encourage tobacco cessation.  Iliac artery aneurysm -right common iliac. Followed by vascular , Dr. Scot Dock. 2.3 cm.  Day time somnolence - will check sleep study.   Disposition:   FU in 12 months    Signed, Candee Furbish, MD   01/09/2018 2:17 PM    [facility] [address], [place], [place]  [address] Phone: [phone];

## 2018-01-09 NOTE — Patient Instructions (Signed)
Medication Instructions:  The current medical regimen is effective;  continue present plan and medications.  Testing/Procedures: Your physician has recommended that you have a sleep study. This test records several body functions during sleep, including: brain activity, eye movement, oxygen and carbon dioxide blood levels, heart rate and rhythm, breathing rate and rhythm, the flow of air through your mouth and nose, snoring, body muscle movements, and chest and belly movement.  Follow-Up: Follow up in 1 year with Dr. Skains.  You will receive a letter in the mail 2 months before you are due.  Please call us when you receive this letter to schedule your follow up appointment.  If you need a refill on your cardiac medications before your next appointment, please call your pharmacy.  Thank you for choosing [facility]!!     

## 2018-01-10 ENCOUNTER — Telehealth: Payer: Self-pay | Admitting: *Deleted

## 2018-01-10 NOTE — Telephone Encounter (Signed)
-----   Message from Shellia Cleverly, RN sent at 01/09/2018  2:15 PM EDT ----- Pt needs split night study  Thanks  Pam

## 2018-01-10 NOTE — Telephone Encounter (Signed)
PA request submitted to [facility] for in lab split night sleep study.

## 2018-01-14 ENCOUNTER — Telehealth: Payer: Self-pay | Admitting: *Deleted

## 2018-01-14 ENCOUNTER — Encounter: Payer: Self-pay | Admitting: *Deleted

## 2018-01-14 NOTE — Telephone Encounter (Signed)
-----   Message from Shellia Cleverly, RN sent at 01/09/2018  2:15 PM EDT ----- Pt needs split night study  Thanks  Pam

## 2018-01-14 NOTE — Telephone Encounter (Signed)
Attempted to contact patient to give sleep study appointment. Unable to reach. Spoke with emergency contact and was told she is no longer his emergency contact and she has no information on him.

## 2018-01-14 NOTE — Telephone Encounter (Signed)
Unable to reach patient by telephone. Letter sent to patient's home address with sleep study appointment date. Also included sleep lab # to contact for details.

## 2018-01-16 ENCOUNTER — Other Ambulatory Visit: Payer: Self-pay | Admitting: Cardiology

## 2018-01-16 MED ORDER — CARVEDILOL 6.25 MG PO TABS: 6.2500 mg | ORAL_TABLET | Freq: Two times a day (BID) | ORAL | 3 refills | Status: DC

## 2018-01-16 NOTE — Telephone Encounter (Signed)
Pt's medication was resent to a mail order pharmacy. Confirmation received.  °

## 2018-02-04 ENCOUNTER — Ambulatory Visit: Payer: Medicare HMO | Attending: Cardiology | Admitting: Cardiovascular Disease

## 2018-02-04 DIAGNOSIS — G4736 Sleep related hypoventilation in conditions classified elsewhere: Secondary | ICD-10-CM | POA: Diagnosis not present

## 2018-02-04 DIAGNOSIS — Z79899 Other long term (current) drug therapy: Secondary | ICD-10-CM | POA: Insufficient documentation

## 2018-02-04 DIAGNOSIS — R0683 Snoring: Secondary | ICD-10-CM | POA: Insufficient documentation

## 2018-02-04 DIAGNOSIS — Z7982 Long term (current) use of aspirin: Secondary | ICD-10-CM | POA: Diagnosis not present

## 2018-02-04 DIAGNOSIS — R400 Somnolence: Secondary | ICD-10-CM | POA: Insufficient documentation

## 2018-02-04 DIAGNOSIS — G478 Other sleep disorders: Secondary | ICD-10-CM

## 2018-02-06 ENCOUNTER — Encounter: Payer: Self-pay | Admitting: Cardiovascular Disease

## 2018-02-06 NOTE — Procedures (Signed)
[facility]       Patient Name: Ruben Young, Ruben Young Date: 02/04/2018 Gender: Male D.O.B: [birth year] Age (years): 49 Referring Provider: Jerline Pain Height (inches): 68 Interpreting Physician: Shelva Majestic MD, ABSM Weight (lbs): 153 RPSGT: Rosebud Poles BMI: 23 MRN: [id number] Neck Size: 16.00  CLINICAL INFORMATION Sleep Study Type: NPSG  Indication for sleep study: Excessive Daytime Sleepiness  Epworth Sleepiness Score: 2  SLEEP STUDY TECHNIQUE As per the AASM Manual for the Scoring of Sleep and Associated Events v2.3 (April 2016) with a hypopnea requiring 4% desaturations.  The channels recorded and monitored were frontal, central and occipital EEG, electrooculogram (EOG), submentalis EMG (chin), nasal and oral airflow, thoracic and abdominal wall motion, anterior tibialis EMG, snore microphone, electrocardiogram, and pulse oximetry.  MEDICATIONS     amLODipine (NORVASC) 5 MG tablet         aspirin 81 MG tablet         atorvastatin (LIPITOR) 40 MG tablet         carvedilol (COREG) 6.25 MG tablet         Multiple Vitamin (MULTIVITAMIN WITH MINERALS) TABS tablet         nitroGLYCERIN (NITROSTAT) 0.4 MG SL tablet         oxymetazoline (AFRIN) 0.05 % nasal spray         vitamin C (ASCORBIC ACID) 250 MG tablet         VITAMIN E PO      Medications self-administered by patient taken the night of the study : N/A  SLEEP ARCHITECTURE The study was initiated at 10:25:40 PM and ended at 4:53:39 AM.  Sleep onset time was 27.7 minutes and the sleep efficiency was 51.2%%. The total sleep time was 198.7 minutes.  Wake after sleep onset (WASO) was 161.5 minutes.  Stage REM latency was 35.0 minutes.  The patient spent 9.8%% of the night in stage N1 sleep, 63.8%% in stage N2 sleep, 0.0%% in stage N3 and 26.42% in REM.  Alpha intrusion was absent.  Supine sleep was 38.75%.  RESPIRATORY PARAMETERS The overall apnea/hypopnea index (AHI) was 0.9 per hour.   The respiratory disturbance index (RDI) was 4.8 per hour. There were 2 total apneas, including 1 obstructive, 1 central and 0 mixed apneas. There were 1 hypopneas and 13 RERAs.  The AHI during Stage REM sleep was 0.0 per hour.  AHI while supine was 1.6 per hour.  The mean oxygen saturation was 91.5%. The minimum SpO2 during sleep was 89.0% during REM sleep.  Moderate snoring was noted during this study.  CARDIAC DATA The 2 lead EKG demonstrated sinus rhythm. The mean heart rate was N/A beats per minute. Other EKG findings include: PVCs.  LEG MOVEMENT DATA The total PLMS were 0 with a resulting PLMS index of 0.0. Associated arousal with leg movement index was 0.0 .  IMPRESSIONS - Very mild increased upper airway resistance syndrome (UARS) without significant obstructive sleep apnea (AHI 0.9/h; RDI 4.8/h). - No significant central sleep apnea occurred during this study (CAI = 0.3/h). - Mild oxygen desaturation to a nadir of 89% during REM sleep. - Reduced sleep efficiency at only 51.2%. - The patient snored with moderate snoring volume. - EKG findings include PVCs. - Clinically significant periodic limb movements did not occur during sleep. No significant associated arousals.  DIAGNOSIS - Nocturnal Hypoxemia (327.26 [G47.36 ICD-10]) - Snoring  RECOMMENDATIONS - Effort should be made to optimize nasal  and oropharyngeal patency. - At present there is no indication for CPAP therapy; consider alternatives for the treatment of moderate snoring. - Avoid alcohol, sedatives and other CNS depressants that may worsen sleep apnea and disrupt normal sleep architecture. - Sleep hygiene should be reviewed to assess factors that may improve sleep quality. - Regular exercise should be initiated or continued if appropriate.  [Electronically signed] 02/06/2018 02:33 PM  Shelva Majestic MD, [facility], ABSM Diplomate, American Board of Sleep Medicine   NPI: [id number] [place] PH: [phone]   FX: [phone] [place]

## 2018-02-07 ENCOUNTER — Telehealth: Payer: Self-pay | Admitting: *Deleted

## 2018-02-07 ENCOUNTER — Encounter (HOSPITAL_BASED_OUTPATIENT_CLINIC_OR_DEPARTMENT_OTHER): Payer: Medicare HMO

## 2018-02-07 NOTE — Telephone Encounter (Signed)
-----   Message from Troy Sine, MD sent at 02/06/2018  2:41 PM EDT ----- Mariann Laster, please notify pt of results;  Moderate snoring without significant OSA. No need for CPAP. Reduced sleep efficiency.

## 2018-02-07 NOTE — Telephone Encounter (Signed)
Called to discuss sleep study results. Received no answer or machine.

## 2018-02-08 NOTE — Telephone Encounter (Signed)
Patient notified of sleep study results. 

## 2018-02-11 NOTE — Progress Notes (Signed)
Patient notified

## 2018-04-04 ENCOUNTER — Ambulatory Visit: Payer: Medicare HMO | Admitting: Family

## 2018-04-04 ENCOUNTER — Encounter (HOSPITAL_COMMUNITY): Payer: Medicare HMO

## 2018-04-22 DIAGNOSIS — Z6823 Body mass index (BMI) 23.0-23.9, adult: Secondary | ICD-10-CM | POA: Diagnosis not present

## 2018-04-22 DIAGNOSIS — E785 Hyperlipidemia, unspecified: Secondary | ICD-10-CM | POA: Diagnosis not present

## 2018-04-22 DIAGNOSIS — I2510 Atherosclerotic heart disease of native coronary artery without angina pectoris: Secondary | ICD-10-CM | POA: Diagnosis not present

## 2018-04-22 DIAGNOSIS — Z125 Encounter for screening for malignant neoplasm of prostate: Secondary | ICD-10-CM | POA: Diagnosis not present

## 2018-04-22 DIAGNOSIS — I10 Essential (primary) hypertension: Secondary | ICD-10-CM | POA: Diagnosis not present

## 2018-04-22 DIAGNOSIS — Z720 Tobacco use: Secondary | ICD-10-CM | POA: Diagnosis not present

## 2018-04-26 ENCOUNTER — Ambulatory Visit (HOSPITAL_COMMUNITY)
Admission: RE | Admit: 2018-04-26 | Discharge: 2018-04-26 | Disposition: A | Discharge status: Home / Self Care | Payer: Medicare HMO | Source: Ambulatory Visit | Attending: Family | Admitting: Family

## 2018-04-26 ENCOUNTER — Other Ambulatory Visit: Payer: Self-pay

## 2018-04-26 ENCOUNTER — Ambulatory Visit (INDEPENDENT_AMBULATORY_CARE_PROVIDER_SITE_OTHER): Payer: Medicare HMO | Admitting: Family

## 2018-04-26 ENCOUNTER — Encounter: Payer: Self-pay | Admitting: Family

## 2018-04-26 VITALS — BP 129/73 | HR 57 | Temp 97.5°F | Resp 16 | Ht 68.0 in | Wt 156.0 lb

## 2018-04-26 DIAGNOSIS — I10 Essential (primary) hypertension: Secondary | ICD-10-CM | POA: Insufficient documentation

## 2018-04-26 DIAGNOSIS — I723 Aneurysm of iliac artery: Secondary | ICD-10-CM | POA: Insufficient documentation

## 2018-04-26 DIAGNOSIS — I252 Old myocardial infarction: Secondary | ICD-10-CM | POA: Diagnosis not present

## 2018-04-26 DIAGNOSIS — I2510 Atherosclerotic heart disease of native coronary artery without angina pectoris: Secondary | ICD-10-CM | POA: Diagnosis not present

## 2018-04-26 DIAGNOSIS — F17200 Nicotine dependence, unspecified, uncomplicated: Secondary | ICD-10-CM
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
## 2018-04-26 NOTE — Patient Instructions (Signed)
Steps to Quit Smoking Smoking tobacco can be bad for your health. It can also affect almost every organ in your body. Smoking puts you and people around you at risk for many serious long-lasting (chronic) diseases. Quitting smoking is hard, but it is one of the best things that you can do for your health. It is never too late to quit. What are the benefits of quitting smoking? When you quit smoking, you lower your risk for getting serious diseases and conditions. They can include:  Lung cancer or lung disease.  Heart disease.  Stroke.  Heart attack.  Not being able to have children (infertility).  Weak bones (osteoporosis) and broken bones (fractures).  If you have coughing, wheezing, and shortness of breath, those symptoms may get better when you quit. You may also get sick less often. If you are pregnant, quitting smoking can help to lower your chances of having a baby of low birth weight. What can I do to help me quit smoking? Talk with your doctor about what can help you quit smoking. Some things you can do (strategies) include:  Quitting smoking totally, instead of slowly cutting back how much you smoke over a period of time.  Going to in-person counseling. You are more likely to quit if you go to many counseling sessions.  Using resources and support systems, such as: ? Online chats with a counselor. ? Phone quitlines. ? Printed self-help materials. ? Support groups or group counseling. ? Text messaging programs. ? Mobile phone apps or applications.  Taking medicines. Some of these medicines may have nicotine in them. If you are pregnant or breastfeeding, do not take any medicines to quit smoking unless your doctor says it is okay. Talk with your doctor about counseling or other things that can help you.  Talk with your doctor about using more than one strategy at the same time, such as taking medicines while you are also going to in-person counseling. This can help make  quitting easier. What things can I do to make it easier to quit? Quitting smoking might feel very hard at first, but there is a lot that you can do to make it easier. Take these steps:  Talk to your family and friends. Ask them to support and encourage you.  Call phone quitlines, reach out to support groups, or work with a counselor.  Ask people who smoke to not smoke around you.  Avoid places that make you want (trigger) to smoke, such as: ? Bars. ? Parties. ? Smoke-break areas at work.  Spend time with people who do not smoke.  Lower the stress in your life. Stress can make you want to smoke. Try these things to help your stress: ? Getting regular exercise. ? Deep-breathing exercises. ? Yoga. ? Meditating. ? Doing a body scan. To do this, close your eyes, focus on one area of your body at a time from head to toe, and notice which parts of your body are tense. Try to relax the muscles in those areas.  Download or buy apps on your mobile phone or tablet that can help you stick to your quit plan. There are many free apps, such as QuitGuide from the CDC (Centers for Disease Control and Prevention). You can find more support from smokefree.gov and other websites.  This information is not intended to replace advice given to you by your health care provider. Make sure you discuss any questions you have with your health care provider. Document Released: 05/13/2009 Document   Revised: 03/14/2016 Document Reviewed: 12/01/2014 Elsevier Interactive Patient Education  2018 Elsevier Inc.  

## 2018-07-22 DIAGNOSIS — E785 Hyperlipidemia, unspecified: Secondary | ICD-10-CM | POA: Diagnosis not present

## 2018-07-22 DIAGNOSIS — I10 Essential (primary) hypertension: Secondary | ICD-10-CM | POA: Diagnosis not present

## 2018-07-22 DIAGNOSIS — I2510 Atherosclerotic heart disease of native coronary artery without angina pectoris: Secondary | ICD-10-CM | POA: Diagnosis not present

## 2018-07-22 DIAGNOSIS — Z6824 Body mass index (BMI) 24.0-24.9, adult: Secondary | ICD-10-CM | POA: Diagnosis not present

## 2018-10-14 ENCOUNTER — Encounter: Payer: Self-pay | Admitting: Gastroenterology

## 2018-10-24 ENCOUNTER — Telehealth: Payer: Self-pay | Admitting: Gastroenterology

## 2018-10-24 NOTE — Telephone Encounter (Signed)
Please schedule nurse visit

## 2018-10-24 NOTE — Telephone Encounter (Signed)
Pt LMOM that he received a letter that it's time to schedule his 3 year colonoscopy. Does he need an OV or NV prior to scheduling? [phone]

## 2018-10-25 ENCOUNTER — Encounter: Payer: Self-pay | Admitting: Gastroenterology

## 2018-10-25 NOTE — Telephone Encounter (Signed)
NV made and letter mailed 

## 2018-11-25 DIAGNOSIS — Z0000 Encounter for general adult medical examination without abnormal findings: Secondary | ICD-10-CM | POA: Diagnosis not present

## 2018-11-25 DIAGNOSIS — I10 Essential (primary) hypertension: Secondary | ICD-10-CM | POA: Diagnosis not present

## 2018-11-25 DIAGNOSIS — E785 Hyperlipidemia, unspecified: Secondary | ICD-10-CM | POA: Diagnosis not present

## 2018-11-25 DIAGNOSIS — N390 Urinary tract infection, site not specified: Secondary | ICD-10-CM | POA: Diagnosis not present

## 2018-11-25 DIAGNOSIS — Z720 Tobacco use: Secondary | ICD-10-CM | POA: Diagnosis not present

## 2018-12-18 ENCOUNTER — Encounter: Payer: Self-pay | Admitting: Gastroenterology

## 2019-01-02 ENCOUNTER — Ambulatory Visit: Payer: Medicare HMO

## 2019-01-08 ENCOUNTER — Telehealth: Payer: Self-pay | Admitting: Cardiology

## 2019-01-08 ENCOUNTER — Telehealth: Payer: Self-pay

## 2019-01-08 NOTE — Telephone Encounter (Signed)
YOUR CARDIOLOGY TEAM HAS ARRANGED FOR AN E-VISIT FOR YOUR APPOINTMENT - PLEASE REVIEW IMPORTANT INFORMATION BELOW SEVERAL DAYS PRIOR TO YOUR APPOINTMENT  Due to the recent COVID-19 pandemic, we are transitioning in-person office visits to tele-medicine visits in an effort to decrease unnecessary exposure to our patients, their families, and staff. These visits are billed to your insurance just like a normal visit is. We also encourage you to sign up for MyChart if you have not already done so. You will need a smartphone if possible. For patients that do not have this, we can still complete the visit using a regular telephone but do prefer a smartphone to enable video when possible. You may have a family member that lives with you that can help. If possible, we also ask that you have a blood pressure cuff and scale at home to measure your blood pressure, heart rate and weight prior to your scheduled appointment. Patients with clinical needs that need an in-person evaluation and testing will still be able to come to the office if absolutely necessary. If you have any questions, feel free to call our office.     YOUR PROVIDER WILL BE USING THE FOLLOWING PLATFORM TO COMPLETE YOUR VISIT: [email or web address]  . IF USING MYCHART - How to Download the MyChart App to Your SmartPhone   - If Apple, go to App Store and type in MyChart in the search bar and download the app. If Android, ask patient to go to Google Play Store and type in MyChart in the search bar and download the app. The app is free but as with any other app downloads, your phone may require you to verify saved payment information or Apple/Android password.  - You will need to then log into the app with your MyChart username and password, and select [facility] as your healthcare provider to link the account.  - When it is time for your visit, go to the MyChart app, find appointments, and click Begin Video Visit. Be sure to Select Allow for your device to  access the Microphone and Camera for your visit. You will then be connected, and your provider will be with you shortly.  **If you have any issues connecting or need assistance, please contact MyChart service desk [phone] ([phone])**  **If using a computer, in order to ensure the best quality for your visit, you will need to use either of the following Internet Browsers: Google Chrome or Microsoft Edge**  . IF USING DOXIMITY or [email or web address].ME - The staff will give you instructions on receiving your link to join the meeting the day of your visit.      2-3 DAYS BEFORE YOUR APPOINTMENT  You will receive a telephone call from one of our [facility] team members - your caller ID may say "Unknown caller." If this is a video visit, we will walk you through how to get the video launched on your phone. We will remind you check your blood pressure, heart rate and weight prior to your scheduled appointment. If you have an Apple Watch or Kardia, please upload any pertinent ECG strips the day before or morning of your appointment to MyChart. Our staff will also make sure you have reviewed the consent and agree to move forward with your scheduled tele-health visit.     THE DAY OF YOUR APPOINTMENT  Approximately 15 minutes prior to your scheduled appointment, you will receive a telephone call from one of HeartCare team - your caller ID may say "Unknown caller."    Our staff will confirm medications, vital signs for the day and any symptoms you may be experiencing. Please have this information available prior to the time of visit start. It may also be helpful for you to have a pad of paper and pen handy for any instructions given during your visit. They will also walk you through joining the smartphone meeting if this is a video visit.    CONSENT FOR TELE-HEALTH VISIT - PLEASE REVIEW  I hereby voluntarily request, consent and authorize [facility] and its employed or contracted physicians, physician  assistants, nurse practitioners or other licensed health care professionals (the Practitioner), to provide me with telemedicine health care services (the "Services") as deemed necessary by the treating Practitioner. I acknowledge and consent to receive the Services by the Practitioner via telemedicine. I understand that the telemedicine visit will involve communicating with the Practitioner through live audiovisual communication technology and the disclosure of certain medical information by electronic transmission. I acknowledge that I have been given the opportunity to request an in-person assessment or other available alternative prior to the telemedicine visit and am voluntarily participating in the telemedicine visit.  I understand that I have the right to withhold or withdraw my consent to the use of telemedicine in the course of my care at any time, without affecting my right to future care or treatment, and that the Practitioner or I may terminate the telemedicine visit at any time. I understand that I have the right to inspect all information obtained and/or recorded in the course of the telemedicine visit and may receive copies of available information for a reasonable fee.  I understand that some of the potential risks of receiving the Services via telemedicine include:  . Delay or interruption in medical evaluation due to technological equipment failure or disruption; . Information transmitted may not be sufficient (e.g. poor resolution of images) to allow for appropriate medical decision making by the Practitioner; and/or  . In rare instances, security protocols could fail, causing a breach of personal health information.  Furthermore, I acknowledge that it is my responsibility to provide information about my medical history, conditions and care that is complete and accurate to the best of my ability. I acknowledge that Practitioner's advice, recommendations, and/or decision may be based on  factors not within their control, such as incomplete or inaccurate data provided by me or distortions of diagnostic images or specimens that may result from electronic transmissions. I understand that the practice of medicine is not an exact science and that Practitioner makes no warranties or guarantees regarding treatment outcomes. I acknowledge that I will receive a copy of this consent concurrently upon execution via email to the email address I last provided but may also request a printed copy by calling the office of [facility].    I understand that my insurance will be billed for this visit.   I have read or had this consent read to me. . I understand the contents of this consent, which adequately explains the benefits and risks of the Services being provided via telemedicine.  . I have been provided ample opportunity to ask questions regarding this consent and the Services and have had my questions answered to my satisfaction. . I give my informed consent for the services to be provided through the use of telemedicine in my medical care  By participating in this telemedicine visit I agree to the above.  

## 2019-01-08 NOTE — Telephone Encounter (Signed)
Follow Up:    Returning your call, if not at this number, please call-[phone].

## 2019-01-09 ENCOUNTER — Encounter: Payer: Self-pay | Admitting: Cardiology

## 2019-01-09 ENCOUNTER — Telehealth (INDEPENDENT_AMBULATORY_CARE_PROVIDER_SITE_OTHER): Payer: Medicare HMO | Admitting: Cardiology

## 2019-01-09 ENCOUNTER — Other Ambulatory Visit: Payer: Self-pay

## 2019-01-09 VITALS — BP 140/80 | HR 74 | Ht 68.0 in | Wt 163.0 lb

## 2019-01-09 DIAGNOSIS — I723 Aneurysm of iliac artery: Secondary | ICD-10-CM

## 2019-01-09 DIAGNOSIS — F17200 Nicotine dependence, unspecified, uncomplicated: Secondary | ICD-10-CM

## 2019-01-09 DIAGNOSIS — I2510 Atherosclerotic heart disease of native coronary artery without angina pectoris: Secondary | ICD-10-CM | POA: Diagnosis not present

## 2019-01-09 NOTE — Patient Instructions (Signed)
Medication Instructions:  Your provider recommends that you continue on your current medications as directed. Please refer to the Current Medication list given to you today.    Labwork: None  Testing/Procedures: None  Follow-Up: Your provider wants you to follow-up in: 1 year with Dr. Marlou Porch. You will receive a reminder letter in the mail two months in advance. If you don't receive a letter, please call our office to schedule the follow-up appointment.    Any Other Special Instructions Will Be Listed Below (If Applicable).     If you need a refill on your cardiac medications before your next appointment, please call your pharmacy.

## 2019-01-09 NOTE — Progress Notes (Signed)
Virtual Visit via Telephone Note   This visit type was conducted due to national recommendations for restrictions regarding the COVID-19 Pandemic (e.g. social distancing) in an effort to limit this patient's exposure and mitigate transmission in our community.  Due to his co-morbid illnesses, this patient is at least at moderate risk for complications without adequate follow up.  This format is felt to be most appropriate for this patient at this time.  The patient did not have access to video technology/had technical difficulties with video requiring transitioning to audio format only (telephone).  All issues noted in this document were discussed and addressed.  No physical exam could be performed with this format.  Please refer to the patient's chart for his  consent to telehealth for [facility].   Date:  01/09/2019   ID:  Ruben Young, DOB Dec 18, 1950, MRN [id number]  Patient Location: Home Provider Location: Home  PCP:  Iona Beard, MD  Cardiologist:  Candee Furbish, MD  Electrophysiologist:  None   Evaluation Performed:  Follow-Up Visit  Chief Complaint: Cardiomyopathy follow-up  History of Present Illness:    Ruben Young is a 68 y.o. male with CAD, s/p PCI to RCA in the past, anterior STEMI in 08/2012 tx with BMS to LAD, ischemic CM,  chronic systolic heart failure ,HTN, HL.   Walks 40 min a day not as much now with COVID, uphill at times, mild SOB. No CP.     2.63 cm left common iliac artery aneurysm, Dr. Scot Dock, normally consider elective repair with reaches 3.5 cm.  01/09/2018 -overall been doing quite well, mild shortness of breath.  Main complaint now is fatigue with daytime somnolence and trouble sleeping at night.  He says he wakes up many times during the night.  Would like refills on his medications.  No chest pain fevers chills nausea vomiting syncope.  Studies: - LHC (08/2012): Ant STEMI >>> mid LAD occluded, mid RCA stent ok. PCI: BMS to mid LAD.   - Echo (08/2012): Mild LVH, EF 40-45%, AS dyskinesis, Gr 1 DD.  AAA Duplex (04/26/2018):  Previous size: (Date:03-07-17):AAA: 2.19cm, Right CIA: 2.02 cm; Left CIA: 1.63 cm   Current size:  Abdominal aorta: 2.7 cm (Date: 04-26-18); Right CIA: 2.3 cm; Left CIA: 1.9 cm  01/09/19 - doing quite well without any chest discomfort syncope bleeding orthopnea PND.  He does have some continued shortness of breath with activity.  Continues to smoke.  Walking quite as much at this time.  Compliant with his medications.  The patient does not have symptoms concerning for COVID-19 infection (fever, chills, cough, or new shortness of breath).    Past Medical History:  Diagnosis Date  . DDD (degenerative disc disease), lumbar   . Hyperlipidemia   . Hypertension   . MI (myocardial infarction) ([place])   . Old myocardial infarction   . Tobacco use disorder    Past Surgical History:  Procedure Laterality Date  . COLONOSCOPY N/A 10/01/2015   Procedure: COLONOSCOPY;  Surgeon: Danie Binder, MD;  Location: [facility];  Service: Endoscopy;  Laterality: N/A;  245  . CORONARY ANGIOPLASTY WITH STENT PLACEMENT    . EXCISION MASS LOWER EXTREMETIES Left 01/28/2016   Procedure: EXCISION LEFT BUTTOCKS SINUS TRACH;  Surgeon: Alphonsa Overall, MD;  Location: [facility]S;  Service: General;  Laterality: Left;  . LEFT HEART CATHETERIZATION WITH CORONARY ANGIOGRAM N/A 09/24/2012   Procedure: LEFT HEART CATHETERIZATION WITH CORONARY ANGIOGRAM;  Surgeon: Jettie Booze, MD;  Location: [facility]  CATH LAB;  Service: Cardiovascular;  Laterality: N/A;  . TONSILLECTOMY       Current Meds  Medication Sig  . amLODipine (NORVASC) 10 MG tablet Take 10 mg by mouth daily.  Marland Kitchen aspirin 81 MG tablet Take 1 tablet (81 mg total) by mouth daily.  Marland Kitchen atorvastatin (LIPITOR) 40 MG tablet Take 40 mg by mouth daily at 6 PM.   . carvedilol (COREG) 6.25 MG tablet Take 1 tablet (6.25 mg total) by mouth 2 (two) times daily with a meal.  . Multiple  Vitamin (MULTIVITAMIN WITH MINERALS) TABS tablet Take 1 tablet by mouth daily.  . nitroGLYCERIN (NITROSTAT) 0.4 MG SL tablet Place 0.4 mg under the tongue every 5 (five) minutes as needed. For chest pains  . oxymetazoline (AFRIN) 0.05 % nasal spray Place 1 spray into both nostrils 2 (two) times daily as needed for congestion.  . vitamin C (ASCORBIC ACID) 250 MG tablet Take 250 mg by mouth daily.  Marland Kitchen VITAMIN E PO Take 1 tablet by mouth daily.     Allergies:   Patient has no known allergies.   Social History   Tobacco Use  . Smoking status: Current Some Day Smoker    Packs/day: 0.10    Years: 48.00    Pack years: 4.80    Types: Cigarettes  . Smokeless tobacco: Never Used  . Tobacco comment: 1 pk lasts 3-4 days  Substance Use Topics  . Alcohol use: No    Alcohol/week: 0.0 standard drinks  . Drug use: No    Types: Cocaine    Comment: per patient has not used in 4 years     Family Hx: The patient's family history includes Cancer in his mother; Heart attack in his mother. There is no history of Colon cancer.  ROS:   Please see the history of present illness.    Denies any fevers chills nausea vomiting syncope bleeding All other systems reviewed and are negative.   Prior CV studies:   The following studies were reviewed today:  See below  Labs/Other Tests and Data Reviewed:    EKG:  An ECG dated 01/09/2018 was personally reviewed today and demonstrated:  Sinus bradycardia no other abnormalities  Recent Labs: No results found for requested labs within last 8760 hours.   Recent Lipid Panel Lab Results  Component Value Date/Time   CHOL 104 04/14/2014 03:51 PM   TRIG 58.0 04/14/2014 03:51 PM   HDL 29.70 (L) 04/14/2014 03:51 PM   CHOLHDL 4 04/14/2014 03:51 PM   LDLCALC 63 04/14/2014 03:51 PM    Wt Readings from Last 3 Encounters:  01/09/19 163 lb (73.9 kg)  04/26/18 156 lb (70.8 kg)  01/09/18 153 lb 12.8 oz (69.8 kg)     Objective:    Vital Signs:  BP 140/80    Pulse 74   Ht 5\' 8"  (1.727 m)   Wt 163 lb (73.9 kg)   BMI 24.78 kg/m    VITAL SIGNS:  reviewed pleasant, alert, able to complete full sentences without difficulty  ASSESSMENT & PLAN:    Coronary artery disease - Continue with aggressive secondary risk factor modification, aspirin 81 beta-blocker statin.  No anginal symptoms currently.  Fatigue, shortness of breath -Certainly could be multifactorial including cigarette use.  Continue to encourage exercise if the symptoms escalate, further testing may be warranted.  Ischemic cardiomyopathy - EF 40 to 45% in 2014.  Seems like he is maintaining his euvolemia.  Carvedilol.  Hyperlipidemia -Continue with statin therapy.  Last  LDL 63.  Tobacco use - Continue to encourage tobacco cessation.  Contributing to shortness of breath.  Iliac artery aneurysm -Right common iliac followed by V VS.  2.3 cm.  See above.   COVID-19 Education: The signs and symptoms of COVID-19 were discussed with the patient and how to seek care for testing (follow up with PCP or arrange E-visit).  The importance of social distancing was discussed today.  Time:   Today, I have spent 18 minutes with the patient with telehealth technology discussing the above problems.     Medication Adjustments/Labs and Tests Ordered: Current medicines are reviewed at length with the patient today.  Concerns regarding medicines are outlined above.   Tests Ordered: No orders of the defined types were placed in this encounter.   Medication Changes: No orders of the defined types were placed in this encounter.   Disposition:  Follow up in 1 year(s)  Signed, Candee Furbish, MD  01/09/2019 10:39 AM    [facility]

## 2019-01-30 ENCOUNTER — Other Ambulatory Visit: Payer: Self-pay | Admitting: Cardiology

## 2019-02-25 DIAGNOSIS — Z7189 Other specified counseling: Secondary | ICD-10-CM | POA: Diagnosis not present

## 2019-02-25 DIAGNOSIS — R5382 Chronic fatigue, unspecified: Secondary | ICD-10-CM | POA: Diagnosis not present

## 2019-02-25 DIAGNOSIS — Z720 Tobacco use: Secondary | ICD-10-CM | POA: Diagnosis not present

## 2019-02-25 DIAGNOSIS — I10 Essential (primary) hypertension: Secondary | ICD-10-CM | POA: Diagnosis not present

## 2019-03-10 ENCOUNTER — Ambulatory Visit (INDEPENDENT_AMBULATORY_CARE_PROVIDER_SITE_OTHER): Payer: Medicare Other | Admitting: *Deleted

## 2019-03-10 ENCOUNTER — Other Ambulatory Visit: Payer: Self-pay

## 2019-03-10 DIAGNOSIS — Z86010 Personal history of colonic polyps: Secondary | ICD-10-CM

## 2019-03-10 MED ORDER — NA SULFATE-K SULFATE-MG SULF 17.5-3.13-1.6 GM/177ML PO SOLN: 1.0000 | Freq: Once | ORAL | 0 refills | Status: AC

## 2019-03-10 NOTE — Patient Instructions (Signed)
Ruben Young  [birth year] MRN: [id number]     Procedure Date: 04/09/2019 Time to register: 11:00 am Place to register: [facility] Procedure Time: 12:00 pm Scheduled provider: Dr. Oneida Alar    PREPARATION FOR COLONOSCOPY WITH SUPREP BOWEL PREP KIT  Note: Suprep Bowel Prep Kit is a split-dose (2day) regimen. Consumption of BOTH 6-ounce bottles is required for a complete prep.  Please notify us immediately if you are diabetic, take iron supplements, or if you are on Coumadin or any other blood thinners.                                                                                                                                                  1 DAY BEFORE PROCEDURE:  DATE: 04/08/2019   DAY: Tuesday Continue clear liquids the entire day - NO SOLID FOOD.    At 6:00pm: Complete steps 1 through 4 below, using ONE (1) 6-ounce bottle, before going to bed. Step 1:  Pour ONE (1) 6-ounce bottle of SUPREP liquid into the mixing container.  Step 2:  Add cool drinking water to the 16 ounce line on the container and mix.  Note: Dilute the solution concentrate as directed prior to use. Step 3:  DRINK ALL the liquid in the container. Step 4:  You MUST drink an additional two (2) or more 16 ounce containers of water over the next one (1) hour.   Continue clear liquids.  DAY OF PROCEDURE:   DATE: 04/09/2019   DAY: Wednesday If you take medications for your heart, blood pressure, or breathing, you may take these medications.    5 hours before your procedure at :  7:00 am Step 1:  Pour ONE (1) 6-ounce bottle of SUPREP liquid into the mixing container.  Step 2:  Add cool drinking water to the 16 ounce line on the container and mix.  Note: Dilute the solution concentrate as directed prior to use. Step 3:  DRINK ALL the liquid in the container. Step 4:  You MUST drink an additional two (2) or more 16 ounce containers of water over the next one (1) hour. You MUST complete the final glass of  water at least 3 hours before your colonoscopy. Nothing by mouth past 9:00 am  You may take your morning medications with sip of water unless we have instructed otherwise.    Please see below for Dietary Information.  CLEAR LIQUIDS INCLUDE:  Water Jello (NOT red in color)   Ice Popsicles (NOT red in color)   Tea (sugar ok, no milk/cream) Powdered fruit flavored drinks  Coffee (sugar ok, no milk/cream) Gatorade/ Lemonade/ Kool-Aid  (NOT red in color)   Juice: apple, white grape, white cranberry Soft drinks  Clear bullion, consomme, broth (fat free beef/chicken/vegetable)  Carbonated beverages (any kind)  Strained chicken noodle soup Hard Candy   Remember:  Clear liquids are liquids that will allow you to see your fingers on the other side of a clear glass. Be sure liquids are NOT red in color, and not cloudy, but CLEAR.  DO NOT EAT OR DRINK ANY OF THE FOLLOWING:  Dairy products of any kind   Cranberry juice Tomato juice / V8 juice   Grapefruit juice Orange juice     Red grape juice  Do not eat any solid foods, including such foods as: cereal, oatmeal, yogurt, fruits, vegetables, creamed soups, eggs, bread, crackers, pureed foods in a blender, etc.   HELPFUL HINTS FOR DRINKING PREP SOLUTION:   Make sure prep is extremely cold. Mix and refrigerate the the morning of the prep. You may also put in the freezer.   You may try mixing some Crystal Light or Country Time Lemonade if you prefer. Mix in small amounts; add more if necessary.  Try drinking through a straw  Rinse mouth with water or a mouthwash between glasses, to remove after-taste.  Try sipping on a cold beverage /ice/ popsicles between glasses of prep.  Place a piece of sugar-free hard candy in mouth between glasses.  If you become nauseated, try consuming smaller amounts, or stretch out the time between glasses. Stop for 30-60 minutes, then slowly start back drinking.     OTHER INSTRUCTIONS  You will need a  responsible adult at least 70 years of age to accompany you and drive you home. This person must remain in the waiting room during your procedure. The hospital will cancel your procedure if you do not have a responsible adult with you.   1. Wear loose fitting clothing that is easily removed. 2. Leave jewelry and other valuables at home.  3. Remove all body piercing jewelry and leave at home. 4. Total time from sign-in until discharge is approximately 2-3 hours. 5. You should go home directly after your procedure and rest. You can resume normal activities the day after your procedure. 6. The day of your procedure you should not:  Drive  Make legal decisions  Operate machinery  Drink alcohol  Return to work   You may call the office (Dept: [phone]) before 5:00pm, or page the doctor on call [phone]) after 5:00pm, for further instructions, if necessary.   Insurance Information YOU WILL NEED TO CHECK WITH YOUR INSURANCE COMPANY FOR THE BENEFITS OF COVERAGE YOU HAVE FOR THIS PROCEDURE.  UNFORTUNATELY, NOT ALL INSURANCE COMPANIES HAVE BENEFITS TO COVER ALL OR PART OF THESE TYPES OF PROCEDURES.  IT IS YOUR RESPONSIBILITY TO CHECK YOUR BENEFITS, HOWEVER, WE WILL BE GLAD TO ASSIST YOU WITH ANY CODES YOUR INSURANCE COMPANY MAY NEED.    PLEASE NOTE THAT MOST INSURANCE COMPANIES WILL NOT COVER A SCREENING COLONOSCOPY FOR PEOPLE UNDER THE AGE OF 70  IF YOU HAVE BCBS INSURANCE, YOU MAY HAVE BENEFITS FOR A SCREENING COLONOSCOPY BUT IF POLYPS ARE FOUND THE DIAGNOSIS WILL CHANGE AND THEN YOU MAY HAVE A DEDUCTIBLE THAT WILL NEED TO BE MET. SO PLEASE MAKE SURE YOU CHECK YOUR BENEFITS FOR A SCREENING COLONOSCOPY AS WELL AS A DIAGNOSTIC COLONOSCOPY.

## 2019-03-10 NOTE — Progress Notes (Signed)
Gastroenterology Pre-Procedure Review  Request Date: 03/10/2019 Requesting Physician: 3 year recall, Last TCS done 10/01/15 by Dr. Oneida Alar, tubular adenoma  PATIENT REVIEW QUESTIONS: The patient responded to the following health history questions as indicated:    1. Diabetes Melitis: No 2. Joint replacements in the past 12 months: No 3. Major health problems in the past 3 months: No, but diagnosed with aneurysm in groin area 4 years ago 4. Has an artificial valve or MVP: No 5. Has a defibrillator: No 6. Has been advised in past to take antibiotics in advance of a procedure like teeth cleaning: No 7. Family history of colon cancer: No  8. Alcohol Use: No 9. History of sleep apnea: No  10. History of coronary artery or other vascular stents placed within the last 12 months: No, but had stents placed 12 years ago and 4 years ago 11. History of any prior anesthesia complications: No    MEDICATIONS & ALLERGIES:    Patient reports the following regarding taking any blood thinners:   Plavix? No Aspirin? Yes Coumadin? No Brilinta? No Xarelto? No Eliquis? No Pradaxa? No Savaysa? No Effient? No  Patient confirms/reports the following medications:  Current Outpatient Medications  Medication Sig Dispense Refill  . amLODipine (NORVASC) 10 MG tablet Take 10 mg by mouth daily.    Marland Kitchen aspirin 81 MG tablet Take 1 tablet (81 mg total) by mouth daily.    Marland Kitchen atorvastatin (LIPITOR) 40 MG tablet Take 40 mg by mouth daily at 6 PM.     . carvedilol (COREG) 6.25 MG tablet TAKE 1 TABLET TWICE DAILY WITH MEALS 180 tablet 3  . Chlorpheniramine-DM (CORICIDIN HBP COUGH/COLD PO) Take by mouth daily.    . Cholecalciferol (VITAMIN D3) 25 MCG (1000 UT) CAPS Take by mouth daily.    . Multiple Vitamin (MULTIVITAMIN WITH MINERALS) TABS tablet Take 1 tablet by mouth daily.    . Multiple Vitamins-Minerals (ANTIOXIDANT) CAPS Take by mouth daily.    . nitroGLYCERIN (NITROSTAT) 0.4 MG SL tablet Place 0.4 mg under the  tongue every 5 (five) minutes as needed. For chest pains    . Omega-3 Fatty Acids (FISH OIL) 1000 MG CAPS Take by mouth daily.    Marland Kitchen oxymetazoline (AFRIN) 0.05 % nasal spray Place 1 spray into both nostrils 2 (two) times daily as needed for congestion.    Marland Kitchen UNABLE TO FIND daily. Healthy Eye Care vitamin    . vitamin B-12 (CYANOCOBALAMIN) 1000 MCG tablet Take 1,000 mcg by mouth daily.    . vitamin C (ASCORBIC ACID) 250 MG tablet Take 500 mg by mouth daily.     Marland Kitchen VITAMIN E PO Take 1 tablet by mouth daily. Takes 180 mg daily     No current facility-administered medications for this visit.     Patient confirms/reports the following allergies:  No Known Allergies  No orders of the defined types were placed in this encounter.   AUTHORIZATION INFORMATION Primary Insurance: [employer] Medicare,  ID #: [id number],  Group #: [id number] Pre-Cert / Auth required: Not required  Secondary Insurance: Medicaid [place],  ID #: [id number] P Pre-Cert / Josem Kaufmann required: Not required  SCHEDULE INFORMATION: Procedure has been scheduled as follows:  Date: 04/09/2019, Time: 12:00 Location: [facility] with Dr. Oneida Alar  This Gastroenterology Pre-Precedure Review Form is being routed to the following provider(s): Walden Field, NP

## 2019-03-13 ENCOUNTER — Telehealth: Payer: Self-pay | Admitting: Gastroenterology

## 2019-03-13 NOTE — Progress Notes (Signed)
Ok to schedule.

## 2019-03-13 NOTE — Telephone Encounter (Signed)
Pt was returning a call from [facility]. [phone]

## 2019-03-13 NOTE — Telephone Encounter (Signed)
Lmom for pt to call me back. 

## 2019-03-14 NOTE — Addendum Note (Signed)
Addended by: Metro Kung on: 03/14/2019 07:34 AM   Modules accepted: Orders, SmartSet

## 2019-03-17 NOTE — Telephone Encounter (Signed)
Tried to call pt again but vm was not set up.

## 2019-03-19 NOTE — Telephone Encounter (Signed)
Tried to call pt again but vm not set up.

## 2019-03-19 NOTE — Telephone Encounter (Signed)
Tried to reach pt again but no vm.  Called sister and left message for pt to call us back.  Pt needs to have his Covid screening on 04/08/2019.

## 2019-03-20 NOTE — Telephone Encounter (Signed)
Pt called back and is aware to have his Covid screening on 04/08/2019 at 8:25 am.  Pt voiced understanding.

## 2019-04-07 ENCOUNTER — Inpatient Hospital Stay (HOSPITAL_COMMUNITY): Admission: RE | Admit: 2019-04-07 | Payer: Medicare Other | Source: Ambulatory Visit

## 2019-04-08 ENCOUNTER — Other Ambulatory Visit (HOSPITAL_COMMUNITY)
Admission: RE | Admit: 2019-04-08 | Discharge: 2019-04-08 | Disposition: A | Discharge status: Home / Self Care | Payer: Medicare Other | Source: Ambulatory Visit | Attending: Gastroenterology | Admitting: Gastroenterology

## 2019-04-08 ENCOUNTER — Other Ambulatory Visit: Payer: Self-pay

## 2019-04-08 DIAGNOSIS — Z20828 Contact with and (suspected) exposure to other viral communicable diseases: Secondary | ICD-10-CM | POA: Diagnosis not present

## 2019-04-08 DIAGNOSIS — Z01812 Encounter for preprocedural laboratory examination: Secondary | ICD-10-CM | POA: Insufficient documentation

## 2019-04-09 ENCOUNTER — Encounter (HOSPITAL_COMMUNITY): Payer: Self-pay | Admitting: *Deleted

## 2019-04-09 ENCOUNTER — Ambulatory Visit (HOSPITAL_COMMUNITY)
Admission: RE | Admit: 2019-04-09 | Discharge: 2019-04-09 | Disposition: A | Discharge status: Home / Self Care | Payer: Medicare Other | Attending: Gastroenterology | Admitting: Gastroenterology

## 2019-04-09 ENCOUNTER — Encounter (HOSPITAL_COMMUNITY)
Admission: RE | Disposition: A | Discharge status: Home / Self Care | Payer: Self-pay | Source: Home / Self Care | Attending: Gastroenterology

## 2019-04-09 ENCOUNTER — Other Ambulatory Visit: Payer: Self-pay

## 2019-04-09 DIAGNOSIS — D122 Benign neoplasm of ascending colon: Secondary | ICD-10-CM | POA: Diagnosis not present

## 2019-04-09 DIAGNOSIS — K635 Polyp of colon: Secondary | ICD-10-CM

## 2019-04-09 DIAGNOSIS — I10 Essential (primary) hypertension: Secondary | ICD-10-CM | POA: Diagnosis not present

## 2019-04-09 DIAGNOSIS — Z09 Encounter for follow-up examination after completed treatment for conditions other than malignant neoplasm: Secondary | ICD-10-CM | POA: Insufficient documentation

## 2019-04-09 DIAGNOSIS — Z8719 Personal history of other diseases of the digestive system: Secondary | ICD-10-CM | POA: Diagnosis not present

## 2019-04-09 DIAGNOSIS — E785 Hyperlipidemia, unspecified: Secondary | ICD-10-CM | POA: Insufficient documentation

## 2019-04-09 DIAGNOSIS — Z860100 Personal history of colon polyps, unspecified: Secondary | ICD-10-CM

## 2019-04-09 DIAGNOSIS — Z955 Presence of coronary angioplasty implant and graft: Secondary | ICD-10-CM | POA: Diagnosis not present

## 2019-04-09 DIAGNOSIS — Q439 Congenital malformation of intestine, unspecified: Secondary | ICD-10-CM | POA: Diagnosis not present

## 2019-04-09 DIAGNOSIS — Z79899 Other long term (current) drug therapy: Secondary | ICD-10-CM | POA: Insufficient documentation

## 2019-04-09 DIAGNOSIS — D123 Benign neoplasm of transverse colon: Secondary | ICD-10-CM | POA: Diagnosis not present

## 2019-04-09 DIAGNOSIS — I252 Old myocardial infarction: Secondary | ICD-10-CM | POA: Insufficient documentation

## 2019-04-09 DIAGNOSIS — Z7982 Long term (current) use of aspirin: Secondary | ICD-10-CM | POA: Diagnosis not present

## 2019-04-09 DIAGNOSIS — K644 Residual hemorrhoidal skin tags: Secondary | ICD-10-CM | POA: Diagnosis not present

## 2019-04-09 DIAGNOSIS — F17210 Nicotine dependence, cigarettes, uncomplicated: Secondary | ICD-10-CM | POA: Diagnosis not present

## 2019-04-09 DIAGNOSIS — D120 Benign neoplasm of cecum: Secondary | ICD-10-CM | POA: Insufficient documentation

## 2019-04-09 DIAGNOSIS — K648 Other hemorrhoids: Secondary | ICD-10-CM | POA: Insufficient documentation

## 2019-04-09 DIAGNOSIS — K5730 Diverticulosis of large intestine without perforation or abscess without bleeding: Secondary | ICD-10-CM | POA: Diagnosis not present

## 2019-04-09 DIAGNOSIS — D125 Benign neoplasm of sigmoid colon: Secondary | ICD-10-CM | POA: Insufficient documentation

## 2019-04-09 HISTORY — PX: POLYPECTOMY: SHX5525

## 2019-04-09 HISTORY — PX: COLONOSCOPY: SHX5424

## 2019-04-09 LAB — SARS CORONAVIRUS 2 (TAT 6-24 HRS): SARS Coronavirus 2: NEGATIVE

## 2019-04-09 SURGERY — COLONOSCOPY
Anesthesia: Moderate Sedation

## 2019-04-09 MED ORDER — MEPERIDINE HCL 100 MG/ML IJ SOLN
INTRAMUSCULAR | Status: AC
  Filled 2019-04-09: qty 1

## 2019-04-09 MED ORDER — MIDAZOLAM HCL 5 MG/5ML IJ SOLN
INTRAMUSCULAR | Status: DC | PRN
  Administered 2019-04-09: 1 mg via INTRAVENOUS
  Administered 2019-04-09 (×2): 2 mg via INTRAVENOUS

## 2019-04-09 MED ORDER — MIDAZOLAM HCL 5 MG/5ML IJ SOLN
INTRAMUSCULAR | Status: AC
  Filled 2019-04-09: qty 5

## 2019-04-09 MED ORDER — MEPERIDINE HCL 100 MG/ML IJ SOLN
INTRAMUSCULAR | Status: DC | PRN
  Administered 2019-04-09 (×3): 25 mg

## 2019-04-09 MED ORDER — SODIUM CHLORIDE 0.9 % IV SOLN
INTRAVENOUS | Status: DC
  Administered 2019-04-09: 11:00:00 via INTRAVENOUS

## 2019-04-09 NOTE — H&P (Signed)
Primary Care Physician:  Iona Beard, MD Primary Gastroenterologist:  Dr. Oneida Alar  Pre-Procedure History & Physical: HPI:  Ruben Young is a 68 y.o. male here for  PERSONAL HISTORY OF POLYPS.  Past Medical History:  Diagnosis Date  . DDD (degenerative disc disease), lumbar   . Hyperlipidemia   . Hypertension   . MI (myocardial infarction) ([place])   . Old myocardial infarction   . Tobacco use disorder     Past Surgical History:  Procedure Laterality Date  . COLONOSCOPY N/A 10/01/2015   Procedure: COLONOSCOPY;  Surgeon: Danie Binder, MD;  [facility];  Service: Endoscopy;  Laterality: N/A;  245  . CORONARY ANGIOPLASTY WITH STENT PLACEMENT    . EXCISION MASS LOWER EXTREMETIES Left 01/28/2016   Procedure: EXCISION LEFT BUTTOCKS SINUS TRACH;  Surgeon: Alphonsa Overall, MD;  Location: [facility];  Service: General;  Laterality: Left;  . LEFT HEART CATHETERIZATION WITH CORONARY ANGIOGRAM N/A 09/24/2012   Procedure: LEFT HEART CATHETERIZATION WITH CORONARY ANGIOGRAM;  Surgeon: Jettie Booze, MD;  Location[facility] CATH LAB;  Service: Cardiovascular;  Laterality: N/A;  . TONSILLECTOMY      Prior to Admission medications   Medication Sig Start Date End Date Taking? Authorizing Provider  amLODipine (NORVASC) 10 MG tablet Take 10 mg by mouth daily.   Yes [provider]  aspirin EC 81 MG tablet Take 81 mg by mouth daily.   Yes [provider]  atorvastatin (LIPITOR) 40 MG tablet Take 40 mg by mouth daily at 6 PM.  09/03/15  Yes [provider]  carvedilol (COREG) 6.25 MG tablet TAKE 1 TABLET TWICE DAILY WITH MEALS Patient taking differently: Take 6.25 mg by mouth 2 (two) times daily.  01/30/19  Yes Jerline Pain, MD  Chlorpheniramine-DM (CORICIDIN HBP COUGH/COLD PO) Take 1 tablet by mouth daily.    Yes [provider]  Cholecalciferol (VITAMIN D3) 25 MCG (1000 UT) CAPS Take 1,000 Units by mouth daily.    Yes [provider]  Multiple Vitamin  (MULTIVITAMIN WITH MINERALS) TABS tablet Take 1 tablet by mouth daily.   Yes [provider]  Multiple Vitamins-Minerals (ANTIOXIDANT) CAPS Take 1 capsule by mouth daily.    Yes [provider]  Multiple Vitamins-Minerals (EYE VITAMINS PO) Take 1 tablet by mouth daily.   Yes [provider]  nitroGLYCERIN (NITROSTAT) 0.4 MG SL tablet Place 0.4 mg under the tongue every 5 (five) minutes x 3 doses as needed for chest pain. For chest pains    Yes [provider]  Omega-3 Fatty Acids (FISH OIL) 1000 MG CAPS Take 1,000 mg by mouth daily.    Yes [provider]  oxymetazoline (AFRIN) 0.05 % nasal spray Place 1 spray into both nostrils 2 (two) times daily as needed for congestion.   Yes [provider]  vitamin C (ASCORBIC ACID) 500 MG tablet Take 500 mg by mouth daily.   Yes [provider]  vitamin E 400 UNIT capsule Take 400 Units by mouth daily.   Yes [provider]    Allergies as of 03/14/2019  . (No Known Allergies)    Family History  Problem Relation Age of Onset  . Cancer Mother   . Heart attack Mother   . Colon cancer Neg Hx     Social History   Socioeconomic History  . Marital status: Single    Spouse name: Not on file  . Number of children: Not on file  . Years of education:  Not on file  . Highest education level: Not on file  Occupational History  . Not on file  Social Needs  . Financial resource strain: Not on file  . Food insecurity    Worry: Not on file    Inability: Not on file  . Transportation needs    Medical: Not on file    Non-medical: Not on file  Tobacco Use  . Smoking status: Current Every Day Smoker    Packs/day: 0.50    Years: 48.00    Pack years: 24.00    Types: Cigarettes  . Smokeless tobacco: Never Used  . Tobacco comment: 1 pk lasts 3-4 days  Substance and Sexual Activity  . Alcohol use: No    Alcohol/week: 0.0 standard drinks  . Drug use: No    Types: Cocaine     Comment: per patient has not used in 4 years  . Sexual activity: Not on file  Lifestyle  . Physical activity    Days per week: Not on file    Minutes per session: Not on file  . Stress: Not on file  Relationships  . Social Herbalist on phone: Not on file    Gets together: Not on file    Attends religious service: Not on file    Active member of club or organization: Not on file    Attends meetings of clubs or organizations: Not on file    Relationship status: Not on file  . Intimate partner violence    Fear of current or ex partner: Not on file    Emotionally abused: Not on file    Physically abused: Not on file    Forced sexual activity: Not on file  Other Topics Concern  . Not on file  Social History Narrative  . Not on file    Review of Systems: See HPI, otherwise negative ROS   Physical Exam: BP 114/83   Pulse (!) 58   Temp 97.9 F (36.6 C) (Oral)   Resp 17   Ht 5\' 9"  (1.753 m)   Wt 73.9 kg   SpO2 95%   BMI 24.07 kg/m  General:   Alert,  pleasant and cooperative in NAD Head:  Normocephalic and atraumatic. Neck:  Supple; Lungs:  Clear throughout to auscultation.    Heart:  Regular rate and rhythm. Abdomen:  Soft, nontender and nondistended. Normal bowel sounds, without guarding, and without rebound.   Neurologic:  Alert and  oriented x4;  grossly normal neurologically.  Impression/Plan:      PERSONAL HISTORY OF POLYPS.  PLAN: 1. TCS TODAY. DISCUSSED PROCEDURE, BENEFITS, & RISKS: < 1% chance of medication reaction, bleeding, perforation, ASPIRATION, or rupture of spleen/liver requiring surgery to fix it and missed polyps < 1 cm 10-20% of the time.

## 2019-04-09 NOTE — Op Note (Signed)
[facility] Patient Name: Ruben Young Procedure Date: 04/09/2019 11:49 AM MRN: [id number] Date of Birth: 1950/10/24 Attending MD: Barney Drain MD, MD CSN: [id number] Age: 68 Admit Type: Outpatient Procedure:                Colonoscopy WITH COLD SNARE POLYPECTOMY Indications:              Personal history of colonic polyps Providers:                Barney Drain MD, MD, Janeece Riggers, RN, Aram Candela Referring MD:             Barrie Folk. Hill MD, MD Medicines:                Meperidine 75 mg IV, Midazolam 5 mg IV Complications:            No immediate complications. Estimated Blood Loss:     Estimated blood loss was minimal. Procedure:                Pre-Anesthesia Assessment:                           - Prior to the procedure, a History and Physical                            was performed, and patient medications and                            allergies were reviewed. The patient's tolerance of                            previous anesthesia was also reviewed. The risks                            and benefits of the procedure and the sedation                            options and risks were discussed with the patient.                            All questions were answered, and informed consent                            was obtained. Prior Anticoagulants: The patient has                            taken no previous anticoagulant or antiplatelet                            agents except for aspirin. ASA Grade Assessment: II                            - A patient with mild systemic disease. After                            reviewing the risks and benefits, the patient was  deemed in satisfactory condition to undergo the                            procedure. After obtaining informed consent, the                            colonoscope was passed under direct vision.                            Throughout the procedure, the patient's blood         pressure, pulse, and oxygen saturations were                            monitored continuously. The [id number]                            scope was introduced through the anus and advanced                            to the the cecum, identified by appendiceal orifice                            and ileocecal valve. The colonoscopy was somewhat                            difficult due to a tortuous colon. Successful                            completion of the procedure was aided by                            straightening and shortening the scope to obtain                            bowel loop reduction and COLOWRAP. The patient                            tolerated the procedure well. The quality of the                            bowel preparation was good. The ileocecal valve,                            appendiceal orifice, and rectum were photographed. Scope In: [id number] PM Scope Out: 12:35:35 PM Scope Withdrawal Time: 0 hours 17 minutes 39 seconds  Total Procedure Duration: 0 hours 19 minutes 18 seconds  Findings:      Six sessile polyps were found in the sigmoid colon, splenic flexure(2),       hepatic flexure, ascending colon and cecum. The polyps were 2 to 6 mm in       size. These polyps were removed with a cold snare. Resection and       retrieval were complete.      Multiple small and large-mouthed diverticula were found in the  recto-sigmoid colon and sigmoid colon.      External and internal hemorrhoids were found.      The recto-sigmoid colon and sigmoid colon were mildly tortuous. Impression:               - Six 2 to 6 mm polyps in the sigmoid colon, at the                            splenic flexure, at the hepatic flexure, in the                            ascending colon and in the cecum, removed with a                            cold snare. Resected and retrieved.                           - Diverticulosis in the recto-sigmoid colon and in                             the sigmoid colon.                           - External and internal hemorrhoids.                           - Tortuous colon. Moderate Sedation:      Moderate (conscious) sedation was administered by the endoscopy nurse       and supervised by the endoscopist. The following parameters were       monitored: oxygen saturation, heart rate, blood pressure, and response       to care. Total physician intraservice time was 38 minutes. Recommendation:           - Patient has a contact number available for                            emergencies. The signs and symptoms of potential                            delayed complications were discussed with the                            patient. Return to normal activities tomorrow.                            Written discharge instructions were provided to the                            patient.                           - High fiber diet.                           - Continue present medications.                           -  Await pathology results.                           - Repeat colonoscopy in 3 years for surveillance. Procedure Code(s):        --- Professional ---                           [phone], Colonoscopy, flexible; with removal of                            tumor(s), polyp(s), or other lesion(s) by snare                            technique                           99153, Moderate sedation; each additional 15                            minutes intraservice time                           99153, Moderate sedation; each additional 15                            minutes intraservice time                           G0500, Moderate sedation services provided by the                            same physician or other qualified health care                            professional performing a gastrointestinal                            endoscopic service that sedation supports,                            requiring the presence of an  independent trained                            observer to assist in the monitoring of the                            patient's level of consciousness and physiological                            status; initial 15 minutes of intra-service time;                            patient age 107 years or older (additional time may                            be reported with  99153, as appropriate) Diagnosis Code(s):        --- Professional ---                           K63.5, Polyp of colon                           K64.8, Other hemorrhoids                           Z86.010, Personal history of colonic polyps                           K57.30, Diverticulosis of large intestine without                            perforation or abscess without bleeding                           Q43.8, Other specified congenital malformations of                            intestine CPT copyright 2019 [employer]. All rights reserved. The codes documented in this report are preliminary and upon coder review may  be revised to meet current compliance requirements. Barney Drain, MD Barney Drain MD, MD 04/09/2019 12:45:55 PM This report has been signed electronically. Number of Addenda: 0

## 2019-04-09 NOTE — Discharge Instructions (Signed)
You have internal AND EXTERNAL hemorrhoids and diverticulosis IN YOUR LEFT COLON. YOU HAD SIX POLYPS REMOVED.     STOP SMOKING. IT GENERATES POLYPS.  DRINK WATER TO KEEP YOUR URINE LIGHT YELLOW.  FOLLOW A HIGH FIBER DIET. AVOID ITEMS THAT CAUSE BLOATING. See info below.   USE PREPARATION H FOUR TIMES  A DAY IF NEEDED TO RELIEVE RECTAL PAIN/PRESSURE/BLEEDING.   YOUR BIOPSY RESULTS WILL BE BACK IN 5 BUSINESS DAYS.  Next colonoscopy in 3 years.  Colonoscopy Care After Read the instructions outlined below and refer to this sheet in the next week. These discharge instructions provide you with general information on caring for yourself after you leave the hospital. While your treatment has been planned according to the most current medical practices available, unavoidable complications occasionally occur. If you have any problems or questions after discharge, call DR. Avian Konigsberg, [phone].  ACTIVITY  You may resume your regular activity, but move at a slower pace for the next 24 hours.   Take frequent rest periods for the next 24 hours.   Walking will help get rid of the air and reduce the bloated feeling in your belly (abdomen).   No driving for 24 hours (because of the medicine (anesthesia) used during the test).   You may shower.   Do not sign any important legal documents or operate any machinery for 24 hours (because of the anesthesia used during the test).    NUTRITION  Drink plenty of fluids.   You may resume your normal diet as instructed by your doctor.   Begin with a light meal and progress to your normal diet. Heavy or fried foods are harder to digest and may make you feel sick to your stomach (nauseated).   Avoid alcoholic beverages for 24 hours or as instructed.    MEDICATIONS  You may resume your normal medications.   WHAT YOU CAN EXPECT TODAY  Some feelings of bloating in the abdomen.   Passage of more gas than usual.   Spotting of blood in your  stool or on the toilet paper  .  IF YOU HAD POLYPS REMOVED DURING THE COLONOSCOPY:  Eat a soft diet IF YOU HAVE NAUSEA, BLOATING, ABDOMINAL PAIN, OR VOMITING.    FINDING OUT THE RESULTS OF YOUR TEST Not all test results are available during your visit. DR. Oneida Alar WILL CALL YOU WITHIN 14 DAYS OF YOUR PROCEDUE WITH YOUR RESULTS. Do not assume everything is normal if you have not heard from DR. Oneal Biglow, CALL HER OFFICE AT [phone].  SEEK IMMEDIATE MEDICAL ATTENTION AND CALL THE OFFICE: [phone] IF:  You have more than a spotting of blood in your stool.   Your belly is swollen (abdominal distention).   You are nauseated or vomiting.   You have a temperature over 101F.   You have abdominal pain or discomfort that is severe or gets worse throughout the day. High-Fiber Diet A high-fiber diet changes your normal diet to include more whole grains, legumes, fruits, and vegetables. Changes in the diet involve replacing refined carbohydrates with unrefined foods. The calorie level of the diet is essentially unchanged. The Dietary Reference Intake (recommended amount) for adult males is 38 grams per day. For adult females, it is 25 grams per day. Pregnant and lactating women should consume 28 grams of fiber per day. Fiber is the intact part of a plant that is not broken down during digestion. Functional fiber is fiber that has been isolated from the plant to provide a beneficial effect  in the body.  PURPOSE  Increase stool bulk.   Ease and regulate bowel movements.   Lower cholesterol.   REDUCE RISK OF COLON CANCER  INDICATIONS THAT YOU NEED MORE FIBER  Constipation and hemorrhoids.   Uncomplicated diverticulosis (intestine condition) and irritable bowel syndrome.   Weight management.   As a protective measure against hardening of the arteries (atherosclerosis), diabetes, and cancer.   GUIDELINES FOR INCREASING FIBER IN THE DIET  Start adding fiber to the diet slowly. A  gradual increase of about 5 more grams (2 servings of most fruits or vegetables) per day is best. Too rapid an increase in fiber may result in constipation, flatulence, and bloating.   Drink enough water and fluids to keep your urine clear or pale yellow. Water, juice, or caffeine-free drinks are recommended. Not drinking enough fluid may cause constipation.   Eat a variety of high-fiber foods rather than one type of fiber.   Try to increase your intake of fiber through using high-fiber foods rather than fiber pills or supplements that contain small amounts of fiber.   The goal is to change the types of food eaten. Do not supplement your present diet with high-fiber foods, but replace foods in your present diet.    Polyps, Colon  A polyp is extra tissue that grows inside your body. Colon polyps grow in the large intestine. The large intestine, also called the colon, is part of your digestive system. It is a long, hollow tube at the end of your digestive tract where your body makes and stores stool. Most polyps are not dangerous. They are benign. This means they are not cancerous. But over time, some types of polyps can turn into cancer. Polyps that are smaller than a pea are usually not harmful. But larger polyps could someday become or may already be cancerous. To be safe, doctors remove all polyps and test them.   PREVENTION There is not one sure way to prevent polyps. You might be able to lower your risk of getting them if you:  Eat more fruits and vegetables and less fatty food.   Do not smoke.   Avoid alcohol.   Exercise every day.   Lose weight if you are overweight.   Eating more calcium and folate can also lower your risk of getting polyps. Some foods that are rich in calcium are milk, cheese, and broccoli. Some foods that are rich in folate are chickpeas, kidney beans, and spinach.    Diverticulosis Diverticulosis is a common condition that develops when small pouches  (diverticula) form in the wall of the colon. The risk of diverticulosis increases with age. It happens more often in people who eat a low-fiber diet. Most individuals with diverticulosis have no symptoms. Those individuals with symptoms usually experience belly (abdominal) pain, constipation, or loose stools (diarrhea).  HOME CARE INSTRUCTIONS  Increase the amount of fiber in your diet as directed by your caregiver or dietician. This may reduce symptoms of diverticulosis.   Drink at least 6 to 8 glasses of water each day to prevent constipation.   Try not to strain when you have a bowel movement.   Avoiding nuts and seeds to prevent complications is NOT NECESSARY.   FOODS HAVING HIGH FIBER CONTENT INCLUDE:  Fruits. Apple, peach, pear, tangerine, raisins, prunes.   Vegetables. Brussels sprouts, asparagus, broccoli, cabbage, carrot, cauliflower, romaine lettuce, spinach, summer squash, tomato, winter squash, zucchini.   Starchy Vegetables. Baked beans, kidney beans, lima beans, split peas, lentils, potatoes (  with skin).   SEEK IMMEDIATE MEDICAL CARE IF:  You develop increasing pain or severe bloating.   You have an oral temperature above 101F.   You develop vomiting or bowel movements that are bloody or black.

## 2019-04-10 NOTE — Progress Notes (Signed)
CC'D TO PCP °

## 2019-04-11 ENCOUNTER — Encounter (HOSPITAL_COMMUNITY): Payer: Self-pay | Admitting: Gastroenterology

## 2019-04-17 ENCOUNTER — Telehealth: Payer: Self-pay | Admitting: Gastroenterology

## 2019-04-17 NOTE — Telephone Encounter (Signed)
PT is aware of results and plan.  

## 2019-04-17 NOTE — Telephone Encounter (Signed)
PLEASE CALL PT. HE HAD SIX SIMPLE ADENOMAS REMOVED.   STOP SMOKING. IT GENERATES POLYPS. DRINK WATER TO KEEP YOUR URINE LIGHT YELLOW. FOLLOW A HIGH FIBER DIET. AVOID ITEMS THAT CAUSE BLOATING.  USE PREPARATION H FOUR TIMES  A DAY IF NEEDED TO RELIEVE RECTAL PAIN/PRESSURE/BLEEDING.  Next colonoscopy in 3 years.

## 2019-04-20 NOTE — Telephone Encounter (Signed)
Reminder in epic °

## 2019-06-03 DIAGNOSIS — I10 Essential (primary) hypertension: Secondary | ICD-10-CM | POA: Diagnosis not present

## 2019-06-03 DIAGNOSIS — E785 Hyperlipidemia, unspecified: Secondary | ICD-10-CM | POA: Diagnosis not present

## 2019-09-05 ENCOUNTER — Other Ambulatory Visit: Payer: Medicare Other

## 2019-09-05 ENCOUNTER — Other Ambulatory Visit: Payer: Self-pay

## 2019-09-05 ENCOUNTER — Ambulatory Visit: Payer: Medicare Other | Attending: Internal Medicine

## 2019-09-05 DIAGNOSIS — Z20822 Contact with and (suspected) exposure to covid-19: Secondary | ICD-10-CM

## 2019-09-07 LAB — NOVEL CORONAVIRUS, NAA: SARS-CoV-2, NAA: NOT DETECTED

## 2019-10-06 DIAGNOSIS — I10 Essential (primary) hypertension: Secondary | ICD-10-CM | POA: Diagnosis not present

## 2019-10-06 DIAGNOSIS — E7200 Disorders of amino-acid transport, unspecified: Secondary | ICD-10-CM | POA: Diagnosis not present

## 2019-10-06 DIAGNOSIS — E785 Hyperlipidemia, unspecified: Secondary | ICD-10-CM | POA: Diagnosis not present

## 2019-10-06 DIAGNOSIS — Z7189 Other specified counseling: Secondary | ICD-10-CM | POA: Diagnosis not present

## 2019-10-11 ENCOUNTER — Ambulatory Visit: Payer: Medicare Other | Attending: Internal Medicine

## 2019-10-11 ENCOUNTER — Other Ambulatory Visit: Payer: Self-pay

## 2019-10-11 DIAGNOSIS — Z23 Encounter for immunization: Secondary | ICD-10-CM

## 2019-10-11 NOTE — Progress Notes (Signed)
   Covid-19 Vaccination Clinic  Name:  Ruben Young    MRN: [id number] DOB: 11/01/1950  10/11/2019  [name] was observed post Covid-19 immunization for 15 minutes without incident. He was provided with Vaccine Information Sheet and instruction to access the V-Safe system.   [name] was instructed to call 911 with any severe reactions post vaccine: Marland Kitchen Difficulty breathing  . Swelling of face and throat  . A fast heartbeat  . A bad rash all over body  . Dizziness and weakness   Immunizations Administered    Name Date Dose VIS Date Route   Moderna COVID-19 Vaccine 10/11/2019  1:28 PM 0.5 mL 07/01/2019 Intramuscular   Manufacturer: Moderna   Lot: [id number]   [id number]

## 2019-10-27 ENCOUNTER — Telehealth (HOSPITAL_COMMUNITY): Payer: Self-pay

## 2019-10-27 NOTE — Telephone Encounter (Signed)

## 2019-10-28 ENCOUNTER — Other Ambulatory Visit: Payer: Self-pay | Admitting: *Deleted

## 2019-10-28 DIAGNOSIS — I723 Aneurysm of iliac artery: Secondary | ICD-10-CM

## 2019-10-29 ENCOUNTER — Other Ambulatory Visit: Payer: Self-pay

## 2019-10-29 ENCOUNTER — Encounter: Payer: Self-pay | Admitting: Vascular Surgery

## 2019-10-29 ENCOUNTER — Ambulatory Visit (INDEPENDENT_AMBULATORY_CARE_PROVIDER_SITE_OTHER): Payer: Medicare Other | Admitting: Vascular Surgery

## 2019-10-29 ENCOUNTER — Ambulatory Visit (INDEPENDENT_AMBULATORY_CARE_PROVIDER_SITE_OTHER)
Admission: RE | Admit: 2019-10-29 | Discharge: 2019-10-29 | Disposition: A | Discharge status: Home / Self Care | Payer: Medicare Other | Source: Ambulatory Visit | Attending: Vascular Surgery | Admitting: Vascular Surgery

## 2019-10-29 ENCOUNTER — Ambulatory Visit (HOSPITAL_COMMUNITY)
Admission: RE | Admit: 2019-10-29 | Discharge: 2019-10-29 | Disposition: A | Discharge status: Home / Self Care | Payer: Medicare Other | Source: Ambulatory Visit | Attending: Vascular Surgery | Admitting: Vascular Surgery

## 2019-10-29 VITALS — BP 124/75 | HR 79 | Temp 97.8°F | Resp 20 | Ht 69.0 in | Wt 169.0 lb

## 2019-10-29 DIAGNOSIS — I723 Aneurysm of iliac artery: Secondary | ICD-10-CM | POA: Diagnosis not present

## 2019-10-29 NOTE — Progress Notes (Signed)
Patient name: Ruben Young MRN: [id number] DOB: 11-Jul-1951 Sex: male  REASON FOR VISIT:   Follow-up of small iliac artery aneurysms.  HPI:   Ruben Young is a pleasant 69 y.o. male who I saw in July 2013 with a 1.6 cm left common iliac artery aneurysm.  I think since that time he has been followed by the nurse practitioner.  He comes in for a yearly study today.  The patient denies any abdominal pain or back pain.  He denies any claudication, rest pain, or nonhealing ulcers.  He is on aspirin and is on a statin.  He smokes 3 to 5 cigarettes a day but sometimes can go a couple days without smoking.  He has had 2 previous heart attacks most recent was 5 to 6 years ago according to the patient.  There is no family history of aneurysmal disease.  His risk factors for peripheral vascular disease include hypertension, hypercholesterolemia, and tobacco use.  He denies any history of diabetes or family history of premature cardiovascular disease.  Past Medical History:  Diagnosis Date  . DDD (degenerative disc disease), lumbar   . Hyperlipidemia   . Hypertension   . MI (myocardial infarction) ([place])   . Old myocardial infarction   . Tobacco use disorder     Family History  Problem Relation Age of Onset  . Cancer Mother   . Heart attack Mother   . Colon cancer Neg Hx     SOCIAL HISTORY: Social History   Tobacco Use  . Smoking status: Current Every Day Smoker    Packs/day: 0.25    Years: 48.00    Pack years: 12.00    Types: Cigarettes  . Smokeless tobacco: Never Used  . Tobacco comment: 1 pk lasts 3-4 days  Substance Use Topics  . Alcohol use: No    Alcohol/week: 0.0 standard drinks    No Known Allergies  Current Outpatient Medications  Medication Sig Dispense Refill  . amLODipine (NORVASC) 10 MG tablet Take 10 mg by mouth daily.    Marland Kitchen aspirin EC 81 MG tablet Take 81 mg by mouth daily.    Marland Kitchen atorvastatin (LIPITOR) 40 MG tablet Take 40 mg by mouth daily at 6  PM.     . carvedilol (COREG) 6.25 MG tablet TAKE 1 TABLET TWICE DAILY WITH MEALS (Patient taking differently: Take 6.25 mg by mouth 2 (two) times daily. ) 180 tablet 3  . Chlorpheniramine-DM (CORICIDIN HBP COUGH/COLD PO) Take 1 tablet by mouth daily.     . Cholecalciferol (VITAMIN D3) 25 MCG (1000 UT) CAPS Take 1,000 Units by mouth daily.     . Multiple Vitamin (MULTIVITAMIN WITH MINERALS) TABS tablet Take 1 tablet by mouth daily.    . Multiple Vitamins-Minerals (ANTIOXIDANT) CAPS Take 1 capsule by mouth daily.     . Multiple Vitamins-Minerals (EYE VITAMINS PO) Take 1 tablet by mouth daily.    . nitroGLYCERIN (NITROSTAT) 0.4 MG SL tablet Place 0.4 mg under the tongue every 5 (five) minutes x 3 doses as needed for chest pain. For chest pains     . Omega-3 Fatty Acids (FISH OIL) 1000 MG CAPS Take 1,000 mg by mouth daily.     Marland Kitchen oxymetazoline (AFRIN) 0.05 % nasal spray Place 1 spray into both nostrils 2 (two) times daily as needed for congestion.    . vitamin C (ASCORBIC ACID) 500 MG tablet Take 500 mg by mouth daily.    . vitamin E 400 UNIT capsule Take 400  Units by mouth daily.     No current facility-administered medications for this visit.    REVIEW OF SYSTEMS:  [X]  denotes positive finding, [ ]  denotes negative finding Cardiac  Comments:  Chest pain or chest pressure:    Shortness of breath upon exertion: x   Short of breath when lying flat:    Irregular heart rhythm:        Vascular    Pain in calf, thigh, or hip brought on by ambulation:    Pain in feet at night that wakes you up from your sleep:     Blood clot in your veins:    Leg swelling:         Pulmonary    Oxygen at home:    Productive cough:     Wheezing:  x       Neurologic    Sudden weakness in arms or legs:     Sudden numbness in arms or legs:     Sudden onset of difficulty speaking or slurred speech:    Temporary loss of vision in one eye:     Problems with dizziness:         Gastrointestinal    Blood in  stool:     Vomited blood:         Genitourinary    Burning when urinating:     Blood in urine:        Psychiatric    Major depression:         Hematologic    Bleeding problems:    Problems with blood clotting too easily:        Skin    Rashes or ulcers:        Constitutional    Fever or chills:     PHYSICAL EXAM:   Vitals:   10/29/19 0957  BP: 124/75  Pulse: 79  Resp: 20  Temp: 97.8 F (36.6 C)  SpO2: 95%  Weight: 169 lb (76.7 kg)  Height: 5\' 9"  (1.753 m)    GENERAL: The patient is a well-nourished male, in no acute distress. The vital signs are documented above. CARDIAC: There is a regular rate and rhythm.  VASCULAR: I do not detect carotid bruits. He has palpable femoral, dorsalis pedis, and posterior tibial pulses bilaterally. He has no significant lower extremity swelling. PULMONARY: There is good air exchange bilaterally without wheezing or rales. ABDOMEN: Soft and non-tender with normal pitched bowel sounds.  I cannot palpate an aneurysm although his abdomen is difficult to assess. MUSCULOSKELETAL: There are no major deformities or cyanosis. NEUROLOGIC: No focal weakness or paresthesias are detected. SKIN: There are no ulcers or rashes noted. PSYCHIATRIC: The patient has a normal affect.  DATA:    AORTOILIAC DUPLEX: I have independently interpreted his aortic duplex scan today.  The maximum diameter of his infrarenal aorta is 2.36 cm.  The right common iliac artery measures 2.4 cm in maximum diameter.  The left common iliac artery measures 2.0 cm in maximum diameter.  ARTERIAL DOPPLER STUDY: I have independently interpreted his arterial Doppler study today.  On the right side there is a triphasic dorsalis pedis and posterior tibial signal.  The ABIs 100%.  Toe pressures 144 mmHg.  On the left side there is a triphasic dorsalis pedis and posterior tibial signal.  ABI is 100%.  Toe pressure is 112 mmHg.  MEDICAL ISSUES:   BILATERAL COMMON ILIAC ARTERY  ANEURYSMS: This patient has small bilateral common iliac artery aneurysms.  The right  common iliac artery measures 2.4 cm in maximum diameter.  It was 2.3 in September 2019.  The left common iliac artery aneurysm measures 2.0 cm in maximum diameter.  It was 1.98 in 2019.  I recommended a follow-up duplex of his aorta and iliac arteries in 18 months.  We have again discussed the importance of tobacco cessation.  He understands we would not consider elective repair of these aneurysm unless they reached approximately 3.5 cm in maximum diameter.  His blood pressure appears to be under good control.  I will see him in 18 months.  He knows to call sooner if he has problems.  Deitra Mayo [facility] [phone]

## 2019-10-30 ENCOUNTER — Other Ambulatory Visit: Payer: Self-pay | Admitting: *Deleted

## 2019-10-30 DIAGNOSIS — I723 Aneurysm of iliac artery: Secondary | ICD-10-CM

## 2019-11-04 ENCOUNTER — Other Ambulatory Visit: Payer: Self-pay

## 2019-11-04 MED ORDER — CARVEDILOL 6.25 MG PO TABS: 6.2500 mg | ORAL_TABLET | Freq: Two times a day (BID) | ORAL | 0 refills | Status: DC

## 2019-11-12 ENCOUNTER — Ambulatory Visit: Payer: Medicare Other | Attending: Internal Medicine

## 2019-11-12 DIAGNOSIS — Z23 Encounter for immunization: Secondary | ICD-10-CM

## 2019-11-12 NOTE — Progress Notes (Signed)
   Covid-19 Vaccination Clinic  Name:  Ruben Young    MRN: [id number] DOB: November 11, 1950  11/12/2019  [name] was observed post Covid-19 immunization for 15 minutes without incident. He was provided with Vaccine Information Sheet and instruction to access the V-Safe system.   [name] was instructed to call 911 with any severe reactions post vaccine: Marland Kitchen Difficulty breathing  . Swelling of face and throat  . A fast heartbeat  . A bad rash all over body  . Dizziness and weakness   Immunizations Administered    Name Date Dose VIS Date Route   Moderna COVID-19 Vaccine 11/12/2019  1:08 PM 0.5 mL 07/01/2019 Intramuscular   Manufacturer: Moderna   Lot: [id number]   [id number]

## 2019-11-25 ENCOUNTER — Ambulatory Visit: Payer: Medicare Other | Attending: Internal Medicine

## 2019-11-25 ENCOUNTER — Other Ambulatory Visit: Payer: Self-pay

## 2019-11-25 DIAGNOSIS — Z20822 Contact with and (suspected) exposure to covid-19: Secondary | ICD-10-CM

## 2019-11-26 LAB — SARS-COV-2, NAA 2 DAY TAT

## 2019-11-26 LAB — NOVEL CORONAVIRUS, NAA: SARS-CoV-2, NAA: NOT DETECTED

## 2020-01-30 ENCOUNTER — Other Ambulatory Visit: Payer: Self-pay | Admitting: Cardiology

## 2020-02-09 ENCOUNTER — Ambulatory Visit (INDEPENDENT_AMBULATORY_CARE_PROVIDER_SITE_OTHER): Payer: Medicare Other | Admitting: Cardiology

## 2020-02-09 ENCOUNTER — Encounter: Payer: Self-pay | Admitting: Cardiology

## 2020-02-09 ENCOUNTER — Telehealth: Payer: Self-pay | Admitting: Cardiology

## 2020-02-09 ENCOUNTER — Other Ambulatory Visit: Payer: Self-pay

## 2020-02-09 VITALS — BP 120/66 | HR 66 | Ht 69.0 in | Wt 168.2 lb

## 2020-02-09 DIAGNOSIS — Z720 Tobacco use: Secondary | ICD-10-CM | POA: Diagnosis not present

## 2020-02-09 DIAGNOSIS — I2510 Atherosclerotic heart disease of native coronary artery without angina pectoris: Secondary | ICD-10-CM

## 2020-02-09 DIAGNOSIS — I723 Aneurysm of iliac artery: Secondary | ICD-10-CM | POA: Diagnosis not present

## 2020-02-09 DIAGNOSIS — F17200 Nicotine dependence, unspecified, uncomplicated: Secondary | ICD-10-CM | POA: Diagnosis not present

## 2020-02-09 DIAGNOSIS — I10 Essential (primary) hypertension: Secondary | ICD-10-CM | POA: Diagnosis not present

## 2020-02-09 MED ORDER — CARVEDILOL 6.25 MG PO TABS: 6.2500 mg | ORAL_TABLET | Freq: Two times a day (BID) | ORAL | 3 refills | Status: DC

## 2020-02-09 MED ORDER — AMLODIPINE BESYLATE 10 MG PO TABS: 10.0000 mg | ORAL_TABLET | Freq: Every day | ORAL | 3 refills | Status: DC

## 2020-02-09 MED ORDER — ATORVASTATIN CALCIUM 40 MG PO TABS: 40.0000 mg | ORAL_TABLET | Freq: Every day | ORAL | 3 refills | Status: DC

## 2020-02-09 NOTE — Patient Instructions (Signed)

## 2020-02-09 NOTE — Progress Notes (Signed)
Cardiology Office Note:    Date:  02/09/2020   ID:  Ruben Young, DOB 1951-04-19, MRN [id number]  PCP:  Iona Beard, MD  [facility] Cardiologist:  Candee Furbish, MD  [facility] Electrophysiologist:  None   Referring MD: Iona Beard, MD     History of Present Illness:    Ruben Young is a 69 y.o. male with CAD, s/pPCI to RCA in the past, anterior STEMI in 08/2012 tx with BMS to LAD, ischemic CM, chronic systolic heart failure ,HTN, HL.   Walks 40 min a day not as much now with COVID, uphill at times, mild SOB. No CP.   2.63 cm left common iliac artery aneurysm, Dr. Scot Dock, normally consider elective repair with reaches 3.5 cm.  01/09/2018 -overall been doing quite well, mild shortness of breath. Main complaint now is fatigue with daytime somnolence and trouble sleeping at night. He says he wakes up many times during the night. Would like refills on his medications. No chest pain fevers chills nausea vomiting syncope.  Studies: - LHC (08/2012): Ant STEMI >>>mid LAD occluded, mid RCA stent ok. PCI: BMS to mid LAD.  - Echo (08/2012): Mild LVH, EF 40-45%, AS dyskinesis, Gr 1 DD.  AAA Duplex(04/26/2018):  Previous size:(Date:03-07-17):AAA: 2.19cm, Right CIA: 2.02 cm;Left CIA: 1.63 cm  Current size:Abdominal aorta: 2.7cm (Date: 04-26-18); Right CIA:2.3 cm; Left CIA:1.9 cm  Aortoiliac duplex 2021  The maximum diameter of his infrarenal aorta is 2.36 cm.  The right common iliac artery measures 2.4 cm in maximum diameter.  The left common iliac artery measures 2.0 cm in maximum diameter.  ARTERIAL DOPPLER STUDY:   On the right side there is a triphasic dorsalis pedis and posterior tibial signal.  The ABIs 100%.  Toe pressures 144 mmHg.  On the left side there is a triphasic dorsalis pedis and posterior tibial signal.  ABI is 100%.  Toe pressure is 112 mmHg.  01/09/19 - doing quite well without any chest discomfort syncope bleeding  orthopnea PND.  He does have some continued shortness of breath with activity.  Continues to smoke.  Walking quite as much at this time.  Compliant with his medications.  02/09/2020-here for the follow-up of coronary artery disease, ischemic cardiomyopathy.  Overall doing quite well.  Has seen vascular with his iliac aneurysms.  Doing well.  Has not been walking recently.  Has been feeling some mild shortness of breath.  Past Medical History:  Diagnosis Date  . DDD (degenerative disc disease), lumbar   . Hyperlipidemia   . Hypertension   . MI (myocardial infarction) ([place])   . Old myocardial infarction   . Tobacco use disorder     Past Surgical History:  Procedure Laterality Date  . COLONOSCOPY N/A 10/01/2015   Procedure: COLONOSCOPY;  Surgeon: Danie Binder, MD;  [facility];  Service: Endoscopy;  Laterality: N/A;  245  . COLONOSCOPY N/A 04/09/2019   Procedure: COLONOSCOPY;  Surgeon: Danie Binder, MD;  [facility];  Service: Endoscopy;  Laterality: N/A;  12:00  . CORONARY ANGIOPLASTY WITH STENT PLACEMENT    . EXCISION MASS LOWER EXTREMETIES Left 01/28/2016   Procedure: EXCISION LEFT BUTTOCKS SINUS TRACH;  Surgeon: Alphonsa Overall, MD;  Location: [facility];  Service: General;  Laterality: Left;  . LEFT HEART CATHETERIZATION WITH CORONARY ANGIOGRAM N/A 09/24/2012   Procedure: LEFT HEART CATHETERIZATION WITH CORONARY ANGIOGRAM;  Surgeon: Jettie Booze, MD;  Location: [facility] CATH LAB;  Service: Cardiovascular;  Laterality: N/A;  .  POLYPECTOMY  04/09/2019   Procedure: POLYPECTOMY;  Surgeon: Danie Binder, MD;  [facility];  Service: Endoscopy;;  . TONSILLECTOMY      Current Medications: Current Meds  Medication Sig  . amLODipine (NORVASC) 10 MG tablet Take 10 mg by mouth daily.  Marland Kitchen aspirin EC 81 MG tablet Take 81 mg by mouth daily.  Marland Kitchen atorvastatin (LIPITOR) 40 MG tablet Take 40 mg by mouth daily at 6 PM.   . carvedilol (COREG) 6.25 MG tablet Take 1 tablet  (6.25 mg total) by mouth 2 (two) times daily with a meal.  . Chlorpheniramine-DM (CORICIDIN HBP COUGH/COLD PO) Take 1 tablet by mouth daily.   . Cholecalciferol (VITAMIN D3) 25 MCG (1000 UT) CAPS Take 1,000 Units by mouth daily.   . Multiple Vitamin (MULTIVITAMIN WITH MINERALS) TABS tablet Take 1 tablet by mouth daily.  . Multiple Vitamins-Minerals (ANTIOXIDANT) CAPS Take 1 capsule by mouth daily.   . Multiple Vitamins-Minerals (EYE VITAMINS PO) Take 1 tablet by mouth daily.  . nitroGLYCERIN (NITROSTAT) 0.4 MG SL tablet Place 0.4 mg under the tongue every 5 (five) minutes x 3 doses as needed for chest pain. For chest pains   . Omega-3 Fatty Acids (FISH OIL) 1000 MG CAPS Take 1,000 mg by mouth daily.   Marland Kitchen oxymetazoline (AFRIN) 0.05 % nasal spray Place 1 spray into both nostrils 2 (two) times daily as needed for congestion.  . vitamin C (ASCORBIC ACID) 500 MG tablet Take 500 mg by mouth daily.  . vitamin E 400 UNIT capsule Take 400 Units by mouth daily.     Allergies:   Patient has no known allergies.   Social History   Socioeconomic History  . Marital status: Single    Spouse name: Not on file  . Number of children: Not on file  . Years of education: Not on file  . Highest education level: Not on file  Occupational History  . Not on file  Tobacco Use  . Smoking status: Current Every Day Smoker    Packs/day: 0.25    Years: 48.00    Pack years: 12.00    Types: Cigarettes  . Smokeless tobacco: Never Used  . Tobacco comment: 1 pk lasts 3-4 days  Vaping Use  . Vaping Use: Never used  Substance and Sexual Activity  . Alcohol use: No    Alcohol/week: 0.0 standard drinks  . Drug use: No    Types: Cocaine    Comment: per patient has not used in 4 years  . Sexual activity: Not on file  Other Topics Concern  . Not on file  Social History Narrative  . Not on file   Social Determinants of Health   Financial Resource Strain:   . Difficulty of Paying Living Expenses:   Food  Insecurity:   . Worried About Charity fundraiser in the Last Year:   . Arboriculturist in the Last Year:   Transportation Needs:   . Film/video editor (Medical):   Marland Kitchen Lack of Transportation (Non-Medical):   Physical Activity:   . Days of Exercise per Week:   . Minutes of Exercise per Session:   Stress:   . Feeling of Stress :   Social Connections:   . Frequency of Communication with Friends and Family:   . Frequency of Social Gatherings with Friends and Family:   . Attends Religious Services:   . Active Member of Clubs or Organizations:   . Attends Archivist Meetings:   .  Marital Status:      Family History: The patient's family history includes Cancer in his mother; Heart attack in his mother. There is no history of Colon cancer.  ROS:   Please see the history of present illness.     All other systems reviewed and are negative.  EKGs/Labs/Other Studies Reviewed:     EKG:  EKG is ordered today.  The ekg ordered today demonstrates sinus rhythm 66 with PAC no other abnormalities  Recent Labs: No results found for requested labs within last 8760 hours.  Recent Lipid Panel    Component Value Date/Time   CHOL 104 04/14/2014 1551   TRIG 58.0 04/14/2014 1551   HDL 29.70 (L) 04/14/2014 1551   CHOLHDL 4 04/14/2014 1551   VLDL 11.6 04/14/2014 1551   LDLCALC 63 04/14/2014 1551    Physical Exam:    VS:  BP 120/66   Pulse 66   Ht 5\' 9"  (1.753 m)   Wt 168 lb 3.2 oz (76.3 kg)   SpO2 97%   BMI 24.84 kg/m     Wt Readings from Last 3 Encounters:  02/09/20 168 lb 3.2 oz (76.3 kg)  10/29/19 169 lb (76.7 kg)  04/09/19 163 lb (73.9 kg)     GEN: Well nourished, well developed, in no acute distress  HEENT: normal  Neck: no JVD, carotid bruits, or masses Cardiac: RRR; no murmurs, rubs, or gallops,no edema  Respiratory:  clear to auscultation bilaterally, normal work of breathing GI: soft, nontender, nondistended, + BS MS: no deformity or atrophy  Skin: warm  and dry, no rash Neuro:  Alert and Oriented x 3, Strength and sensation are intact Psych: euthymic mood, full affect   ASSESSMENT:    1. Coronary artery disease involving native coronary artery of native heart without angina pectoris   2. Aneurysm of iliac artery (HCC)   3. Current smoker    PLAN:    In order of problems listed above:  Bilateral common iliac artery aneurysms -Right common iliac 2.4, left common iliac 2.0. -Vascular surgery note reviewed, Dr. Scot Dock. we would not consider elective repair of these aneurysm unless they reached approximately 3.5 cm in maximum diameter.  His blood pressure appears to be under good control  Coronary artery disease - Continue with aggressive secondary risk factor modification, aspirin 81 beta-blocker statin.  No anginal symptoms currently.  Fatigue, shortness of breath -Certainly could be multifactorial including cigarette use.  Continue to encourage exercise if the symptoms escalate, further testing may be warranted.  He states that he is now smoking a cigar occasionally to try to wean off the cigarettes.  Ischemic cardiomyopathy - EF 40 to 45% in 2014.  Seems like he is maintaining his euvolemia.  Carvedilol.  No changes made.  Stable.  Hyperlipidemia -Continue with statin therapy.  Last LDL 63.  Tobacco use - Continue to encourage tobacco cessation.  Contributing to shortness of breath.   Medication Adjustments/Labs and Tests Ordered: Current medicines are reviewed at length with the patient today.  Concerns regarding medicines are outlined above.  Orders Placed This Encounter  Procedures  . EKG 12-Lead   No orders of the defined types were placed in this encounter.   There are no Patient Instructions on file for this visit.   Signed, Candee Furbish, MD  02/09/2020 3:56 PM    [place]

## 2020-02-09 NOTE — Telephone Encounter (Signed)
*  STAT* If patient is at the pharmacy, call can be transferred to refill team.   1. Which medications need to be refilled? (please list name of each medication and dose if known)  amLODipine (NORVASC) 10 MG tablet atorvastatin (LIPITOR) 40 MG tablet carvedilol (COREG) 6.25 MG tablet  2. Which pharmacy/location (including street and city if local pharmacy) is medication to be sent to? [id number] - [place], [place] [address]  3. Do they need a 30 day or 90 day supply? 90 day  Patient states the refills were sent to he wrong pharmacy. They need to be sent to [facility] on [address].

## 2020-02-10 MED ORDER — AMLODIPINE BESYLATE 10 MG PO TABS: 10.0000 mg | ORAL_TABLET | Freq: Every day | ORAL | 3 refills | Status: DC

## 2020-02-10 MED ORDER — ATORVASTATIN CALCIUM 40 MG PO TABS: 40.0000 mg | ORAL_TABLET | Freq: Every day | ORAL | 3 refills | Status: DC

## 2020-02-10 MED ORDER — CARVEDILOL 6.25 MG PO TABS: 6.2500 mg | ORAL_TABLET | Freq: Two times a day (BID) | ORAL | 3 refills | Status: DC

## 2020-03-28 ENCOUNTER — Emergency Department (HOSPITAL_COMMUNITY)
Admission: EM | Admit: 2020-03-28 | Discharge: 2020-03-28 | Disposition: A | Discharge status: Home / Self Care | Payer: Medicare Other | Attending: Emergency Medicine | Admitting: Emergency Medicine

## 2020-03-28 ENCOUNTER — Other Ambulatory Visit: Payer: Self-pay

## 2020-03-28 ENCOUNTER — Emergency Department (HOSPITAL_COMMUNITY): Payer: Medicare Other

## 2020-03-28 ENCOUNTER — Encounter (HOSPITAL_COMMUNITY): Payer: Self-pay | Admitting: Emergency Medicine

## 2020-03-28 DIAGNOSIS — K5730 Diverticulosis of large intestine without perforation or abscess without bleeding: Secondary | ICD-10-CM | POA: Diagnosis not present

## 2020-03-28 DIAGNOSIS — Z7982 Long term (current) use of aspirin: Secondary | ICD-10-CM | POA: Insufficient documentation

## 2020-03-28 DIAGNOSIS — Z9861 Coronary angioplasty status: Secondary | ICD-10-CM | POA: Diagnosis not present

## 2020-03-28 DIAGNOSIS — I745 Embolism and thrombosis of iliac artery: Secondary | ICD-10-CM | POA: Diagnosis not present

## 2020-03-28 DIAGNOSIS — I700 Atherosclerosis of aorta: Secondary | ICD-10-CM | POA: Diagnosis not present

## 2020-03-28 DIAGNOSIS — K529 Noninfective gastroenteritis and colitis, unspecified: Secondary | ICD-10-CM

## 2020-03-28 DIAGNOSIS — F17210 Nicotine dependence, cigarettes, uncomplicated: Secondary | ICD-10-CM | POA: Insufficient documentation

## 2020-03-28 DIAGNOSIS — Z79899 Other long term (current) drug therapy: Secondary | ICD-10-CM | POA: Diagnosis not present

## 2020-03-28 DIAGNOSIS — I723 Aneurysm of iliac artery: Secondary | ICD-10-CM | POA: Diagnosis not present

## 2020-03-28 DIAGNOSIS — R1030 Lower abdominal pain, unspecified: Secondary | ICD-10-CM | POA: Diagnosis not present

## 2020-03-28 DIAGNOSIS — I2510 Atherosclerotic heart disease of native coronary artery without angina pectoris: Secondary | ICD-10-CM | POA: Insufficient documentation

## 2020-03-28 DIAGNOSIS — Z20822 Contact with and (suspected) exposure to covid-19: Secondary | ICD-10-CM | POA: Insufficient documentation

## 2020-03-28 DIAGNOSIS — B372 Candidiasis of skin and nail: Secondary | ICD-10-CM

## 2020-03-28 DIAGNOSIS — I110 Hypertensive heart disease with heart failure: Secondary | ICD-10-CM | POA: Insufficient documentation

## 2020-03-28 DIAGNOSIS — Z86010 Personal history of colonic polyps: Secondary | ICD-10-CM | POA: Diagnosis not present

## 2020-03-28 DIAGNOSIS — I5022 Chronic systolic (congestive) heart failure: Secondary | ICD-10-CM | POA: Insufficient documentation

## 2020-03-28 LAB — URINALYSIS, ROUTINE W REFLEX MICROSCOPIC
Bilirubin Urine: NEGATIVE
Glucose, UA: NEGATIVE mg/dL
Hgb urine dipstick: NEGATIVE
Ketones, ur: NEGATIVE mg/dL
Nitrite: NEGATIVE
Protein, ur: NEGATIVE mg/dL
Specific Gravity, Urine: 1.025 (ref 1.005–1.030)
pH: 6 (ref 5.0–8.0)

## 2020-03-28 LAB — COMPREHENSIVE METABOLIC PANEL
ALT: 22 U/L (ref 0–44)
AST: 16 U/L (ref 15–41)
Albumin: 3.7 g/dL (ref 3.5–5.0)
Alkaline Phosphatase: 69 U/L (ref 38–126)
Anion gap: 9 (ref 5–15)
BUN: 14 mg/dL (ref 8–23)
CO2: 24 mmol/L (ref 22–32)
Calcium: 9 mg/dL (ref 8.9–10.3)
Chloride: 105 mmol/L (ref 98–111)
Creatinine, Ser: 1.07 mg/dL (ref 0.61–1.24)
GFR calc Af Amer: >60 mL/min (ref >60)
GFR calc non Af Amer: >60 mL/min (ref >60)
Glucose, Bld: 95 mg/dL (ref 70–99)
Potassium: 3.9 mmol/L (ref 3.5–5.1)
Sodium: 138 mmol/L (ref 135–145)
Total Bilirubin: 0.5 mg/dL (ref 0.3–1.2)
Total Protein: 7.7 g/dL (ref 6.5–8.1)

## 2020-03-28 LAB — CBC WITH DIFFERENTIAL/PLATELET
Abs Immature Granulocytes: 0.02 10*3/uL (ref 0.00–0.07)
Basophils Absolute: 0 10*3/uL (ref 0.0–0.1)
Basophils Relative: 0 %
Eosinophils Absolute: 0.2 10*3/uL (ref 0.0–0.5)
Eosinophils Relative: 2 %
HCT: 45 % (ref 39.0–52.0)
Hemoglobin: 14.7 g/dL (ref 13.0–17.0)
Immature Granulocytes: 0 %
Lymphocytes Relative: 26 %
Lymphs Abs: 2.8 10*3/uL (ref 0.7–4.0)
MCH: 29.8 pg (ref 26.0–34.0)
MCHC: 32.7 g/dL (ref 30.0–36.0)
MCV: 91.1 fL (ref 80.0–100.0)
Monocytes Absolute: 1.1 10*3/uL — ABNORMAL HIGH (ref 0.1–1.0)
Monocytes Relative: 10 %
Neutro Abs: 6.6 10*3/uL (ref 1.7–7.7)
Neutrophils Relative %: 62 %
Platelets: 255 10*3/uL (ref 150–400)
RBC: 4.94 MIL/uL (ref 4.22–5.81)
RDW: 14.5 % (ref 11.5–15.5)
WBC: 10.8 10*3/uL — ABNORMAL HIGH (ref 4.0–10.5)
nRBC: 0 % (ref 0.0–0.2)

## 2020-03-28 LAB — URINALYSIS, MICROSCOPIC (REFLEX)
Bacteria, UA: NONE SEEN
RBC / HPF: NONE SEEN RBC/hpf (ref 0–5)

## 2020-03-28 LAB — SARS CORONAVIRUS 2 BY RT PCR (HOSPITAL ORDER, PERFORMED IN ~~LOC~~ HOSPITAL LAB): SARS Coronavirus 2: NEGATIVE

## 2020-03-28 MED ORDER — IOHEXOL 300 MG/ML  SOLN
100.0000 mL | Freq: Once | INTRAMUSCULAR | Status: AC | PRN
  Administered 2020-03-28: 100 mL via INTRAVENOUS

## 2020-03-28 MED ORDER — KETOCONAZOLE 2 % EX CREA: 1.0000 "application " | TOPICAL_CREAM | Freq: Every day | CUTANEOUS | 0 refills | Status: DC

## 2020-03-28 MED ORDER — AMOXICILLIN-POT CLAVULANATE 875-125 MG PO TABS: 1.0000 | ORAL_TABLET | Freq: Two times a day (BID) | ORAL | 0 refills | Status: DC

## 2020-03-28 NOTE — Discharge Instructions (Signed)
Contact a health care provider if: Your symptoms do not go away. You develop new symptoms. Get help right away if you: Have a fever that does not go away with treatment. Develop chills. Have extreme weakness, fainting, or dehydration. Have repeated vomiting. Develop severe pain in your abdomen. Pass bloody or tarry stool.

## 2020-03-28 NOTE — ED Provider Notes (Signed)
[facility] Provider Note   CSN: [id number] Arrival date & time: 03/28/20  1202     History Chief Complaint  Patient presents with  . Abdominal Pain    Ruben Young is a 69 y.o. male with a past medical history of degenerative disc disease, hypertension, hyperlipidemia, history of anal fistula and abscess requiring repair.  Patient states that he had a anal fistula repaired on his left side and had one that was supposed to be repaired on the right side but he never got it done.  Patient states that he has tenesmus to make bowel movements and has been passing clear, mucousy fluid from his rectum.  He has some abdominal distention and lower abdominal pain without fevers, chills, vomiting, urinary symptoms.  He has been vaccinated against the coronavirus.  HPI     Past Medical History:  Diagnosis Date  . DDD (degenerative disc disease), lumbar   . Hyperlipidemia   . Hypertension   . MI (myocardial infarction) ([place])   . Old myocardial infarction   . Tobacco use disorder     Patient Active Problem List   Diagnosis Date Noted  . Personal history of colonic polyps   . Fistula-in-ano 01/28/2016  . Special screening for malignant neoplasms, colon   . Encounter for screening colonoscopy 09/16/2015  . Hematochezia 09/16/2015  . Chronic systolic heart failure ([place]) 04/14/2015  . Tobacco abuse 04/14/2015  . Iliac artery aneurysm, left ([place]) 04/14/2015  . Old MI (myocardial infarction) 04/14/2015  . Essential hypertension 04/14/2015  . Facial cellulitis 11/08/2014  . Aftercare following surgery of the circulatory system, [place] 02/25/2014  . HLD (hyperlipidemia) 02/10/2014  . Coronary atherosclerosis of native coronary artery 11/14/2013  . Essential hypertension, benign 11/14/2013  . Cardiomyopathy, ischemic 11/14/2013  . Acute anterior myocardial infarction ([place]) 09/25/2012  . Tobacco use disorder   . Old myocardial infarction   . Aneurysm of iliac artery  ([place]) 02/21/2012    Past Surgical History:  Procedure Laterality Date  . COLONOSCOPY N/A 10/01/2015   Procedure: COLONOSCOPY;  Surgeon: Danie Binder, MD;  [facility];  Service: Endoscopy;  Laterality: N/A;  245  . COLONOSCOPY N/A 04/09/2019   Procedure: COLONOSCOPY;  Surgeon: Danie Binder, MD;  [facility];  Service: Endoscopy;  Laterality: N/A;  12:00  . CORONARY ANGIOPLASTY WITH STENT PLACEMENT    . EXCISION MASS LOWER EXTREMETIES Left 01/28/2016   Procedure: EXCISION LEFT BUTTOCKS SINUS TRACH;  Surgeon: Alphonsa Overall, MD;  Location: [facility];  Service: General;  Laterality: Left;  . LEFT HEART CATHETERIZATION WITH CORONARY ANGIOGRAM N/A 09/24/2012   Procedure: LEFT HEART CATHETERIZATION WITH CORONARY ANGIOGRAM;  Surgeon: Jettie Booze, MD;  Location: [facility] CATH LAB;  Service: Cardiovascular;  Laterality: N/A;  . POLYPECTOMY  04/09/2019   Procedure: POLYPECTOMY;  Surgeon: Danie Binder, MD;  [facility];  Service: Endoscopy;;  . TONSILLECTOMY         Family History  Problem Relation Age of Onset  . Cancer Mother   . Heart attack Mother   . Colon cancer Neg Hx     Social History   Tobacco Use  . Smoking status: Current Every Day Smoker    Packs/day: 0.25    Years: 48.00    Pack years: 12.00    Types: Cigarettes  . Smokeless tobacco: Never Used  . Tobacco comment: 1 pk lasts 3-4 days  Vaping Use  . Vaping Use: Never used  Substance  Use Topics  . Alcohol use: No    Alcohol/week: 0.0 standard drinks  . Drug use: No    Types: Cocaine    Comment: per patient has not used in 4 years    Home Medications Prior to Admission medications   Medication Sig Start Date End Date Taking? Authorizing Provider  amLODipine (NORVASC) 10 MG tablet Take 1 tablet (10 mg total) by mouth daily. 02/10/20   Jerline Pain, MD  aspirin EC 81 MG tablet Take 81 mg by mouth daily.    [provider]  atorvastatin (LIPITOR) 40 MG tablet Take 1 tablet  (40 mg total) by mouth daily at 6 PM. 02/10/20   Jerline Pain, MD  carvedilol (COREG) 6.25 MG tablet Take 1 tablet (6.25 mg total) by mouth 2 (two) times daily with a meal. 02/10/20   Jerline Pain, MD  Chlorpheniramine-DM (CORICIDIN HBP COUGH/COLD PO) Take 1 tablet by mouth daily.     [provider]  Cholecalciferol (VITAMIN D3) 25 MCG (1000 UT) CAPS Take 1,000 Units by mouth daily.     [provider]  Multiple Vitamin (MULTIVITAMIN WITH MINERALS) TABS tablet Take 1 tablet by mouth daily.    [provider]  Multiple Vitamins-Minerals (ANTIOXIDANT) CAPS Take 1 capsule by mouth daily.     [provider]  Multiple Vitamins-Minerals (EYE VITAMINS PO) Take 1 tablet by mouth daily.    [provider]  nitroGLYCERIN (NITROSTAT) 0.4 MG SL tablet Place 0.4 mg under the tongue every 5 (five) minutes x 3 doses as needed for chest pain. For chest pains     [provider]  Omega-3 Fatty Acids (FISH OIL) 1000 MG CAPS Take 1,000 mg by mouth daily.     [provider]  oxymetazoline (AFRIN) 0.05 % nasal spray Place 1 spray into both nostrils 2 (two) times daily as needed for congestion.    [provider]  vitamin C (ASCORBIC ACID) 500 MG tablet Take 500 mg by mouth daily.    [provider]  vitamin E 400 UNIT capsule Take 400 Units by mouth daily.    [provider]    Allergies    Patient has no known allergies.  Review of Systems   Review of Systems Ten systems reviewed and are negative for acute change, except as noted in the HPI.   Physical Exam Updated Vital Signs BP 130/76 (BP Location: Left Arm)   Pulse 69   Temp 98.1 F (36.7 C) (Oral)   Resp 18   Ht 5\' 9"  (1.753 m)   SpO2 98%   BMI 24.84 kg/m   Physical Exam Vitals and nursing note reviewed.  Constitutional:      General: He is not in acute distress.    Appearance: He is well-developed. He is not diaphoretic.  HENT:     Head:  Normocephalic and atraumatic.  Eyes:     General: No scleral icterus.    Conjunctiva/sclera: Conjunctivae normal.  Cardiovascular:     Rate and Rhythm: Normal rate and regular rhythm.     Heart sounds: Normal heart sounds.  Pulmonary:     Effort: Pulmonary effort is normal. No respiratory distress.     Breath sounds: Normal breath sounds.  Abdominal:     General: There is distension.     Palpations: Abdomen is soft.     Tenderness: There is no abdominal tenderness.  Genitourinary:    Comments: Multiple well-healed scars around the anus.  Thrombosed hemorrhoid  present.  There appears to be some stringy, mucousy discharge from the rectum.  No obvious areas of tenderness or fluctuance on the . Musculoskeletal:     Cervical back: Normal range of motion and neck supple.  Skin:    General: Skin is warm and dry.  Neurological:     Mental Status: He is alert.  Psychiatric:        Behavior: Behavior normal.     ED Results / Procedures / Treatments   Labs (all labs ordered are listed, but only abnormal results are displayed) Labs Reviewed  CBC WITH DIFFERENTIAL/PLATELET - Abnormal; Notable for the following components:      Result Value   WBC 10.8 (*)    Monocytes Absolute 1.1 (*)    All other components within normal limits  SARS CORONAVIRUS 2 BY RT PCR (HOSPITAL ORDER, [place] [facility])  COMPREHENSIVE METABOLIC PANEL  URINALYSIS, ROUTINE W REFLEX MICROSCOPIC    EKG None  Radiology No results found.  Procedures Procedures (including critical care time)  Medications Ordered in ED Medications  iohexol (OMNIPAQUE) 300 MG/ML solution 100 mL (has no administration in time range)    ED Course  I have reviewed the triage vital signs and the nursing notes.  Pertinent labs & imaging results that were available during my care of the patient were reviewed by me and considered in my medical decision making (see chart for details).    MDM  Rules/Calculators/A&P                          Patient here with concern for rectal pain. DDX includes abscess, fistula, hemorrhoid. Labs show elevated white blood cell count at 10.8.  Negative Covid test, CMP without abnormality, urine without abnormality.  I ordered and reviewed images of CT abdomen pelvis which show some colitis without evidence of abscess.  There are some inflammation in the colon as well as in the tissue surrounding the rectum.  We will treat with Augmentin.  There is also an area of tissue breakdown in the gluteal cleft which appears to be candidal intertrigo we will treat with ketoconazole.  Patient is otherwise hemodynamically stable and afebrile.  He appears appropriate for discharge at this time Final Clinical Impression(s) / ED Diagnoses Final diagnoses:  None    Rx / DC Orders ED Discharge Orders    None       Margarita Mail, PA-C 03/30/20 1900    Noemi Chapel, MD 04/03/20 1430

## 2020-03-28 NOTE — ED Triage Notes (Signed)
Pt reports lower abd pain into L side for   "couple of days"  Ambulates erect without guarding   Denies bowel or bladder problems

## 2020-03-28 NOTE — ED Notes (Signed)
VS put in under wrong pt   Pt does not have temp 102-9 Sat 95 per cent ra

## 2020-03-30 DIAGNOSIS — E7849 Other hyperlipidemia: Secondary | ICD-10-CM | POA: Diagnosis not present

## 2020-03-30 DIAGNOSIS — I10 Essential (primary) hypertension: Secondary | ICD-10-CM | POA: Diagnosis not present

## 2020-03-30 DIAGNOSIS — I25119 Atherosclerotic heart disease of native coronary artery with unspecified angina pectoris: Secondary | ICD-10-CM | POA: Diagnosis not present

## 2020-03-30 DIAGNOSIS — Z720 Tobacco use: Secondary | ICD-10-CM | POA: Diagnosis not present

## 2020-06-10 ENCOUNTER — Ambulatory Visit: Payer: Medicare Other | Attending: Internal Medicine

## 2020-06-10 DIAGNOSIS — Z23 Encounter for immunization: Secondary | ICD-10-CM

## 2020-06-10 NOTE — Progress Notes (Signed)
° °  Covid-19 Vaccination Clinic  Name:  TYAN DY    MRN: [id number] DOB: January 14, 1951  06/10/2020  Mr. Freas was observed post Covid-19 immunization for 15 minutes without incident. He was provided with Vaccine Information Sheet and instruction to access the V-Safe system.   Mr. Kaluzny was instructed to call 911 with any severe reactions post vaccine:  Difficulty breathing   Swelling of face and throat   A fast heartbeat   A bad rash all over body   Dizziness and weakness

## 2020-06-14 DIAGNOSIS — I10 Essential (primary) hypertension: Secondary | ICD-10-CM | POA: Diagnosis not present

## 2020-06-29 DIAGNOSIS — I10 Essential (primary) hypertension: Secondary | ICD-10-CM | POA: Diagnosis not present

## 2020-06-29 DIAGNOSIS — I25119 Atherosclerotic heart disease of native coronary artery with unspecified angina pectoris: Secondary | ICD-10-CM | POA: Diagnosis not present

## 2020-06-29 DIAGNOSIS — E7849 Other hyperlipidemia: Secondary | ICD-10-CM | POA: Diagnosis not present

## 2020-06-29 DIAGNOSIS — Z720 Tobacco use: Secondary | ICD-10-CM | POA: Diagnosis not present

## 2020-08-06 DIAGNOSIS — N401 Enlarged prostate with lower urinary tract symptoms: Secondary | ICD-10-CM | POA: Diagnosis not present

## 2020-08-06 DIAGNOSIS — R3912 Poor urinary stream: Secondary | ICD-10-CM | POA: Diagnosis not present

## 2020-08-06 DIAGNOSIS — Z125 Encounter for screening for malignant neoplasm of prostate: Secondary | ICD-10-CM | POA: Diagnosis not present

## 2020-08-06 DIAGNOSIS — R972 Elevated prostate specific antigen [PSA]: Secondary | ICD-10-CM | POA: Diagnosis not present

## 2020-08-06 DIAGNOSIS — R351 Nocturia: Secondary | ICD-10-CM | POA: Diagnosis not present

## 2020-08-12 ENCOUNTER — Other Ambulatory Visit: Payer: Self-pay

## 2020-08-12 MED ORDER — AMLODIPINE BESYLATE 10 MG PO TABS
10.0000 mg | ORAL_TABLET | Freq: Every day | ORAL | 1 refills | Status: DC
Start: 2020-08-12 — End: 2020-12-29

## 2020-08-12 MED ORDER — CARVEDILOL 6.25 MG PO TABS
6.2500 mg | ORAL_TABLET | Freq: Two times a day (BID) | ORAL | 1 refills | Status: DC
Start: 2020-08-12 — End: 2020-12-29

## 2020-08-12 MED ORDER — ATORVASTATIN CALCIUM 40 MG PO TABS
40.0000 mg | ORAL_TABLET | Freq: Every day | ORAL | 1 refills | Status: DC
Start: 2020-08-12 — End: 2020-12-29

## 2020-08-12 NOTE — Telephone Encounter (Signed)
Pt is calling requesting a refill in nitroglycerin. This medication has not been refilled since 2013. Would Dr. Marlou Porch like to refill this medication? Please address

## 2020-08-18 MED ORDER — NITROGLYCERIN 0.4 MG SL SUBL: 0.4000 mg | SUBLINGUAL_TABLET | SUBLINGUAL | 5 refills | Status: DC | PRN

## 2020-09-10 DIAGNOSIS — R972 Elevated prostate specific antigen [PSA]: Secondary | ICD-10-CM | POA: Diagnosis not present

## 2020-09-10 DIAGNOSIS — D075 Carcinoma in situ of prostate: Secondary | ICD-10-CM | POA: Diagnosis not present

## 2020-09-20 DIAGNOSIS — H5200 Hypermetropia, unspecified eye: Secondary | ICD-10-CM | POA: Diagnosis not present

## 2020-09-20 DIAGNOSIS — Z0100 Encounter for examination of eyes and vision without abnormal findings: Secondary | ICD-10-CM | POA: Diagnosis not present

## 2020-09-22 DIAGNOSIS — N401 Enlarged prostate with lower urinary tract symptoms: Secondary | ICD-10-CM | POA: Diagnosis not present

## 2020-09-22 DIAGNOSIS — R972 Elevated prostate specific antigen [PSA]: Secondary | ICD-10-CM | POA: Diagnosis not present

## 2020-09-22 DIAGNOSIS — R351 Nocturia: Secondary | ICD-10-CM | POA: Diagnosis not present

## 2020-10-25 DIAGNOSIS — Z7189 Other specified counseling: Secondary | ICD-10-CM | POA: Diagnosis not present

## 2020-10-25 DIAGNOSIS — I10 Essential (primary) hypertension: Secondary | ICD-10-CM | POA: Diagnosis not present

## 2020-10-25 DIAGNOSIS — Z0000 Encounter for general adult medical examination without abnormal findings: Secondary | ICD-10-CM | POA: Diagnosis not present

## 2020-10-25 DIAGNOSIS — E785 Hyperlipidemia, unspecified: Secondary | ICD-10-CM | POA: Diagnosis not present

## 2020-10-25 DIAGNOSIS — I2510 Atherosclerotic heart disease of native coronary artery without angina pectoris: Secondary | ICD-10-CM | POA: Diagnosis not present

## 2020-10-25 DIAGNOSIS — R972 Elevated prostate specific antigen [PSA]: Secondary | ICD-10-CM | POA: Diagnosis not present

## 2020-10-25 DIAGNOSIS — Z720 Tobacco use: Secondary | ICD-10-CM | POA: Diagnosis not present

## 2020-10-28 DIAGNOSIS — E7849 Other hyperlipidemia: Secondary | ICD-10-CM | POA: Diagnosis not present

## 2020-10-28 DIAGNOSIS — I10 Essential (primary) hypertension: Secondary | ICD-10-CM | POA: Diagnosis not present

## 2020-12-29 ENCOUNTER — Other Ambulatory Visit: Payer: Self-pay | Admitting: Cardiology

## 2021-01-01 ENCOUNTER — Other Ambulatory Visit: Payer: Self-pay

## 2021-01-01 DIAGNOSIS — I723 Aneurysm of iliac artery: Secondary | ICD-10-CM

## 2021-01-27 DIAGNOSIS — E7849 Other hyperlipidemia: Secondary | ICD-10-CM | POA: Diagnosis not present

## 2021-01-27 DIAGNOSIS — I10 Essential (primary) hypertension: Secondary | ICD-10-CM | POA: Diagnosis not present

## 2021-01-27 DIAGNOSIS — I25119 Atherosclerotic heart disease of native coronary artery with unspecified angina pectoris: Secondary | ICD-10-CM | POA: Diagnosis not present

## 2021-01-27 DIAGNOSIS — Z720 Tobacco use: Secondary | ICD-10-CM | POA: Diagnosis not present

## 2021-02-21 DIAGNOSIS — I10 Essential (primary) hypertension: Secondary | ICD-10-CM | POA: Diagnosis not present

## 2021-02-21 DIAGNOSIS — Z720 Tobacco use: Secondary | ICD-10-CM | POA: Diagnosis not present

## 2021-02-21 DIAGNOSIS — I2510 Atherosclerotic heart disease of native coronary artery without angina pectoris: Secondary | ICD-10-CM | POA: Diagnosis not present

## 2021-02-21 DIAGNOSIS — E785 Hyperlipidemia, unspecified: Secondary | ICD-10-CM | POA: Diagnosis not present

## 2021-02-27 DIAGNOSIS — I10 Essential (primary) hypertension: Secondary | ICD-10-CM | POA: Diagnosis not present

## 2021-02-27 DIAGNOSIS — I25119 Atherosclerotic heart disease of native coronary artery with unspecified angina pectoris: Secondary | ICD-10-CM | POA: Diagnosis not present

## 2021-02-27 DIAGNOSIS — Z720 Tobacco use: Secondary | ICD-10-CM | POA: Diagnosis not present

## 2021-02-27 DIAGNOSIS — E7849 Other hyperlipidemia: Secondary | ICD-10-CM | POA: Diagnosis not present

## 2021-03-23 DIAGNOSIS — R3912 Poor urinary stream: Secondary | ICD-10-CM | POA: Diagnosis not present

## 2021-04-29 ENCOUNTER — Ambulatory Visit: Payer: Medicare Other | Admitting: Cardiology

## 2021-04-29 DIAGNOSIS — I10 Essential (primary) hypertension: Secondary | ICD-10-CM | POA: Diagnosis not present

## 2021-04-29 DIAGNOSIS — Z720 Tobacco use: Secondary | ICD-10-CM | POA: Diagnosis not present

## 2021-04-29 DIAGNOSIS — E7849 Other hyperlipidemia: Secondary | ICD-10-CM | POA: Diagnosis not present

## 2021-04-29 DIAGNOSIS — I25119 Atherosclerotic heart disease of native coronary artery with unspecified angina pectoris: Secondary | ICD-10-CM | POA: Diagnosis not present

## 2021-05-05 ENCOUNTER — Other Ambulatory Visit: Payer: Self-pay | Admitting: Cardiology

## 2021-05-05 ENCOUNTER — Other Ambulatory Visit (HOSPITAL_COMMUNITY): Payer: Medicare Other

## 2021-05-05 ENCOUNTER — Ambulatory Visit: Payer: Medicare Other | Admitting: Vascular Surgery

## 2021-05-05 MED ORDER — NITROGLYCERIN 0.4 MG SL SUBL: 0.4000 mg | SUBLINGUAL_TABLET | SUBLINGUAL | 0 refills | Status: AC | PRN

## 2021-05-25 ENCOUNTER — Other Ambulatory Visit: Payer: Self-pay | Admitting: Cardiology

## 2021-06-02 ENCOUNTER — Ambulatory Visit: Payer: Medicare Other | Admitting: Vascular Surgery

## 2021-06-16 ENCOUNTER — Other Ambulatory Visit: Payer: Self-pay | Admitting: Urology

## 2021-06-16 DIAGNOSIS — Z125 Encounter for screening for malignant neoplasm of prostate: Secondary | ICD-10-CM

## 2021-06-16 DIAGNOSIS — R972 Elevated prostate specific antigen [PSA]: Secondary | ICD-10-CM

## 2021-06-28 DIAGNOSIS — I10 Essential (primary) hypertension: Secondary | ICD-10-CM | POA: Diagnosis not present

## 2021-06-28 DIAGNOSIS — I2510 Atherosclerotic heart disease of native coronary artery without angina pectoris: Secondary | ICD-10-CM | POA: Diagnosis not present

## 2021-06-28 DIAGNOSIS — E785 Hyperlipidemia, unspecified: Secondary | ICD-10-CM | POA: Diagnosis not present

## 2021-06-30 ENCOUNTER — Other Ambulatory Visit: Payer: Self-pay

## 2021-06-30 ENCOUNTER — Encounter: Payer: Self-pay | Admitting: Vascular Surgery

## 2021-06-30 ENCOUNTER — Ambulatory Visit (HOSPITAL_COMMUNITY)
Admission: RE | Admit: 2021-06-30 | Discharge: 2021-06-30 | Disposition: A | Discharge status: Home / Self Care | Payer: Medicare Other | Source: Ambulatory Visit | Attending: Vascular Surgery | Admitting: Vascular Surgery

## 2021-06-30 ENCOUNTER — Ambulatory Visit (INDEPENDENT_AMBULATORY_CARE_PROVIDER_SITE_OTHER): Payer: Medicare Other | Admitting: Vascular Surgery

## 2021-06-30 VITALS — BP 127/76 | HR 63 | Temp 98.2°F | Resp 20 | Ht 69.0 in | Wt 173.0 lb

## 2021-06-30 DIAGNOSIS — I723 Aneurysm of iliac artery: Secondary | ICD-10-CM

## 2021-06-30 NOTE — Progress Notes (Signed)
REASON FOR VISIT:   Follow-up of bilateral common iliac artery aneurysms  MEDICAL ISSUES:   BILATERAL COMMON ILIAC ARTERY ANEURYSMS: His aneurysms have not increased in size.  The right common iliac artery was 2.3 cm today.  It was 2.4 cm on 10/29/2019.  The left common iliac artery was measured at 1.1 cm today although it was 2 cm on 10/29/2019.  Thus we may have seen it at a different angle on today's study.  Regardless, there has been no significant change in the size of his iliac artery aneurysms.  For this reason I think we can continue his follow-up at 18 months.  We have again discussed the importance of tobacco cessation.  His blood pressure is under good control.  I will see him in 18 months.  He knows to call sooner if he has problems.  HPI:   Ruben Young is a pleasant 70 y.o. male who I have been following with small iliac artery aneurysms.  In 2013 he had a 1.6 sonometer right common iliac artery aneurysm.  I last saw him on 10/29/2019.  At that time, the infrarenal aorta measured 2.36 cm in diameter.  The right common iliac artery measured 2.4 cm in diameter.  The left common iliac artery measured 2.0 cm in diameter.  At the time of his last visit he also had arterial Doppler studies which showed normal ABIs bilaterally and normal toe pressures.  When I saw him last we discussed the importance of tobacco cessation.  His blood pressure appeared to be under good control.  I recommended follow-up visit in 18 months.  Since I saw him last, he denies any abdominal pain or back pain.  His blood pressure has been under good control.  He has had no recent cardiac issues.  He denies chest pain.  He does admit to some dyspnea on exertion.  He tells me he only smokes 2 to 3 cigarettes a day.  Past Medical History:  Diagnosis Date   AAA (abdominal aortic aneurysm)    DDD (degenerative disc disease), lumbar    Hyperlipidemia    Hypertension    MI (myocardial infarction) ([place])    Old  myocardial infarction    Tobacco use disorder     Family History  Problem Relation Age of Onset   Cancer Mother    Heart attack Mother    Colon cancer Neg Hx     SOCIAL HISTORY: Social History   Tobacco Use   Smoking status: Every Day    Packs/day: 0.25    Years: 48.00    Pack years: 12.00    Types: Cigarettes   Smokeless tobacco: Never   Tobacco comments:    1 pk lasts 3-4 days  Substance Use Topics   Alcohol use: No    Alcohol/week: 0.0 standard drinks    No Known Allergies  Current Outpatient Medications  Medication Sig Dispense Refill   amLODipine (NORVASC) 10 MG tablet TAKE 1 TABLET BY MOUTH  DAILY 30 tablet 2   amoxicillin-clavulanate (AUGMENTIN) 875-125 MG tablet Take 1 tablet by mouth 2 (two) times daily. One po bid x 7 days 14 tablet 0   aspirin EC 81 MG tablet Take 81 mg by mouth daily.     atorvastatin (LIPITOR) 40 MG tablet TAKE 1 TABLET BY MOUTH  DAILY AT 6PM 30 tablet 2   carvedilol (COREG) 6.25 MG tablet TAKE 1 TABLET BY MOUTH  TWICE DAILY WITH MEALS 60 tablet 2   Chlorpheniramine-DM (  CORICIDIN HBP COUGH/COLD PO) Take 1 tablet by mouth daily.      Cholecalciferol (VITAMIN D3) 25 MCG (1000 UT) CAPS Take 1,000 Units by mouth daily.      ketoconazole (NIZORAL) 2 % cream Apply 1 application topically daily. 30 g 0   Multiple Vitamin (MULTIVITAMIN WITH MINERALS) TABS tablet Take 1 tablet by mouth daily.     Multiple Vitamins-Minerals (ANTIOXIDANT) CAPS Take 1 capsule by mouth daily.      Multiple Vitamins-Minerals (EYE VITAMINS PO) Take 1 tablet by mouth daily.     nitroGLYCERIN (NITROSTAT) 0.4 MG SL tablet Place 1 tablet (0.4 mg total) under the tongue every 5 (five) minutes x 3 doses as needed for chest pain. For chest pains 25 tablet 0   Omega-3 Fatty Acids (FISH OIL) 1000 MG CAPS Take 1,000 mg by mouth daily.      oxymetazoline (AFRIN) 0.05 % nasal spray Place 1 spray into both nostrils 2 (two) times daily as needed for congestion.     vitamin C (ASCORBIC  ACID) 500 MG tablet Take 500 mg by mouth daily.     vitamin E 400 UNIT capsule Take 400 Units by mouth daily.     No current facility-administered medications for this visit.    REVIEW OF SYSTEMS:  [X]  denotes positive finding, [ ]  denotes negative finding Cardiac  Comments:  Chest pain or chest pressure:    Shortness of breath upon exertion: X   Short of breath when lying flat:    Irregular heart rhythm:        Vascular    Pain in calf, thigh, or hip brought on by ambulation:    Pain in feet at night that wakes you up from your sleep:     Blood clot in your veins:    Leg swelling:         Pulmonary    Oxygen at home:    Productive cough:     Wheezing:         Neurologic    Sudden weakness in arms or legs:     Sudden numbness in arms or legs:     Sudden onset of difficulty speaking or slurred speech:    Temporary loss of vision in one eye:     Problems with dizziness:         Gastrointestinal    Blood in stool:     Vomited blood:         Genitourinary    Burning when urinating:     Blood in urine:        Psychiatric    Major depression:         Hematologic    Bleeding problems:    Problems with blood clotting too easily:        Skin    Rashes or ulcers:        Constitutional    Fever or chills:     PHYSICAL EXAM:   Vitals:   06/30/21 0855  BP: 127/76  Pulse: 63  Resp: 20  Temp: 98.2 F (36.8 C)  SpO2: 92%  Weight: 173 lb (78.5 kg)  Height: 5\' 9"  (1.753 m)    GENERAL: The patient is a well-nourished male, in no acute distress. The vital signs are documented above. CARDIAC: There is a regular rate and rhythm.  VASCULAR: I do not detect carotid bruits. He has palpable femoral, popliteal, and pedal pulses bilaterally. PULMONARY: There is good air exchange bilaterally without wheezing or rales.  ABDOMEN: Soft and non-tender with normal pitched bowel sounds.  MUSCULOSKELETAL: There are no major deformities or cyanosis. NEUROLOGIC: No focal weakness  or paresthesias are detected. SKIN: There are no ulcers or rashes noted. PSYCHIATRIC: The patient has a normal affect.  DATA:    DUPLEX ABDOMINAL AORTA: I have independently interpreted his duplex of the abdominal aorta today.  The maximum diameter of his infrarenal aorta is 2.03 cm.  The right common iliac artery measures 2.3 cm in maximum diameter.  The left common iliac artery measures 1.1 cm in maximum diameter.  Deitra Mayo [facility] [phone]

## 2021-07-14 ENCOUNTER — Other Ambulatory Visit: Payer: Self-pay

## 2021-07-14 ENCOUNTER — Ambulatory Visit
Admission: RE | Admit: 2021-07-14 | Discharge: 2021-07-14 | Disposition: A | Discharge status: Home / Self Care | Payer: Medicare Other | Source: Ambulatory Visit | Attending: Urology | Admitting: Urology

## 2021-07-14 DIAGNOSIS — K5730 Diverticulosis of large intestine without perforation or abscess without bleeding: Secondary | ICD-10-CM | POA: Diagnosis not present

## 2021-07-14 DIAGNOSIS — I723 Aneurysm of iliac artery: Secondary | ICD-10-CM | POA: Diagnosis not present

## 2021-07-14 DIAGNOSIS — Z125 Encounter for screening for malignant neoplasm of prostate: Secondary | ICD-10-CM

## 2021-07-14 DIAGNOSIS — R972 Elevated prostate specific antigen [PSA]: Secondary | ICD-10-CM

## 2021-07-14 MED ORDER — GADOBENATE DIMEGLUMINE 529 MG/ML IV SOLN
15.0000 mL | Freq: Once | INTRAVENOUS | Status: AC | PRN
  Administered 2021-07-14: 15 mL via INTRAVENOUS

## 2021-09-20 ENCOUNTER — Ambulatory Visit (INDEPENDENT_AMBULATORY_CARE_PROVIDER_SITE_OTHER): Payer: 59 | Admitting: Cardiology

## 2021-09-20 ENCOUNTER — Encounter: Payer: Self-pay | Admitting: Cardiology

## 2021-09-20 ENCOUNTER — Other Ambulatory Visit: Payer: Self-pay

## 2021-09-20 VITALS — BP 124/64 | HR 63 | Ht 69.0 in | Wt 165.0 lb

## 2021-09-20 DIAGNOSIS — I2510 Atherosclerotic heart disease of native coronary artery without angina pectoris: Secondary | ICD-10-CM

## 2021-09-20 DIAGNOSIS — I252 Old myocardial infarction: Secondary | ICD-10-CM

## 2021-09-20 DIAGNOSIS — I723 Aneurysm of iliac artery: Secondary | ICD-10-CM | POA: Diagnosis not present

## 2021-09-20 DIAGNOSIS — E781 Pure hyperglyceridemia: Secondary | ICD-10-CM

## 2021-09-20 DIAGNOSIS — R5383 Other fatigue: Secondary | ICD-10-CM

## 2021-09-20 DIAGNOSIS — I5022 Chronic systolic (congestive) heart failure: Secondary | ICD-10-CM

## 2021-09-20 DIAGNOSIS — Z720 Tobacco use: Secondary | ICD-10-CM

## 2021-09-20 NOTE — Assessment & Plan Note (Signed)
Atorvastatin 40 mg.  Last LDL 63.  Continue to monitor.

## 2021-09-20 NOTE — Assessment & Plan Note (Signed)
Old anterior MI in 2014 with LAD bare-metal stent

## 2021-09-20 NOTE — Assessment & Plan Note (Addendum)
Right common iliac 2.4 cm, left common iliac 2.0 cm.  Followed by vascular.  Asymptomatic.  Reviewed note from vascular surgery, would not consider repair until they reached 3.5 cm in maximal diameter.

## 2021-09-20 NOTE — Assessment & Plan Note (Signed)
No changes from prior.  Continue to encourage exercise, walking, smoking cessation.

## 2021-09-20 NOTE — Assessment & Plan Note (Addendum)
Continue to encourage tobacco cessation.  States that he is doing better with this.  Continuing to encourage complete cessation.

## 2021-09-20 NOTE — Patient Instructions (Signed)
Medication Instructions:  The current medical regimen is effective;  continue present plan and medications.  *If you need a refill on your cardiac medications before your next appointment, please call your pharmacy*  Follow-Up: At [facility], you and your health needs are our priority.  As part of our continuing mission to provide you with exceptional heart care, we have created designated Provider Care Teams.  These Care Teams include your primary Cardiologist (physician) and Advanced Practice Providers (APPs -  Physician Assistants and Nurse Practitioners) who all work together to provide you with the care you need, when you need it.  We recommend signing up for the patient portal called "MyChart".  Sign up information is provided on this After Visit Summary.  MyChart is used to connect with patients for Virtual Visits (Telemedicine).  Patients are able to view lab/test results, encounter notes, upcoming appointments, etc.  Non-urgent messages can be sent to your provider as well.   To learn more about what you can do with MyChart, go to [email or web address].    Your next appointment:   1 year(s)  The format for your next appointment:   In Person  Provider:   Mark Skains, MD   Thank you for choosing [facility]!!    

## 2021-09-20 NOTE — Assessment & Plan Note (Signed)
Overall doing well with bare-metal stent to the LAD in the setting of prior anterior STEMI in 2014.  Continue with goal-directed medical therapy.  He is on aspirin, statin, beta-blocker.

## 2021-09-20 NOTE — Assessment & Plan Note (Signed)
Ischemic cardiomyopathy from 2014 anterior STEMI with mid LAD occlusion.  Had bare-metal stent to mid LAD.  EF was 40 to 45% at that time.  On goal-directed medical therapy which includes carvedilol

## 2021-09-20 NOTE — Progress Notes (Signed)
Cardiology Office Note:    Date:  09/20/2021   ID:  Ruben Young, DOB [birth year], MRN [id number]  PCP:  Ruben Beard, MD  [facility] Cardiologist:  Ruben Furbish, MD  [facility] Electrophysiologist:  None   Referring MD: Ruben Beard, MD   History of Present Illness:    Ruben Young is a 71 y.o. male with CAD, s/p PCI to RCA in the past, anterior STEMI in 08/2012 tx with BMS to LAD, ischemic CM,  chronic systolic heart failure ,HTN, HL.     Walks 40 min a day not as much now with COVID, uphill at times, mild SOB.     2.63 cm left common iliac artery aneurysm, Dr. Scot Dock, normally consider elective repair with reaches 3.5 cm.   01/09/2018 -overall been doing quite well, mild shortness of breath.  Main complaint now is fatigue with daytime somnolence and trouble sleeping at night.  He says he wakes up many times during the night.  Would like refills on his medications.   01/09/19 - doing quite well.  He did have some shortness of breath with activity.  Continued to smoke.  Walking quite as much at this time.  Compliant with his medications.  02/09/2020-here for the follow-up of coronary artery disease, ischemic cardiomyopathy.  Overall was doing quite well.  Has seen vascular with his iliac aneurysms.  Doing well.  Has not been walking recently.  Was feeling some mild shortness of breath.  Today, he is doing well with no major complaints. He has not smoked a cigarette in 2 days and continues trying to quit. He is tolerating his medications.   After walking up hills, he becomes short of breath. He reports he does not have the energy he had in the past.   His last stent was placed in 2014.   He denies any palpitations, chest pain, lightheadedness, headaches, syncope, orthopnea, PND, or lower extremity edema.  Past Medical History:  Diagnosis Date   AAA (abdominal aortic aneurysm)    DDD (degenerative disc disease), lumbar    Hyperlipidemia    Hypertension    MI  (myocardial infarction) ([place])    Old myocardial infarction    Tobacco use disorder     Past Surgical History:  Procedure Laterality Date   COLONOSCOPY N/A 10/01/2015   Procedure: COLONOSCOPY;  Surgeon: Danie Binder, MD;  Location: [facility];  Service: Endoscopy;  Laterality: N/A;  245   COLONOSCOPY N/A 04/09/2019   Procedure: COLONOSCOPY;  Surgeon: Danie Binder, MD;  Location: [facility];  Service: Endoscopy;  Laterality: N/A;  12:00   CORONARY ANGIOPLASTY WITH STENT PLACEMENT     EXCISION MASS LOWER EXTREMETIES Left 01/28/2016   Procedure: EXCISION LEFT BUTTOCKS SINUS TRACH;  Surgeon: Alphonsa Overall, MD;  Location: [facility];  Service: General;  Laterality: Left;   LEFT HEART CATHETERIZATION WITH CORONARY ANGIOGRAM N/A 09/24/2012   Procedure: LEFT HEART CATHETERIZATION WITH CORONARY ANGIOGRAM;  Surgeon: Jettie Booze, MD;  Location: [facility] CATH LAB;  Service: Cardiovascular;  Laterality: N/A;   POLYPECTOMY  04/09/2019   Procedure: POLYPECTOMY;  Surgeon: Danie Binder, MD;  Location: [facility];  Service: Endoscopy;;   TONSILLECTOMY      Current Medications: Current Meds  Medication Sig   amLODipine (NORVASC) 10 MG tablet TAKE 1 TABLET BY MOUTH  DAILY   aspirin EC 81 MG tablet Take 81 mg by mouth daily.   atorvastatin (LIPITOR) 40 MG tablet TAKE 1 TABLET BY MOUTH  DAILY AT 6PM   carvedilol (COREG) 6.25 MG tablet TAKE 1 TABLET BY MOUTH  TWICE DAILY WITH MEALS   Cholecalciferol (VITAMIN D3) 25 MCG (1000 UT) CAPS Take 1,000 Units by mouth daily.    ketoconazole (NIZORAL) 2 % cream Apply 1 application topically daily.   Multiple Vitamin (MULTIVITAMIN WITH MINERALS) TABS tablet Take 1 tablet by mouth daily.   Multiple Vitamins-Minerals (ANTIOXIDANT) CAPS Take 1 capsule by mouth daily.    Multiple Vitamins-Minerals (EYE VITAMINS PO) Take 1 tablet by mouth daily.   nitroGLYCERIN (NITROSTAT) 0.4 MG SL tablet Place 1 tablet (0.4 mg total) under the tongue every 5 (five) minutes x 3  doses as needed for chest pain. For chest pains   Omega-3 Fatty Acids (FISH OIL) 1000 MG CAPS Take 1,000 mg by mouth daily.    oxymetazoline (AFRIN) 0.05 % nasal spray Place 1 spray into both nostrils 2 (two) times daily as needed for congestion.   vitamin C (ASCORBIC ACID) 500 MG tablet Take 500 mg by mouth daily.   vitamin E 400 UNIT capsule Take 400 Units by mouth daily.     Allergies:   Patient has no known allergies.   Social History   Socioeconomic History   Marital status: Single    Spouse name: Not on file   Number of children: Not on file   Years of education: Not on file   Highest education level: Not on file  Occupational History   Not on file  Tobacco Use   Smoking status: Every Day    Packs/day: 0.25    Years: 48.00    Pack years: 12.00    Types: Cigarettes   Smokeless tobacco: Never   Tobacco comments:    1 pk lasts 3-4 days  Vaping Use   Vaping Use: Never used  Substance and Sexual Activity   Alcohol use: No    Alcohol/week: 0.0 standard drinks   Drug use: No    Types: Cocaine    Comment: per patient has not used in 4 years   Sexual activity: Not on file  Other Topics Concern   Not on file  Social History Narrative   Not on file   Social Determinants of Health   Financial Resource Strain: Not on file  Food Insecurity: Not on file  Transportation Needs: Not on file  Physical Activity: Not on file  Stress: Not on file  Social Connections: Not on file     Family History: The patient's family history includes Cancer in his mother; Heart attack in his mother. There is no history of Colon cancer.  ROS:   Please see the history of present illness.    (+) Dyspnea on exertion (+) Fatigue All other systems reviewed and are negative.  EKGs/Labs/Other Studies Reviewed:   Studies: Abdominal Aorta Study 06/30/21 Abdominal Aorta: No evidence of an abdominal aortic aneurysm was  visualized.   There is evidence of abnormal dilation of the Right Common  Iliac artery.  The maximum right common iliac artery diameter measurement is 2.3 cm. This  is essentially unchanged compared to the prior exam. Previous diameter  measurement was 2.4 cm obtained on  10/29/2019.   The largest aortic measurement is 2.1 cm. The largest aortic diameter  remains essentially unchanged compared to prior exam. Previous diameter  measurement was 2.3 cm obtained on 10/29/2019.    - LHC (08/2012):  Ant STEMI >>> mid LAD occluded, mid RCA stent ok.  PCI:  BMS to mid LAD.    -  Echo (08/2012):  Mild LVH, EF 40-45%, AS dyskinesis, Gr 1 DD.   AAA Duplex (04/26/2018): Previous size: (Date: 03-07-17): AAA: 2.19 cm, Right CIA: 2.02 cm; Left CIA: 1.63 cm   Current size:  Abdominal aorta: 2.7 cm (Date: 04-26-18); Right CIA: 2.3 cm; Left CIA: 1.9 cm  Aortoiliac duplex 2021  The maximum diameter of his infrarenal aorta is 2.36 cm.  The right common iliac artery measures 2.4 cm in maximum diameter.  The left common iliac artery measures 2.0 cm in maximum diameter.   ARTERIAL DOPPLER STUDY:    On the right side there is a triphasic dorsalis pedis and posterior tibial signal.  The ABIs 100%.  Toe pressures 144 mmHg.   On the left side there is a triphasic dorsalis pedis and posterior tibial signal.  ABI is 100%.  Toe pressure is 112 mmHg.  EKG:  EKG was personally reviewed 09/20/21: Sinus rhythm, rate 63 bpm with PAC 02/09/20: sinus rhythm 66 with PAC no other abnormalities  Recent Labs: No results found for requested labs within last 8760 hours.  Recent Lipid Panel    Component Value Date/Time   CHOL 104 04/14/2014 1551   TRIG 58.0 04/14/2014 1551   HDL 29.70 (L) 04/14/2014 1551   CHOLHDL 4 04/14/2014 1551   VLDL 11.6 04/14/2014 1551   LDLCALC 63 04/14/2014 1551    Physical Exam:    VS:  BP 124/64 (BP Location: Left Arm, Patient Position: Sitting, Cuff Size: Normal)    Pulse 63    Ht 5\' 9"  (1.753 m)    Wt 165 lb (74.8 kg)    SpO2 95%    BMI 24.37 kg/m     Wt Readings  from Last 3 Encounters:  09/20/21 165 lb (74.8 kg)  06/30/21 173 lb (78.5 kg)  02/09/20 168 lb 3.2 oz (76.3 kg)    GEN:  Well nourished, well developed in no acute distress HEENT: Normal NECK: No JVD; No carotid bruits LYMPHATICS: No lymphadenopathy CARDIAC: RRR, no murmurs, rubs, gallops RESPIRATORY:  Clear to auscultation without rales, wheezing or rhonchi  ABDOMEN: Soft, non-tender, non-distended MUSCULOSKELETAL:  No edema; No deformity  SKIN: Warm and dry NEUROLOGIC:  Alert and oriented x 3 PSYCHIATRIC:  Normal affect   ASSESSMENT:    1. Chronic systolic heart failure ([place])   2. Atherosclerosis of native coronary artery of native heart without angina pectoris   3. Aneurysm of iliac artery (H[facility])   4. Old MI (myocardial infarction)   5. Pure hypertriglyceridemia   6. Tobacco abuse   7. Fatigue, unspecified type     PLAN:    Chronic systolic heart failure ([place]) Ischemic cardiomyopathy from 2014 anterior STEMI with mid LAD occlusion.  Had bare-metal stent to mid LAD.  EF was 40 to 45% at that time.  On goal-directed medical therapy which includes carvedilol  Coronary atherosclerosis of native coronary artery Overall doing well with bare-metal stent to the LAD in the setting of prior anterior STEMI in 2014.  Continue with goal-directed medical therapy.  He is on aspirin, statin, beta-blocker.  Aneurysm of iliac artery (H[facility]) Right common iliac 2.4 cm, left common iliac 2.0 cm.  Followed by vascular.  Asymptomatic.  Reviewed note from vascular surgery, would not consider repair until they reached 3.5 cm in maximal diameter.  Old MI (myocardial infarction) Old anterior MI in 2014 with LAD bare-metal stent  HLD (hyperlipidemia) Atorvastatin 40 mg.  Last LDL 63.  Continue to monitor.  Tobacco abuse Continue to encourage tobacco  cessation.  States that he is doing better with this.  Continuing to encourage complete cessation.  Fatigue No changes from prior.  Continue to  encourage exercise, walking, smoking cessation.  Medication Adjustments/Labs and Tests Ordered: Current medicines are reviewed at length with the patient today.  Concerns regarding medicines are outlined above.  Orders Placed This Encounter  Procedures   EKG 12-Lead   No orders of the defined types were placed in this encounter.   Patient Instructions  Medication Instructions:  The current medical regimen is effective;  continue present plan and medications.  *If you need a refill on your cardiac medications before your next appointment, please call your pharmacy*  Follow-Up: At [facility], you and your health needs are our priority.  As part of our continuing mission to provide you with exceptional heart care, we have created designated Provider Care Teams.  These Care Teams include your primary Cardiologist (physician) and Advanced Practice Providers (APPs -  Physician Assistants and Nurse Practitioners) who all work together to provide you with the care you need, when you need it.  We recommend signing up for the patient portal called "MyChart".  Sign up information is provided on this After Visit Summary.  MyChart is used to connect with patients for Virtual Visits (Telemedicine).  Patients are able to view lab/test results, encounter notes, upcoming appointments, etc.  Non-urgent messages can be sent to your provider as well.   To learn more about what you can do with MyChart, go to [email or web address].    Your next appointment:   1 year(s)  The format for your next appointment:   In Person  Provider:   Candee Furbish, MD {  Thank you for choosing [facility]!!      Court Joy Javier,acting as a scribe for Ruben Furbish, MD.,have documented all relevant documentation on the behalf of Ruben Furbish, MD,as directed by  Ruben Furbish, MD while in the presence of Ruben Furbish, MD.  I, Ruben Furbish, MD, have reviewed all documentation for this visit. The documentation on  09/20/21 for the exam, diagnosis, procedures, and orders are all accurate and complete.   Signed, Ruben Furbish, MD  09/20/2021 9:40 AM    [facility]

## 2022-01-02 ENCOUNTER — Encounter (HOSPITAL_COMMUNITY): Payer: Self-pay

## 2022-01-02 ENCOUNTER — Emergency Department (HOSPITAL_COMMUNITY): Payer: 59

## 2022-01-02 ENCOUNTER — Emergency Department (HOSPITAL_COMMUNITY)
Admission: EM | Admit: 2022-01-02 | Discharge: 2022-01-02 | Disposition: A | Discharge status: Home / Self Care | Payer: 59 | Attending: Emergency Medicine | Admitting: Emergency Medicine

## 2022-01-02 ENCOUNTER — Other Ambulatory Visit: Payer: Self-pay

## 2022-01-02 DIAGNOSIS — S022XXA Fracture of nasal bones, initial encounter for closed fracture: Secondary | ICD-10-CM | POA: Insufficient documentation

## 2022-01-02 DIAGNOSIS — Z79899 Other long term (current) drug therapy: Secondary | ICD-10-CM | POA: Insufficient documentation

## 2022-01-02 DIAGNOSIS — S0083XA Contusion of other part of head, initial encounter: Secondary | ICD-10-CM | POA: Insufficient documentation

## 2022-01-02 DIAGNOSIS — I509 Heart failure, unspecified: Secondary | ICD-10-CM | POA: Diagnosis not present

## 2022-01-02 DIAGNOSIS — I110 Hypertensive heart disease with heart failure: Secondary | ICD-10-CM | POA: Insufficient documentation

## 2022-01-02 DIAGNOSIS — S0993XA Unspecified injury of face, initial encounter: Secondary | ICD-10-CM | POA: Diagnosis present

## 2022-01-02 DIAGNOSIS — I2510 Atherosclerotic heart disease of native coronary artery without angina pectoris: Secondary | ICD-10-CM | POA: Insufficient documentation

## 2022-01-02 DIAGNOSIS — Z7982 Long term (current) use of aspirin: Secondary | ICD-10-CM | POA: Diagnosis not present

## 2022-01-02 MED ORDER — KETOROLAC TROMETHAMINE 60 MG/2ML IM SOLN
30.0000 mg | Freq: Once | INTRAMUSCULAR | Status: DC
  Filled 2022-01-02: qty 2

## 2022-01-02 NOTE — Discharge Instructions (Signed)
Continue Tylenol and ibuprofen as needed for soreness.  Ice swollen areas tonight.  Your symptoms should improve after tomorrow.    If you have persistent discomfort or trouble breathing from your nasal bone fracture, there is a number below to call to set up a follow-up appointment with an ear, nose, and throat doctor.    Return to the emergency department at any time for any new concerning symptoms.
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
## 2022-02-10 ENCOUNTER — Other Ambulatory Visit: Payer: Self-pay

## 2022-02-10 MED ORDER — CARVEDILOL 6.25 MG PO TABS: 6.2500 mg | ORAL_TABLET | Freq: Two times a day (BID) | ORAL | 2 refills | Status: DC

## 2022-02-10 MED ORDER — ATORVASTATIN CALCIUM 40 MG PO TABS: ORAL_TABLET | ORAL | 2 refills | Status: DC

## 2022-02-10 MED ORDER — AMLODIPINE BESYLATE 10 MG PO TABS: 10.0000 mg | ORAL_TABLET | Freq: Every day | ORAL | 2 refills | Status: DC

## 2022-04-17 ENCOUNTER — Encounter: Payer: Self-pay | Admitting: *Deleted

## 2022-05-10 ENCOUNTER — Telehealth: Payer: Self-pay | Admitting: *Deleted

## 2022-05-10 NOTE — Telephone Encounter (Signed)
Pt left vm about scheduling a colonoscopy.  [place]

## 2022-05-17 ENCOUNTER — Encounter: Payer: Self-pay | Admitting: *Deleted

## 2022-05-17 NOTE — Patient Instructions (Signed)
  Procedure: Colonoscopy  Estimated body mass index is 25.7 kg/m as calculated from the following:   Height as of this encounter: '5\' 9"'$  (1.753 m).   Weight as of this encounter: 174 lb (78.9 kg).   Have you had a colonoscopy before?  Yes 04/09/19, Dr. Oneida Alar  Do you have family history of colon cancer  no  Do you have a family history of polyps? no  Previous colonoscopy with polyps removed? yes  Do you have a history colorectal cancer?   no  Are you diabetic?  no  Do you have a prosthetic or mechanical heart valve? no  Do you have a pacemaker/defibrillator?   no  Have you had endocarditis/atrial fibrillation?  no  Do you use supplemental oxygen/CPAP?  no  Have you had joint replacement within the last 12 months?  no  Do you tend to be constipated or have to use laxatives?  no   Do you have history of alcohol use? If yes, how much and how often.  no  Do you have history or are you using drugs? If yes, what do are you  using?  no  Have you ever had a stroke/heart attack?    Have you ever had a heart or other vascular stent placed,?  Do you take weight loss medication? no  Do you take any blood-thinning medications such as: (Plavix, aspirin, Coumadin, Aggrenox, Brilinta, Xarelto, Eliquis, Pradaxa, Savaysa or Effient) aspirin '81mg'$   If yes we need the name, milligram, dosage and who is prescribing doctor:  no             Current Outpatient Medications  Medication Sig Dispense Refill   amLODipine (NORVASC) 10 MG tablet Take 1 tablet (10 mg total) by mouth daily. 30 tablet 2   aspirin EC 81 MG tablet Take 81 mg by mouth daily.     atorvastatin (LIPITOR) 40 MG tablet TAKE 1 TABLET BY MOUTH  DAILY AT 6PM 30 tablet 2   B Complex Vitamins (B COMPLEX PO) Take by mouth daily.     CALCIUM PO Take by mouth daily.     carvedilol (COREG) 6.25 MG tablet Take 1 tablet (6.25 mg total) by mouth 2 (two) times daily with a meal. 60 tablet 2   Cholecalciferol (VITAMIN D3) 25 MCG (1000 UT)  CAPS Take 1,000 Units by mouth daily.      ferrous sulfate 325 (65 FE) MG tablet Take 325 mg by mouth daily with breakfast.     FIBER PO Take by mouth daily.     Multiple Vitamin (MULTIVITAMIN WITH MINERALS) TABS tablet Take 1 tablet by mouth daily.     nitroGLYCERIN (NITROSTAT) 0.4 MG SL tablet Place 1 tablet (0.4 mg total) under the tongue every 5 (five) minutes x 3 doses as needed for chest pain. For chest pains 25 tablet 0   Omega-3 Fatty Acids (FISH OIL) 1000 MG CAPS Take 1,000 mg by mouth daily.      POTASSIUM PO Take by mouth daily.     vitamin C (ASCORBIC ACID) 500 MG tablet Take 500 mg by mouth daily.     vitamin E 400 UNIT capsule Take 400 Units by mouth daily.     Zinc 50 MG TABS Take by mouth.     No current facility-administered medications for this visit.    No Known Allergies

## 2022-05-19 ENCOUNTER — Other Ambulatory Visit: Payer: Self-pay | Admitting: Cardiology

## 2022-05-23 ENCOUNTER — Telehealth: Payer: Self-pay

## 2022-05-23 DIAGNOSIS — I723 Aneurysm of iliac artery: Secondary | ICD-10-CM

## 2022-05-23 NOTE — Telephone Encounter (Signed)
Pt called stating that he has a groin aneurysm and is having pain.  Reviewed pt's chart, returned call for clarification, two identifiers used. Pt states that the pain is intermittent. He denies any leg swelling, but notes that his stomach feels bloated. He states that is not a new issue, but he feels as if something "is not quite right". Appts scheduled from recall early. Confirmed understanding.

## 2022-05-26 NOTE — Progress Notes (Signed)
Ok to schedule. ASA3  Hold iron 7 days.

## 2022-05-29 NOTE — Progress Notes (Signed)
LMOVM to call back 

## 2022-06-01 ENCOUNTER — Encounter: Payer: Self-pay | Admitting: Vascular Surgery

## 2022-06-01 ENCOUNTER — Ambulatory Visit (HOSPITAL_COMMUNITY)
Admission: RE | Admit: 2022-06-01 | Discharge: 2022-06-01 | Disposition: A | Discharge status: Home / Self Care | Payer: 59 | Source: Ambulatory Visit | Attending: Vascular Surgery | Admitting: Vascular Surgery

## 2022-06-01 ENCOUNTER — Ambulatory Visit (INDEPENDENT_AMBULATORY_CARE_PROVIDER_SITE_OTHER): Payer: 59 | Admitting: Vascular Surgery

## 2022-06-01 ENCOUNTER — Encounter: Payer: Self-pay | Admitting: *Deleted

## 2022-06-01 VITALS — BP 157/84 | HR 66 | Temp 97.7°F | Resp 18 | Ht 69.0 in | Wt 175.0 lb

## 2022-06-01 DIAGNOSIS — I723 Aneurysm of iliac artery: Secondary | ICD-10-CM

## 2022-06-01 NOTE — Progress Notes (Signed)
REASON FOR VISIT:   Follow-up of bilateral common iliac artery aneurysms.  MEDICAL ISSUES:   BILATERAL COMMON ILIAC ARTERY ANEURYSMS: There has been no change in the size of his bilateral common iliac artery aneurysms.  The right common iliac artery measures 2.4 cm in maximum diameter.  The left common iliac artery measures 2.2 cm in maximum diameter.  He understands we would normally not consider elective repair left the enlarged to 3.5 cm and a normal risk patient.  He did quit smoking which is excellent.  This was about 2 months ago.  He tells me that his blood pressure is under good control although today in the office it was slightly elevated.  I have ordered a follow-up duplex in 1 year and he will be seen on the PA schedule at that time.  He knows to call sooner if he has problems.  I reassured him that I did not think his lower abdominal pain could be attributed to his aneurysms which have been stable in size.  HPI:   Ruben Young is a pleasant 71 y.o. male who I last saw on 06/30/2021.  I have been following him with bilateral common iliac artery aneurysms.  At that time the aneurysms have been stable in size.  The right common iliac artery measured 2.3 cm in maximum diameter.  The left common iliac artery measured 1.1 cm in maximum diameter.  However previous study had shown it to be 2 cm back in 2021.  The patient was to be scheduled for an 64-monthfollow-up visit.  He comes in today because he has had some intermittent lower abdominal pain.  This is been going on for a month.  It comes and goes.  It is a vague lower abdominal pain.  He has had no sudden onset abdominal pain or back pain.  He quit smoking 1 to 2 months ago.  He tells me that his blood pressure is under good control.  He is on aspirin and is on a statin.  Past Medical History:  Diagnosis Date   AAA (abdominal aortic aneurysm) (HCC)    DDD (degenerative disc disease), lumbar    Hyperlipidemia     Hypertension    MI (myocardial infarction) ([facility]    Old myocardial infarction    Tobacco use disorder     Family History  Problem Relation Age of Onset   Cancer Mother    Heart attack Mother    Colon cancer Neg Hx     SOCIAL HISTORY: Social History   Tobacco Use   Smoking status: Every Day    Packs/day: 0.25    Years: 48.00    Total pack years: 12.00    Types: Cigarettes   Smokeless tobacco: Never   Tobacco comments:    1 pk lasts 3-4 days  Substance Use Topics   Alcohol use: No    Alcohol/week: 0.0 standard drinks of alcohol    No Known Allergies  Current Outpatient Medications  Medication Sig Dispense Refill   amLODipine (NORVASC) 10 MG tablet TAKE 1 TABLET(10 MG) BY MOUTH DAILY 90 tablet 3   aspirin EC 81 MG tablet Take 81 mg by mouth daily.     atorvastatin (LIPITOR) 40 MG tablet TAKE 1 TABLET BY MOUTH DAILY AT 6 PM 90 tablet 2   B Complex Vitamins (B COMPLEX PO) Take by mouth daily.     CALCIUM PO Take by mouth daily.     carvedilol (COREG) 6.25 MG tablet TAKE  1 TABLET(6.25 MG) BY MOUTH TWICE DAILY WITH A MEAL 180 tablet 2   Cholecalciferol (VITAMIN D3) 25 MCG (1000 UT) CAPS Take 1,000 Units by mouth daily.      ferrous sulfate 325 (65 FE) MG tablet Take 325 mg by mouth daily with breakfast.     FIBER PO Take by mouth daily.     Multiple Vitamin (MULTIVITAMIN WITH MINERALS) TABS tablet Take 1 tablet by mouth daily.     nitroGLYCERIN (NITROSTAT) 0.4 MG SL tablet Place 1 tablet (0.4 mg total) under the tongue every 5 (five) minutes x 3 doses as needed for chest pain. For chest pains 25 tablet 0   Omega-3 Fatty Acids (FISH OIL) 1000 MG CAPS Take 1,000 mg by mouth daily.      POTASSIUM PO Take by mouth daily.     vitamin C (ASCORBIC ACID) 500 MG tablet Take 500 mg by mouth daily.     vitamin E 400 UNIT capsule Take 400 Units by mouth daily.     Zinc 50 MG TABS Take by mouth.     No current facility-administered medications for this visit.    REVIEW OF SYSTEMS:   '[X]'$  denotes positive finding, '[ ]'$  denotes negative finding Cardiac  Comments:  Chest pain or chest pressure:    Shortness of breath upon exertion:    Short of breath when lying flat:    Irregular heart rhythm:        Vascular    Pain in calf, thigh, or hip brought on by ambulation:    Pain in feet at night that wakes you up from your sleep:     Blood clot in your veins:    Leg swelling:         Pulmonary    Oxygen at home:    Productive cough:     Wheezing:         Neurologic    Sudden weakness in arms or legs:     Sudden numbness in arms or legs:     Sudden onset of difficulty speaking or slurred speech:    Temporary loss of vision in one eye:     Problems with dizziness:         Gastrointestinal    Blood in stool:     Vomited blood:         Genitourinary    Burning when urinating:     Blood in urine:        Psychiatric    Major depression:         Hematologic    Bleeding problems:    Problems with blood clotting too easily:        Skin    Rashes or ulcers:        Constitutional    Fever or chills:     PHYSICAL EXAM:   Vitals:   06/01/22 0833  BP: (!) 157/84  Pulse: 66  Resp: 18  Temp: 97.7 F (36.5 C)  TempSrc: Temporal  SpO2: 95%  Weight: 175 lb (79.4 kg)  Height: '5\' 9"'$  (1.753 m)    GENERAL: The patient is a well-nourished male, in no acute distress. The vital signs are documented above. CARDIAC: There is a regular rate and rhythm.  VASCULAR: I do not detect carotid bruits. He has palpable femoral, dorsalis pedis, and posterior tibial pulses bilaterally. PULMONARY: There is good air exchange bilaterally without wheezing or rales. ABDOMEN: Soft and non-tender with normal pitched bowel sounds.  His abdomen is  somewhat distended but he has normal pitched bowel sounds. MUSCULOSKELETAL: There are no major deformities or cyanosis. NEUROLOGIC: No focal weakness or paresthesias are detected. SKIN: There are no ulcers or rashes noted. PSYCHIATRIC: The  patient has a normal affect.  DATA:    DUPLEX ABDOMINAL AORTA: I have independently interpreted his duplex of the abdominal aorta and iliac arteries.  The right common iliac artery measures 2.4 cm in maximum diameter.  The left common iliac artery measures 2.2 cm in maximum diameter.  Thus there has been no significant change in size bilaterally.   Deitra Mayo [facility] [phone]

## 2022-06-01 NOTE — Telephone Encounter (Signed)
Mailed letter to pt

## 2022-06-13 ENCOUNTER — Telehealth: Payer: Self-pay | Admitting: Internal Medicine

## 2022-06-13 NOTE — Telephone Encounter (Signed)
Patient said he brought the colonoscopy questionnaire last week and he is ready to get scheduled.

## 2022-06-21 ENCOUNTER — Encounter: Payer: Self-pay | Admitting: *Deleted

## 2022-06-21 MED ORDER — PEG 3350-KCL-NA BICARB-NACL 420 G PO SOLR: 4000.0000 mL | Freq: Once | ORAL | 0 refills | Status: AC

## 2022-06-21 NOTE — Progress Notes (Signed)
Pt called back. Schedled for 12/18 at 12:30pm. Aware will send instructions/pre-op appt. Rx for prep sent to pharmacy and also aware ot hold iron x 7 days.

## 2022-06-21 NOTE — Progress Notes (Signed)
PA approved via [employer] Auth# [id number], dos:Dec 60, 2023 - Jul 30, 2022

## 2022-06-21 NOTE — Progress Notes (Signed)
From recall list, no referral needed

## 2022-06-27 ENCOUNTER — Telehealth: Payer: Self-pay | Admitting: Cardiology

## 2022-06-27 NOTE — Telephone Encounter (Signed)
Follow Up:    I did make patient an appointment for 07-04-22 with Ruben Young. I offered patient a sooner appointment. He said he needs  a week to get transportation to bring him.

## 2022-06-27 NOTE — Telephone Encounter (Signed)
Pt c/o Shortness Of Breath: STAT if SOB developed within the last 24 hours or pt is noticeably SOB on the phone  1. Are you currently SOB (can you hear that pt is SOB on the phone)? A little, could not hear it over the phone  2. How long have you been experiencing SOB?  About a month, getting really worse  3. Are you SOB when sitting or when up moving around? Moving around and going up steps  4. Are you currently experiencing any other symptoms? no

## 2022-06-27 NOTE — Telephone Encounter (Signed)
Spoke with pt is who c/o SOB with activity.  He reports he gets SOB walking up or down stairs.  He walked down the hall to his bathroom to weigh while on the phone and became very SOB.  He denies any cough.  Reports his wt is up to 174 lbs which is the most he has ever weighed.  Does not weigh daily.  Advised to weigh every day in the am in the same amount of clothing and to keep a log of it.  Advised to notify the office if wt goes up overnight 2 to 3 lbs or 5 lbs in a week.  He does not have any diuretic on hand at home.  He does not have any edema at feet/legs but has noticed his pants are tighter and belly feels bloated.  These s/s have been getting worse of the last 2 to 3 months.  He recently saw his PCP who checked he 02 sats in office.  At rest 02 was 96%, after walking it dropped to 84%.  Pt reports PCP ordered him an inhaler but it has not helped.  PCP also ordered pt to have CXR but pt has never but instructed on where to have it.  Advised to contact PCP office to ask again about CXR.  Pt state understanding.  Reviewed foods high in sodium and advised pt to refrain from any prepared foods, any fast foods, deli meat, hot dogs etc.  He admits to eating those kinds of foods but will refrain.  Pt will call back if s/s become worse before his scheduled appt.  He is unable to come into office any sooner d/t transportation.  Pt was appreciative of the call back if any questions/concerns or if s/s worsen.

## 2022-06-30 ENCOUNTER — Emergency Department (HOSPITAL_COMMUNITY): Payer: 59

## 2022-06-30 ENCOUNTER — Inpatient Hospital Stay (HOSPITAL_COMMUNITY)
Admission: EM | Admit: 2022-06-30 | Discharge: 2022-07-03 | DRG: 175 | Disposition: A | Discharge status: Home / Self Care | Payer: 59 | Attending: Internal Medicine | Admitting: Internal Medicine

## 2022-06-30 ENCOUNTER — Other Ambulatory Visit (HOSPITAL_COMMUNITY): Payer: Self-pay | Admitting: Nurse Practitioner

## 2022-06-30 ENCOUNTER — Other Ambulatory Visit: Payer: Self-pay

## 2022-06-30 ENCOUNTER — Encounter (HOSPITAL_COMMUNITY): Payer: Self-pay | Admitting: *Deleted

## 2022-06-30 DIAGNOSIS — I110 Hypertensive heart disease with heart failure: Secondary | ICD-10-CM | POA: Diagnosis present

## 2022-06-30 DIAGNOSIS — Z87891 Personal history of nicotine dependence: Secondary | ICD-10-CM

## 2022-06-30 DIAGNOSIS — F17210 Nicotine dependence, cigarettes, uncomplicated: Secondary | ICD-10-CM | POA: Diagnosis present

## 2022-06-30 DIAGNOSIS — I82432 Acute embolism and thrombosis of left popliteal vein: Secondary | ICD-10-CM | POA: Diagnosis present

## 2022-06-30 DIAGNOSIS — I714 Abdominal aortic aneurysm, without rupture, unspecified: Secondary | ICD-10-CM | POA: Diagnosis present

## 2022-06-30 DIAGNOSIS — J9601 Acute respiratory failure with hypoxia: Secondary | ICD-10-CM | POA: Diagnosis present

## 2022-06-30 DIAGNOSIS — E785 Hyperlipidemia, unspecified: Secondary | ICD-10-CM | POA: Diagnosis present

## 2022-06-30 DIAGNOSIS — J439 Emphysema, unspecified: Secondary | ICD-10-CM | POA: Diagnosis present

## 2022-06-30 DIAGNOSIS — I2699 Other pulmonary embolism without acute cor pulmonale: Principal | ICD-10-CM | POA: Diagnosis present

## 2022-06-30 DIAGNOSIS — I255 Ischemic cardiomyopathy: Secondary | ICD-10-CM | POA: Diagnosis present

## 2022-06-30 DIAGNOSIS — I5022 Chronic systolic (congestive) heart failure: Secondary | ICD-10-CM | POA: Diagnosis present

## 2022-06-30 DIAGNOSIS — Z8249 Family history of ischemic heart disease and other diseases of the circulatory system: Secondary | ICD-10-CM | POA: Diagnosis not present

## 2022-06-30 DIAGNOSIS — Z7982 Long term (current) use of aspirin: Secondary | ICD-10-CM

## 2022-06-30 DIAGNOSIS — Z20822 Contact with and (suspected) exposure to covid-19: Secondary | ICD-10-CM | POA: Diagnosis present

## 2022-06-30 DIAGNOSIS — Z79899 Other long term (current) drug therapy: Secondary | ICD-10-CM

## 2022-06-30 DIAGNOSIS — I252 Old myocardial infarction: Secondary | ICD-10-CM | POA: Diagnosis not present

## 2022-06-30 DIAGNOSIS — I10 Essential (primary) hypertension: Secondary | ICD-10-CM | POA: Diagnosis not present

## 2022-06-30 DIAGNOSIS — I2609 Other pulmonary embolism with acute cor pulmonale: Secondary | ICD-10-CM | POA: Diagnosis not present

## 2022-06-30 LAB — BLOOD GAS, ARTERIAL
Acid-Base Excess: 5.9 mmol/L — ABNORMAL HIGH (ref 0.0–2.0)
Bicarbonate: 29.7 mmol/L — ABNORMAL HIGH (ref 20.0–28.0)
Drawn by: 27407
O2 Saturation: 84.7 %
Patient temperature: 36.6
pCO2 arterial: 38 mmHg (ref 32–48)
pH, Arterial: 7.5 — ABNORMAL HIGH (ref 7.35–7.45)
pO2, Arterial: 51 mmHg — ABNORMAL LOW (ref 83–108)

## 2022-06-30 LAB — CBC WITH DIFFERENTIAL/PLATELET
Abs Immature Granulocytes: 0.03 10*3/uL (ref 0.00–0.07)
Basophils Absolute: 0.1 10*3/uL (ref 0.0–0.1)
Basophils Relative: 1 %
Eosinophils Absolute: 0.3 10*3/uL (ref 0.0–0.5)
Eosinophils Relative: 3 %
HCT: 43.4 % (ref 39.0–52.0)
Hemoglobin: 14.5 g/dL (ref 13.0–17.0)
Immature Granulocytes: 0 %
Lymphocytes Relative: 24 %
Lymphs Abs: 2.6 10*3/uL (ref 0.7–4.0)
MCH: 29.3 pg (ref 26.0–34.0)
MCHC: 33.4 g/dL (ref 30.0–36.0)
MCV: 87.7 fL (ref 80.0–100.0)
Monocytes Absolute: 0.9 10*3/uL (ref 0.1–1.0)
Monocytes Relative: 8 %
Neutro Abs: 7 10*3/uL (ref 1.7–7.7)
Neutrophils Relative %: 64 %
Platelets: 216 10*3/uL (ref 150–400)
RBC: 4.95 MIL/uL (ref 4.22–5.81)
RDW: 14.7 % (ref 11.5–15.5)
WBC: 10.9 10*3/uL — ABNORMAL HIGH (ref 4.0–10.5)
nRBC: 0 % (ref 0.0–0.2)

## 2022-06-30 LAB — RESP PANEL BY RT-PCR (FLU A&B, COVID) ARPGX2
Influenza A by PCR: NEGATIVE
Influenza B by PCR: NEGATIVE
SARS Coronavirus 2 by RT PCR: NEGATIVE

## 2022-06-30 LAB — HEPATIC FUNCTION PANEL
ALT: 15 U/L (ref 0–44)
AST: 19 U/L (ref 15–41)
Albumin: 3.3 g/dL — ABNORMAL LOW (ref 3.5–5.0)
Alkaline Phosphatase: 86 U/L (ref 38–126)
Bilirubin, Direct: 0.1 mg/dL (ref 0.0–0.2)
Indirect Bilirubin: 0.3 mg/dL (ref 0.3–0.9)
Total Bilirubin: 0.4 mg/dL (ref 0.3–1.2)
Total Protein: 7.4 g/dL (ref 6.5–8.1)

## 2022-06-30 LAB — URINALYSIS, ROUTINE W REFLEX MICROSCOPIC
Bilirubin Urine: NEGATIVE
Glucose, UA: NEGATIVE mg/dL
Hgb urine dipstick: NEGATIVE
Ketones, ur: NEGATIVE mg/dL
Nitrite: NEGATIVE
Protein, ur: 30 mg/dL — AB
Specific Gravity, Urine: 1.013 (ref 1.005–1.030)
pH: 6 (ref 5.0–8.0)

## 2022-06-30 LAB — BASIC METABOLIC PANEL
Anion gap: 9 (ref 5–15)
BUN: 9 mg/dL (ref 8–23)
CO2: 24 mmol/L (ref 22–32)
Calcium: 8.7 mg/dL — ABNORMAL LOW (ref 8.9–10.3)
Chloride: 106 mmol/L (ref 98–111)
Creatinine, Ser: 1.01 mg/dL (ref 0.61–1.24)
GFR, Estimated: >60 mL/min (ref >60)
Glucose, Bld: 94 mg/dL (ref 70–99)
Potassium: 3.3 mmol/L — ABNORMAL LOW (ref 3.5–5.1)
Sodium: 139 mmol/L (ref 135–145)

## 2022-06-30 LAB — BRAIN NATRIURETIC PEPTIDE: B Natriuretic Peptide: 34 pg/mL (ref 0.0–100.0)

## 2022-06-30 LAB — PROTIME-INR
INR: 1.1 (ref 0.8–1.2)
Prothrombin Time: 13.7 seconds (ref 11.4–15.2)

## 2022-06-30 LAB — TROPONIN I (HIGH SENSITIVITY): Troponin I (High Sensitivity): 10 ng/L (ref <18)

## 2022-06-30 MED ORDER — ENOXAPARIN SODIUM 80 MG/0.8ML IJ SOSY
1.0000 mg/kg | PREFILLED_SYRINGE | Freq: Two times a day (BID) | INTRAMUSCULAR | Status: DC
  Administered 2022-06-30 – 2022-07-01 (×2): 80 mg via SUBCUTANEOUS
  Filled 2022-06-30 (×2): qty 0.8

## 2022-06-30 MED ORDER — AMLODIPINE BESYLATE 5 MG PO TABS
10.0000 mg | ORAL_TABLET | Freq: Every day | ORAL | Status: DC
  Administered 2022-07-01 – 2022-07-03 (×3): 10 mg via ORAL
  Filled 2022-06-30 (×3): qty 2

## 2022-06-30 MED ORDER — NITROGLYCERIN 0.4 MG SL SUBL
0.4000 mg | SUBLINGUAL_TABLET | Freq: Once | SUBLINGUAL | Status: AC
  Administered 2022-06-30: 0.4 mg via SUBLINGUAL
  Filled 2022-06-30: qty 1

## 2022-06-30 MED ORDER — ONDANSETRON HCL 4 MG PO TABS: 4.0000 mg | ORAL_TABLET | Freq: Four times a day (QID) | ORAL | Status: DC | PRN

## 2022-06-30 MED ORDER — ASPIRIN 325 MG PO TABS
325.0000 mg | ORAL_TABLET | Freq: Once | ORAL | Status: AC
  Administered 2022-06-30: 325 mg via ORAL
  Filled 2022-06-30: qty 1

## 2022-06-30 MED ORDER — ASPIRIN 81 MG PO TBEC
81.0000 mg | DELAYED_RELEASE_TABLET | Freq: Every day | ORAL | Status: DC
  Administered 2022-07-01 – 2022-07-03 (×3): 81 mg via ORAL
  Filled 2022-06-30 (×3): qty 1

## 2022-06-30 MED ORDER — ONDANSETRON HCL 4 MG/2ML IJ SOLN: 4.0000 mg | Freq: Four times a day (QID) | INTRAMUSCULAR | Status: DC | PRN

## 2022-06-30 MED ORDER — CARVEDILOL 3.125 MG PO TABS
6.2500 mg | ORAL_TABLET | Freq: Two times a day (BID) | ORAL | Status: DC
  Administered 2022-06-30 – 2022-07-03 (×6): 6.25 mg via ORAL
  Filled 2022-06-30 (×6): qty 2

## 2022-06-30 MED ORDER — IOHEXOL 350 MG/ML SOLN
75.0000 mL | Freq: Once | INTRAVENOUS | Status: AC | PRN
  Administered 2022-06-30: 75 mL via INTRAVENOUS

## 2022-06-30 NOTE — H&P (Addendum)
History and Physical    Patient: Ruben Young BHA:[id number] DOB: 1951-01-08 DOA: 06/30/2022 DOS: the patient was seen and examined on 06/30/2022 PCP: Iona Beard, MD  Patient coming from: Home  Chief Complaint:  Chief Complaint  Patient presents with   Shortness of Breath   HPI: Ruben Young is a 71 y.o. male with medical history significant of hypertension, hyperlipidemia, history of MI, abdominal aortic aneurysm.  Patient seen for 2 weeks of worsening shortness of breath that is worse with exertion and improved with rest.  The patient went to see his primary care physician who noted that he was acutely hypoxic and sent him to the hospital for evaluation.  He denies chest pain, leg pain, lower extremities swelling, weight loss, weight gain.  No personal history of DVT or PE.  No family history of hypercoagulability.  Review of Systems: As mentioned in the history of present illness. All other systems reviewed and are negative. Past Medical History:  Diagnosis Date   AAA (abdominal aortic aneurysm) (HCC)    DDD (degenerative disc disease), lumbar    Hyperlipidemia    Hypertension    MI (myocardial infarction) ([place])    Old myocardial infarction    Tobacco use disorder    Past Surgical History:  Procedure Laterality Date   COLONOSCOPY N/A 10/01/2015   Procedure: COLONOSCOPY;  Surgeon: Danie Binder, MD;  Location: [facility];  Service: Endoscopy;  Laterality: N/A;  245   COLONOSCOPY N/A 04/09/2019   Procedure: COLONOSCOPY;  Surgeon: Danie Binder, MD;  Location: [facility];  Service: Endoscopy;  Laterality: N/A;  12:00   CORONARY ANGIOPLASTY WITH STENT PLACEMENT     EXCISION MASS LOWER EXTREMETIES Left 01/28/2016   Procedure: EXCISION LEFT BUTTOCKS SINUS TRACH;  Surgeon: Alphonsa Overall, MD;  Location: [facility];  Service: General;  Laterality: Left;   LEFT HEART CATHETERIZATION WITH CORONARY ANGIOGRAM N/A 09/24/2012   Procedure: LEFT HEART CATHETERIZATION WITH CORONARY  ANGIOGRAM;  Surgeon: Jettie Booze, MD;  Location: [facility] CATH LAB;  Service: Cardiovascular;  Laterality: N/A;   POLYPECTOMY  04/09/2019   Procedure: POLYPECTOMY;  Surgeon: Danie Binder, MD;  Location: [facility];  Service: Endoscopy;;   TONSILLECTOMY     Social History:  reports that he has been smoking cigarettes. He has a 12.00 pack-year smoking history. He has never used smokeless tobacco. He reports that he does not drink alcohol and does not use drugs.  No Known Allergies  Family History  Problem Relation Age of Onset   Cancer Mother    Heart attack Mother    Colon cancer Neg Hx     Prior to Admission medications   Medication Sig Start Date End Date Taking? Authorizing Provider  amLODipine (NORVASC) 10 MG tablet TAKE 1 TABLET(10 MG) BY MOUTH DAILY Patient taking differently: Take 10 mg by mouth daily. 05/19/22  Yes Jerline Pain, MD  aspirin EC 81 MG tablet Take 81 mg by mouth daily.   Yes [provider]  atorvastatin (LIPITOR) 40 MG tablet TAKE 1 TABLET BY MOUTH DAILY AT 6 PM Patient taking differently: Take 40 mg by mouth every evening. TAKE 1 TABLET BY MOUTH DAILY AT 6 PM 05/19/22  Yes Jerline Pain, MD  B Complex Vitamins (B COMPLEX PO) Take 1 capsule by mouth every other day.   Yes [provider]  CALCIUM PO Take 1 tablet by mouth every other day.   Yes [provider]  carvedilol (COREG) 6.25  MG tablet TAKE 1 TABLET(6.25 MG) BY MOUTH TWICE DAILY WITH A MEAL Patient taking differently: Take 6.25 mg by mouth 2 (two) times daily with a meal. 05/19/22  Yes Skains, Thana Farr, MD  Cholecalciferol (VITAMIN D3) 25 MCG (1000 UT) CAPS Take 1,000 Units by mouth daily.    Yes [provider]  GINSENG PO Take 1 capsule by mouth every other day.   Yes [provider]  Multiple Vitamin (MULTIVITAMIN WITH MINERALS) TABS tablet Take 1 tablet by mouth daily.   Yes [provider]  nitroGLYCERIN (NITROSTAT) 0.4 MG SL tablet Place 1  tablet (0.4 mg total) under the tongue every 5 (five) minutes x 3 doses as needed for chest pain. For chest pains 05/05/21  Yes Jerline Pain, MD  Omega-3 Fatty Acids (FISH OIL) 1000 MG CAPS Take 1,000 mg by mouth daily.    Yes [provider]  POTASSIUM PO Take 1 tablet by mouth every other day.   Yes [provider]  vitamin C (ASCORBIC ACID) 500 MG tablet Take 500 mg by mouth daily.   Yes [provider]  vitamin E 400 UNIT capsule Take 400 Units by mouth daily.   Yes [provider]    Physical Exam: Vitals:   06/30/22 1442 06/30/22 1455 06/30/22 1530 06/30/22 1730  BP: (!) 148/82  132/83 (!) 140/83  Pulse: 87  77 72  Resp: 20  (!) 22 (!) 25  Temp: 98 F (36.7 C)     TempSrc: Oral     SpO2: 98%  96% 94%  Weight:  78.9 kg    Height:  '5\' 9"'$  (1.753 m)     General: Elderly male. Awake and alert and oriented x3. No acute cardiopulmonary distress.  HEENT: Normocephalic atraumatic.  Right and left ears normal in appearance.  Pupils equal, round, reactive to light. Extraocular muscles are intact. Sclerae anicteric and noninjected.  Moist mucosal membranes. No mucosal lesions.  Neck: Neck supple without lymphadenopathy. No carotid bruits. No masses palpated.  Cardiovascular: Regular rate with normal S1-S2 sounds. No murmurs, rubs, gallops auscultated. No JVD.  Respiratory: Good respiratory effort with no wheezes, rales, rhonchi. Lungs clear to auscultation bilaterally.  No accessory muscle use. Abdomen: Soft, nontender, nondistended. Active bowel sounds. No masses or hepatosplenomegaly  Skin: No rashes, lesions, or ulcerations.  Dry, warm to touch. 2+ dorsalis pedis and radial pulses. Musculoskeletal: No calf or leg pain. All major joints not erythematous nontender.  No upper or lower joint deformation.  Good ROM.  No contractures  Psychiatric: Intact judgment and insight. Pleasant and cooperative. Neurologic: No focal neurological deficits. Strength is 5/5  and symmetric in upper and lower extremities.  Cranial nerves II through XII are grossly intact.  Data Reviewed: Results for orders placed or performed during the hospital encounter of 06/30/22 (from the past 24 hour(s))  Basic metabolic panel     Status: Abnormal   Collection Time: 06/30/22  2:39 PM  Result Value Ref Range   Sodium 139 135 - 145 mmol/L   Potassium 3.3 (L) 3.5 - 5.1 mmol/L   Chloride 106 98 - 111 mmol/L   CO2 24 22 - 32 mmol/L   Glucose, Bld 94 70 - 99 mg/dL   BUN 9 8 - 23 mg/dL   Creatinine, Ser 1.01 0.61 - 1.24 mg/dL   Calcium 8.7 (L) 8.9 - 10.3 mg/dL   GFR, Estimated >60 >60 mL/min   Anion gap 9 5 - 15  CBC with Differential  Status: Abnormal   Collection Time: 06/30/22  2:39 PM  Result Value Ref Range   WBC 10.9 (H) 4.0 - 10.5 K/uL   RBC 4.95 4.22 - 5.81 MIL/uL   Hemoglobin 14.5 13.0 - 17.0 g/dL   HCT 43.4 39.0 - 52.0 %   MCV 87.7 80.0 - 100.0 fL   MCH 29.3 26.0 - 34.0 pg   MCHC 33.4 30.0 - 36.0 g/dL   RDW 14.7 11.5 - 15.5 %   Platelets 216 150 - 400 K/uL   nRBC 0.0 0.0 - 0.2 %   Neutrophils Relative % 64 %   Neutro Abs 7.0 1.7 - 7.7 K/uL   Lymphocytes Relative 24 %   Lymphs Abs 2.6 0.7 - 4.0 K/uL   Monocytes Relative 8 %   Monocytes Absolute 0.9 0.1 - 1.0 K/uL   Eosinophils Relative 3 %   Eosinophils Absolute 0.3 0.0 - 0.5 K/uL   Basophils Relative 1 %   Basophils Absolute 0.1 0.0 - 0.1 K/uL   Immature Granulocytes 0 %   Abs Immature Granulocytes 0.03 0.00 - 0.07 K/uL  Hepatic function panel     Status: Abnormal   Collection Time: 06/30/22  2:39 PM  Result Value Ref Range   Total Protein 7.4 6.5 - 8.1 g/dL   Albumin 3.3 (L) 3.5 - 5.0 g/dL   AST 19 15 - 41 U/L   ALT 15 0 - 44 U/L   Alkaline Phosphatase 86 38 - 126 U/L   Total Bilirubin 0.4 0.3 - 1.2 mg/dL   Bilirubin, Direct 0.1 0.0 - 0.2 mg/dL   Indirect Bilirubin 0.3 0.3 - 0.9 mg/dL  Brain natriuretic peptide     Status: None   Collection Time: 06/30/22  3:14 PM  Result Value Ref Range    B Natriuretic Peptide 34.0 0.0 - 100.0 pg/mL  Troponin I (High Sensitivity)     Status: None   Collection Time: 06/30/22  3:26 PM  Result Value Ref Range   Troponin I (High Sensitivity) 10 <18 ng/L  Resp Panel by RT-PCR (Flu A&B, Covid) Anterior Nasal Swab     Status: None   Collection Time: 06/30/22  3:26 PM   Specimen: Anterior Nasal Swab  Result Value Ref Range   SARS Coronavirus 2 by RT PCR NEGATIVE NEGATIVE   Influenza A by PCR NEGATIVE NEGATIVE   Influenza B by PCR NEGATIVE NEGATIVE  Urinalysis, Routine w reflex microscopic Urine, Clean Catch     Status: Abnormal   Collection Time: 06/30/22  4:30 PM  Result Value Ref Range   Color, Urine YELLOW YELLOW   APPearance CLEAR CLEAR   Specific Gravity, Urine 1.013 1.005 - 1.030   pH 6.0 5.0 - 8.0   Glucose, UA NEGATIVE NEGATIVE mg/dL   Hgb urine dipstick NEGATIVE NEGATIVE   Bilirubin Urine NEGATIVE NEGATIVE   Ketones, ur NEGATIVE NEGATIVE mg/dL   Protein, ur 30 (A) NEGATIVE mg/dL   Nitrite NEGATIVE NEGATIVE   Leukocytes,Ua TRACE (A) NEGATIVE   RBC / HPF 0-5 0 - 5 RBC/hpf   WBC, UA 6-10 0 - 5 WBC/hpf   Bacteria, UA RARE (A) NONE SEEN   Squamous Epithelial / LPF 0-5 0 - 5   Mucus PRESENT   Blood gas, arterial (at [facility])     Status: Abnormal   Collection Time: 06/30/22  5:51 PM  Result Value Ref Range   pH, Arterial 7.5 (H) 7.35 - 7.45   pCO2 arterial 38 32 - 48 mmHg  pO2, Arterial 51 (L) 83 - 108 mmHg   Bicarbonate 29.7 (H) 20.0 - 28.0 mmol/L   Acid-Base Excess 5.9 (H) 0.0 - 2.0 mmol/L   O2 Saturation 84.7 %   Patient temperature 36.6    Collection site RIGHT RADIAL    Drawn by [id number]    Allens test (pass/fail) PASS PASS   CT Angio Chest PE W and/or Wo Contrast  Result Date: 06/30/2022 CLINICAL DATA:  Concern for pulmonary embolism. EXAM: CT ANGIOGRAPHY CHEST WITH CONTRAST TECHNIQUE: Multidetector CT imaging of the chest was performed using the standard protocol during bolus administration of intravenous contrast.  Multiplanar CT image reconstructions and MIPs were obtained to evaluate the vascular anatomy. RADIATION DOSE REDUCTION: This exam was performed according to the departmental dose-optimization program which includes automated exposure control, adjustment of the mA and/or kV according to patient size and/or use of iterative reconstruction technique. CONTRAST:  36m OMNIPAQUE IOHEXOL 350 MG/ML SOLN COMPARISON:  Chest radiograph dated 06/30/2022. FINDINGS: Evaluation of this exam is limited due to respiratory motion artifact. Cardiovascular: There is no cardiomegaly or pericardial effusion. There is coronary vascular calcification of the LAD and RCA. Mild atherosclerotic calcification of the thoracic aorta. No aneurysmal dilatation or dissection. The origins of the great vessels of the aortic arch appear patent as visualized. Evaluation of the pulmonary arteries is limited due to respiratory motion. Bilateral upper, and lower lobe pulmonary artery emboli involving the segmental and subsegmental branches. The right lower lobe pulmonary artery embolus extends from the lobar branch into the segmental and subsegmental branches. No CT evidence of right heart straining. Mediastinum/Nodes: No hilar or mediastinal adenopathy. The esophagus is grossly unremarkable. No mediastinal fluid collection. Lungs/Pleura: Background of emphysema. Areas of increased interstitial coarsening predominantly involving the left upper lobe may be chronic or due to atelectasis. Developing atypical infiltrate is not excluded. No consolidative changes. There is no pleural effusion or pneumothorax. The central airways are patent. No discrete nodule identified to correspond to the nodular shadow seen on the earlier radiograph. Upper Abdomen: No acute abnormality. Musculoskeletal: No acute osseous pathology. Review of the MIP images confirms the above findings. IMPRESSION: 1. Bilateral pulmonary artery emboli. No CT evidence of right heart straining. 2.  Areas of increased interstitial coarsening predominantly involving the left upper lobe may be chronic or due to atelectasis. Developing atypical infiltrate is not excluded. 3. Aortic Atherosclerosis (ICD10-I70.0) and Emphysema (ICD10-J43.9). These results were called by telephone at the time of interpretation on 06/30/2022 at 5:55 pm to Dr. BNoemi Chapel who verbally acknowledged these results. Electronically Signed   By: AAnner CreteM.D.   On: 06/30/2022 17:56   DG Chest Port 1 View  Result Date: 06/30/2022 CLINICAL DATA:  Worsening shortness of breath over several months EXAM: PORTABLE CHEST 1 VIEW COMPARISON:  09/24/2012 FINDINGS: Single frontal view of the chest demonstrates an unremarkable cardiac silhouette. There is increased bilateral interstitial prominence, with patchy areas of airspace disease most pronounced in the perihilar regions left greater than right. 9 mm nodular area projects over the right anterior fifth intercostal space, which could reflect airspace disease or focal lung nodule. No effusion or pneumothorax. No acute bony abnormalities. IMPRESSION: 1. Increased interstitial and bilateral perihilar ground-glass airspace disease, favor atypical pneumonia over edema. 2. 9 mm nodular area of projecting over the right anterior fifth intercostal space. Differential diagnosis would include nipple shadow, nodular airspace disease, or underlying parenchymal lung nodule. Follow-up chest x-ray with nipple markers in 6-8 weeks after completion of appropriate medical management  is recommended to document resolution. If this density persists at that time, and is not representative of a nipple shadow, CT chest could be performed. Electronically Signed   By: Randa Ngo M.D.   On: 06/30/2022 15:38     Assessment and Plan: No notes have been filed under this hospital service. Service: Hospitalist  Principal Problem:   Acute pulmonary embolism (HCC) Active Problems:   Cardiomyopathy,  ischemic   Chronic systolic heart failure (HCC)   Essential hypertension   Acute respiratory failure with hypoxia (HCC)  Acute respiratory failure with hypoxia secondary to acute pulmonary embolism Admit Lovenox 1 mg/kg twice daily Echocardiogram Check INR EKG shows sinus rhythm, however will leave on telemetry monitoring and check echocardiogram Chronic systolic heart failure Currently compensated Ischemic cardiomyopathy with history of MI Continue baby aspirin, beta-blocker Hypertension Continue blood pressure control   Advance Care Planning:   Code Status: Prior full code  Consults: None  Family Communication: None  Severity of Illness: The appropriate patient status for this patient is INPATIENT. Inpatient status is judged to be reasonable and necessary in order to provide the required intensity of service to ensure the patient's safety. The patient's presenting symptoms, physical exam findings, and initial radiographic and laboratory data in the context of their chronic comorbidities is felt to place them at high risk for further clinical deterioration. Furthermore, it is not anticipated that the patient will be medically stable for discharge from the hospital within 2 midnights of admission.   * I certify that at the point of admission it is my clinical judgment that the patient will require inpatient hospital care spanning beyond 2 midnights from the point of admission due to high intensity of service, high risk for further deterioration and high frequency of surveillance required.*  Author: Truett Mainland, DO 06/30/2022 7:52 PM  For on call review [email or web address].

## 2022-06-30 NOTE — ED Notes (Signed)
Pt ambulated to bathroom w/o assistance (less than 10'). Patient was taken off of o2. Upon returning to his bed, o2 saturation was noted to be 80%

## 2022-06-30 NOTE — ED Provider Notes (Signed)
Medical screening examination/treatment/procedure(s) were conducted as a shared visit with non-physician practitioner(s) and myself.  I personally evaluated the patient during the encounter.  Clinical Impression:   Final diagnoses:  None    Pt with SOB X 1 year or more Has had worsening DOE recently 84% in PCP office in last week or so Cards is going to see in 4 days Has no lung hx - has CAD hx X 2 MI Lungs clear but is dyspneic No edema, no JVD  Pt agreeable to plan  EKG unremarkable.   EKG Interpretation  Date/Time:  Friday June 30 2022 14:44:45 EST Ventricular Rate:  81 PR Interval:  136 QRS Duration: 90 QT Interval:  380 QTC Calculation: 442 R Axis:   88 Text Interpretation: Sinus rhythm Borderline right axis deviation Borderline T abnormalities, lateral leads Confirmed by Noemi Chapel [phone]) on 06/30/2022 3:29:39 PM          Noemi Chapel, MD 07/03/22 1106

## 2022-06-30 NOTE — ED Provider Notes (Addendum)
[facility] EMERGENCY DEPARTMENT Provider Note   CSN: [id number] Arrival date & time: 06/30/22  1423     History  Chief Complaint  Patient presents with   Shortness of Breath    Ruben Young is a 71 y.o. male with past medical history hypertension, hyperlipidemia, MI x 2 with last in 2014 who presents to the ED complaining of progressively worsening shortness of breath for the last year.  He notes that with exertion for the last week his symptoms have been particularly worse.  He went to his PCP for this last week and with an ambulatory 5-minute walk test he immediately dropped from 96% at rest to the low 80s on room air.  He states that PCP discussed with him potential need for home oxygen but this was not initiated at this visit.  He has a chronic cough and orthopnea that requires 3 pillows to sleep.  He denies chest pain, fever, chills, back pain, abdominal pain, nausea, vomiting, diarrhea, dizziness, syncope, history of lung disease, history of heart failure.  He is a former 1/2 pack/day smoker and quit 3 months ago.  He made his cardiologist aware of his recent breathing difficulties and they were "concerned he had fluid on him" so referred him to ED for evaluation. He denies recent travel, history of DVT/PE, leg swelling or pain, or history of arrhythmia. He does note he has been more sedentary lately due to his dyspnea.      Home Medications Prior to Admission medications   Medication Sig Start Date End Date Taking? Authorizing Provider  amLODipine (NORVASC) 10 MG tablet TAKE 1 TABLET(10 MG) BY MOUTH DAILY 05/19/22   Jerline Pain, MD  aspirin EC 81 MG tablet Take 81 mg by mouth daily.    [provider]  atorvastatin (LIPITOR) 40 MG tablet TAKE 1 TABLET BY MOUTH DAILY AT 6 PM 05/19/22   Jerline Pain, MD  B Complex Vitamins (B COMPLEX PO) Take by mouth daily.    [provider]  CALCIUM PO Take by mouth daily.    [provider]  carvedilol (COREG)  6.25 MG tablet TAKE 1 TABLET(6.25 MG) BY MOUTH TWICE DAILY WITH A MEAL 05/19/22   Jerline Pain, MD  Cholecalciferol (VITAMIN D3) 25 MCG (1000 UT) CAPS Take 1,000 Units by mouth daily.     [provider]  ferrous sulfate 325 (65 FE) MG tablet Take 325 mg by mouth daily with breakfast.    [provider]  FIBER PO Take by mouth daily.    [provider]  Multiple Vitamin (MULTIVITAMIN WITH MINERALS) TABS tablet Take 1 tablet by mouth daily.    [provider]  nitroGLYCERIN (NITROSTAT) 0.4 MG SL tablet Place 1 tablet (0.4 mg total) under the tongue every 5 (five) minutes x 3 doses as needed for chest pain. For chest pains 05/05/21   Jerline Pain, MD  Omega-3 Fatty Acids (FISH OIL) 1000 MG CAPS Take 1,000 mg by mouth daily.     [provider]  POTASSIUM PO Take by mouth daily.    [provider]  vitamin C (ASCORBIC ACID) 500 MG tablet Take 500 mg by mouth daily.    [provider]  vitamin E 400 UNIT capsule Take 400 Units by mouth daily.    [provider]  Zinc 50 MG TABS Take by mouth.    [provider]      Allergies    Patient has no known  allergies.    Review of Systems   Review of Systems  Constitutional:  Positive for activity change. Negative for appetite change, chills, diaphoresis, fatigue and fever.  HENT:  Negative for congestion, ear pain, rhinorrhea, sinus pressure, sore throat and trouble swallowing.   Eyes:  Negative for pain and visual disturbance.  Respiratory:  Positive for cough and shortness of breath. Negative for apnea, choking, chest tightness and wheezing.   Cardiovascular:  Negative for chest pain, palpitations and leg swelling.  Gastrointestinal:  Negative for abdominal pain, diarrhea, nausea and vomiting.  Genitourinary:  Negative for difficulty urinating, dysuria and hematuria.  Musculoskeletal:  Negative for arthralgias, back pain and gait problem.  Skin:  Negative for color  change and rash.  Neurological:  Negative for dizziness, seizures, syncope, speech difficulty, weakness, light-headedness, numbness and headaches.  All other systems reviewed and are negative.   Physical Exam Updated Vital Signs BP (!) 140/83   Pulse 72   Temp 98 F (36.7 C) (Oral)   Resp (!) 25   Ht '5\' 9"'$  (1.753 m)   Wt 78.9 kg   SpO2 94%   BMI 25.70 kg/m  Physical Exam Vitals and nursing note reviewed.  Constitutional:      Appearance: Normal appearance. He is not toxic-appearing or diaphoretic.     Comments: Mild distress  HENT:     Head: Normocephalic and atraumatic.     Mouth/Throat:     Mouth: Mucous membranes are moist.  Eyes:     Extraocular Movements: Extraocular movements intact.     Conjunctiva/sclera: Conjunctivae normal.     Pupils: Pupils are equal, round, and reactive to light.  Neck:     Vascular: No JVD.  Cardiovascular:     Rate and Rhythm: Normal rate and regular rhythm.     Heart sounds: Normal heart sounds. No murmur heard.    No gallop.  Pulmonary:     Effort: No respiratory distress.     Breath sounds: Normal breath sounds. No decreased breath sounds, wheezing, rhonchi or rales.     Comments: tachypneic Chest:     Chest wall: No deformity, tenderness or edema.  Abdominal:     General: Abdomen is flat.     Tenderness: There is no abdominal tenderness. There is no guarding or rebound.     Comments: protuberant  Musculoskeletal:        General: Normal range of motion.     Cervical back: Normal range of motion and neck supple.     Right lower leg: No tenderness. No edema.     Left lower leg: No tenderness. No edema.  Skin:    General: Skin is warm and dry.     Capillary Refill: Capillary refill takes less than 2 seconds.  Neurological:     Mental Status: He is alert. Mental status is at baseline.     Motor: Motor function is intact.  Psychiatric:        Mood and Affect: Mood normal.        Behavior: Behavior normal.     ED Results /  Procedures / Treatments   Labs (all labs ordered are listed, but only abnormal results are displayed) Labs Reviewed  BASIC METABOLIC PANEL - Abnormal; Notable for the following components:      Result Value   Potassium 3.3 (*)    Calcium 8.7 (*)    All other components within normal limits  CBC WITH DIFFERENTIAL/PLATELET - Abnormal; Notable for the following components:  WBC 10.9 (*)    All other components within normal limits  HEPATIC FUNCTION PANEL - Abnormal; Notable for the following components:   Albumin 3.3 (*)    All other components within normal limits  BLOOD GAS, ARTERIAL - Abnormal; Notable for the following components:   pH, Arterial 7.5 (*)    pO2, Arterial 51 (*)    Bicarbonate 29.7 (*)    Acid-Base Excess 5.9 (*)    All other components within normal limits  RESP PANEL BY RT-PCR (FLU A&B, COVID) ARPGX2  BRAIN NATRIURETIC PEPTIDE  URINALYSIS, ROUTINE W REFLEX MICROSCOPIC  TROPONIN I (HIGH SENSITIVITY)    EKG EKG Interpretation  Date/Time:  Friday June 30 2022 14:44:45 EST Ventricular Rate:  81 PR Interval:  136 QRS Duration: 90 QT Interval:  380 QTC Calculation: 442 R Axis:   88 Text Interpretation: Sinus rhythm Borderline right axis deviation Borderline T abnormalities, lateral leads Confirmed by Noemi Chapel [phone]) on 06/30/2022 3:29:39 PM  Radiology CT Angio Chest PE W and/or Wo Contrast  Result Date: 06/30/2022 CLINICAL DATA:  Concern for pulmonary embolism. EXAM: CT ANGIOGRAPHY CHEST WITH CONTRAST TECHNIQUE: Multidetector CT imaging of the chest was performed using the standard protocol during bolus administration of intravenous contrast. Multiplanar CT image reconstructions and MIPs were obtained to evaluate the vascular anatomy. RADIATION DOSE REDUCTION: This exam was performed according to the departmental dose-optimization program which includes automated exposure control, adjustment of the mA and/or kV according to patient size and/or use of  iterative reconstruction technique. CONTRAST:  59m OMNIPAQUE IOHEXOL 350 MG/ML SOLN COMPARISON:  Chest radiograph dated 06/30/2022. FINDINGS: Evaluation of this exam is limited due to respiratory motion artifact. Cardiovascular: There is no cardiomegaly or pericardial effusion. There is coronary vascular calcification of the LAD and RCA. Mild atherosclerotic calcification of the thoracic aorta. No aneurysmal dilatation or dissection. The origins of the great vessels of the aortic arch appear patent as visualized. Evaluation of the pulmonary arteries is limited due to respiratory motion. Bilateral upper, and lower lobe pulmonary artery emboli involving the segmental and subsegmental branches. The right lower lobe pulmonary artery embolus extends from the lobar branch into the segmental and subsegmental branches. No CT evidence of right heart straining. Mediastinum/Nodes: No hilar or mediastinal adenopathy. The esophagus is grossly unremarkable. No mediastinal fluid collection. Lungs/Pleura: Background of emphysema. Areas of increased interstitial coarsening predominantly involving the left upper lobe may be chronic or due to atelectasis. Developing atypical infiltrate is not excluded. No consolidative changes. There is no pleural effusion or pneumothorax. The central airways are patent. No discrete nodule identified to correspond to the nodular shadow seen on the earlier radiograph. Upper Abdomen: No acute abnormality. Musculoskeletal: No acute osseous pathology. Review of the MIP images confirms the above findings. IMPRESSION: 1. Bilateral pulmonary artery emboli. No CT evidence of right heart straining. 2. Areas of increased interstitial coarsening predominantly involving the left upper lobe may be chronic or due to atelectasis. Developing atypical infiltrate is not excluded. 3. Aortic Atherosclerosis (ICD10-I70.0) and Emphysema (ICD10-J43.9). These results were called by telephone at the time of interpretation on  06/30/2022 at 5:55 pm to Dr. BNoemi Chapel who verbally acknowledged these results. Electronically Signed   By: AAnner CreteM.D.   On: 06/30/2022 17:56   DG Chest Port 1 View  Result Date: 06/30/2022 CLINICAL DATA:  Worsening shortness of breath over several months EXAM: PORTABLE CHEST 1 VIEW COMPARISON:  09/24/2012 FINDINGS: Single frontal view of the chest demonstrates an unremarkable cardiac silhouette. There  is increased bilateral interstitial prominence, with patchy areas of airspace disease most pronounced in the perihilar regions left greater than right. 9 mm nodular area projects over the right anterior fifth intercostal space, which could reflect airspace disease or focal lung nodule. No effusion or pneumothorax. No acute bony abnormalities. IMPRESSION: 1. Increased interstitial and bilateral perihilar ground-glass airspace disease, favor atypical pneumonia over edema. 2. 9 mm nodular area of projecting over the right anterior fifth intercostal space. Differential diagnosis would include nipple shadow, nodular airspace disease, or underlying parenchymal lung nodule. Follow-up chest x-ray with nipple markers in 6-8 weeks after completion of appropriate medical management is recommended to document resolution. If this density persists at that time, and is not representative of a nipple shadow, CT chest could be performed. Electronically Signed   By: Randa Ngo M.D.   On: 06/30/2022 15:38    Procedures Cardiac monitor -- hypoxic on room air down to the upper 70s  CRITICAL CARE Performed by: Suzzette Righter and Dr. Noemi Chapel  Total critical care time: 45 minutes  Critical care time was exclusive of separately billable procedures and treating other patients.  Critical care was necessary to treat or prevent imminent or life-threatening deterioration.  Critical care was time spent personally by me on the following activities: development of treatment plan with patient and/or surrogate  as well as nursing, discussions with consultants, evaluation of patient's response to treatment, examination of patient, obtaining history from patient or surrogate, ordering and performing treatments and interventions, ordering and review of laboratory studies, ordering and review of radiographic studies, pulse oximetry and re-evaluation of patient's condition.   Medications Ordered in ED Medications  enoxaparin (LOVENOX) injection 80 mg (has no administration in time range)  nitroGLYCERIN (NITROSTAT) SL tablet 0.4 mg (0.4 mg Sublingual Given 06/30/22 1637)  aspirin tablet 325 mg (325 mg Oral Given 06/30/22 1637)  iohexol (OMNIPAQUE) 350 MG/ML injection 75 mL (75 mLs Intravenous Contrast Given 06/30/22 1733)    ED Course/ Medical Decision Making/ A&P                           Medical Decision Making Amount and/or Complexity of Data Reviewed Labs: ordered. Decision-making details documented in ED Course. Radiology: ordered. Decision-making details documented in ED Course.  Risk OTC drugs. Prescription drug management. Decision regarding hospitalization.   HEART Score for Major Cardiac Events from [email or web address]  on 06/30/2022 ** All calculations should be rechecked by clinician prior to use **  RESULT SUMMARY: 4 points Moderate Score (4-6 points)  Risk of MACE of 12-16.6%.   INPUTS: History --> 0 = Slightly suspicious EKG --> 0 = Normal Age --> 2 = ?65 Risk factors --> 2 = ?3 risk factors or history of atherosclerotic disease Initial troponin --> 0 = ?normal limit  Wells' Criteria for Pulmonary Embolism from [email or web address]  on 06/30/2022 ** All calculations should be rechecked by clinician prior to use **  RESULT SUMMARY: 3.0 points Moderate risk group: 16.2% chance of PE in an ED population.   Another study assigned scores ? 4 as "PE Unlikely" and had a 3% incidence of PE.   INPUTS: Clinical signs and symptoms of DVT --> 0 = No PE is #1 diagnosis OR equally likely --> 3 =  Yes Heart rate > 100 --> 0 = No Immobilization at least 3 days OR surgery in the previous 4 weeks --> 0 = No Previous, objectively diagnosed PE or DVT --> 0 =  No Hemoptysis --> 0 = No Malignancy w/ treatment within 6 months or palliative --> 0 = No   This is a 71 year old male who presents to the ED due to acute hypoxia for the last week where he was found to be hypoxic in his PCP office down to approximately 84% and on arrival today down to the high 70s.  He is also tachypneic though remainder of vital signs are unremarkable.  His lungs are clear to auscultation, heart sounds normal, no signs of edema, and no other physical exam findings to explain his hypoxia.  He has no history of lung disease or oxygen requirement.  He has no infectious symptoms including fever, chills, cough, congestion.  With his cardiac history and risk factors, there was concern for new onset heart failure, however, patient's BNP was unremarkable and exam is inconsistent with this.  Chest x-ray was obtained which showed questionable atypical pneumonia though again this was inconsistent with patient's exam and presentation.  Chest pain free, troponin negative, EKG unremarkable so low suspicion for cardiac event.  With his new oxygen requirement and tachypnea we proceeded with CTA of the chest to rule out pulmonary embolism and further assess possible pneumonia.  CTA of the chest showed bilateral pulmonary embolisms without signs of right heart strain.  With this, we will begin anticoagulation and admit to hospitalist for further management.  Unprovoked PE as patient presents with no recent risk factors including recent travel, recent illness, history of DVT/PE, hormone use, or lengthy sedentary.  Pharmacy consulted for anticoagulation and recommend Lovenox instead of heparin.     Final Clinical Impression(s) / ED Diagnoses Final diagnoses:  Acute pulmonary embolism without acute cor pulmonale, unspecified pulmonary embolism  type [facility])    Rx / DC Orders ED Discharge Orders     None         Suzzette Righter, PA-C 06/30/22 1829    Turner Daniels 06/30/22 1936    Noemi Chapel, MD 07/03/22 [phone]

## 2022-06-30 NOTE — ED Triage Notes (Signed)
Pt brought in by [facility] for c/o sob; pt states he has been sob for months but over the last week pt has had increased sob  Pt denies any pain

## 2022-07-01 ENCOUNTER — Inpatient Hospital Stay (HOSPITAL_COMMUNITY): Payer: 59

## 2022-07-01 DIAGNOSIS — I2699 Other pulmonary embolism without acute cor pulmonale: Secondary | ICD-10-CM | POA: Diagnosis not present

## 2022-07-01 DIAGNOSIS — I2609 Other pulmonary embolism with acute cor pulmonale: Secondary | ICD-10-CM

## 2022-07-01 DIAGNOSIS — I5022 Chronic systolic (congestive) heart failure: Secondary | ICD-10-CM | POA: Diagnosis not present

## 2022-07-01 DIAGNOSIS — I10 Essential (primary) hypertension: Secondary | ICD-10-CM

## 2022-07-01 DIAGNOSIS — J9601 Acute respiratory failure with hypoxia: Secondary | ICD-10-CM | POA: Diagnosis not present

## 2022-07-01 LAB — ECHOCARDIOGRAM COMPLETE
Area-P 1/2: 2.33 cm2
Height: 69 in
S' Lateral: 2.9 cm
Weight: 2726.65 oz

## 2022-07-01 MED ORDER — PERFLUTREN LIPID MICROSPHERE
1.0000 mL | INTRAVENOUS | Status: AC | PRN
  Administered 2022-07-01: 3 mL via INTRAVENOUS

## 2022-07-01 MED ORDER — ENOXAPARIN SODIUM 80 MG/0.8ML IJ SOSY
80.0000 mg | PREFILLED_SYRINGE | Freq: Two times a day (BID) | INTRAMUSCULAR | Status: DC
  Administered 2022-07-01 – 2022-07-03 (×4): 80 mg via SUBCUTANEOUS
  Filled 2022-07-01 (×4): qty 0.8

## 2022-07-01 NOTE — Progress Notes (Signed)
  Transition of Care [facility]) Screening Note   Patient Details  Name: Ruben Young Date of Birth: 03-17-1951   Transition of Care [facility]) CM/SW Contact:    Iona Beard, [place] Phone Number: 07/01/2022, 12:44 PM    Transition of Care Department [facility]) has reviewed patient and no TOC needs have been identified at this time. We will continue to monitor patient advancement through interdisciplinary progression rounds. If new patient transition needs arise, please place a TOC consult.

## 2022-07-01 NOTE — Plan of Care (Signed)

## 2022-07-01 NOTE — Progress Notes (Addendum)
PROGRESS NOTE  LUIAN SCHUMPERT SWF:[id number] DOB: 03-15-1951 DOA: 06/30/2022 PCP: Iona Beard, MD  Brief History:  71 year old male with a history of hypertension, hyperlipidemia, coronary disease, AAA, COPD presenting with 2-week history of worsening dyspnea on exertion.  He states that he has had shortness of breath and dyspnea on exertion for the better part of the last 6 months but it has worsened in the last 2 weeks.  He describes some substernal chest discomfort associated with this that improves with rest.  He has not had any fevers, chills.  He complains of a chronic cough that has not worsened.  It is largely nonproductive.  He denies any hemoptysis.  The patient has chronic orthopnea and has to sleep on 3 pillows.  He denies any lower extremity edema or PND.  He states that he quit smoking about 3 months prior to this admission after nearly 30-pack-year history of tobacco.  He denies any nausea, vomiting, direct abdominal pain, dysuria, hematuria, hematochezia, melena.  He went to see his PCP 2 weeks prior to this admission.  Ambulatory pulse ox was checked on RA and the patient desaturated into the 80s.  The patient was given a sample of Breztri inhaler.  He states that he was not given any other medications.  He has not been on any other new medications.  He denies any recent travels or surgeries.  He denies any family history of blood clots or personal history of blood clots. In the ED, the patient was afebrile and hemodynamically stable.  Oxygen saturation was 91% on room air.  WBC 10.9, hemoglobin 14.5, platelets 216,000.  Sodium 139, potassium 3.3, bicarbonate 24, serum creatinine 1.01.  LFTs were unremarkable.  CTA chest was positive for bilateral PE without any evidence of right heart strain.  There was interstitial coarsening of the left upper lobe.  There was emphysema and ASVD noted.  The patient was started on subcutaneous Lovenox and admitted for further evaluation and  treatment of his dyspnea and PE.  Assessment/Plan: Acute respiratory failure with hypoxia -Secondary to pulmonary embolus in the setting of COPD -Stable on 3 L nasal cannula -Wean oxygen as tolerated for saturation greater 92%  Acute pulmonary embolus -Remains hemodynamically stable -Continue subcutaneous Lovenox -Echocardiogram -Check factor V Leiden -Prothrombin gene mutation -Lupus anticoagulant  COPD -Start bronchodilators  Chronic systolic CHF -clinically compensated -09/25/12 Echo EF 40-45%, G1DD +WMA -continue coreg  Essential hypertension -Continue amlodipine and carvedilol  Hyperlipidemia -Restart statin  AAA -06/01/22 [place] = 2.5cm -continue outpt surveillance      Family Communication:  no Family at bedside  Consultants:  none  Code Status:  FULL   DVT Prophylaxis:  [place] Lovenox   Procedures: As Listed in Progress Note Above  Antibiotics: None        Subjective: He continues to have dyspnea on exertion.  He has a nonproductive cough.  Denies any fevers, chills, chest pain, nausea, vomiting or diarrhea or abdominal pain.  There is no dysuria or hematuria.  Objective: Vitals:   06/30/22 2043 07/01/22 0002 07/01/22 0407 07/01/22 0954  BP:  114/62 139/82 (!) 141/85  Pulse:  65 78   Resp:  20 20   Temp:  98.3 F (36.8 C) 98.1 F (36.7 C)   TempSrc:      SpO2:  95% 91%   Weight: 77.3 kg     Height: '5\' 9"'$  (1.753 m)      No intake or  output data in the 24 hours ending 07/01/22 0958 Weight change:  Exam:  General:  Pt is alert, follows commands appropriately, not in acute distress HEENT: No icterus, No thrush, No neck mass, [place]/AT Cardiovascular: RRR, S1/S2, no rubs, no gallops Respiratory: Bibasilar rales.  No wheezing.  Good air movement Abdomen: Soft/+BS, non tender, non distended, no guarding Extremities: No edema, No lymphangitis, No petechiae, No rashes, no synovitis   Data Reviewed: I have personally reviewed following labs and  imaging studies Basic Metabolic Panel: Recent Labs  Lab 06/30/22 1439  NA 139  K 3.3*  CL 106  CO2 24  GLUCOSE 94  BUN 9  CREATININE 1.01  CALCIUM 8.7*   Liver Function Tests: Recent Labs  Lab 06/30/22 1439  AST 19  ALT 15  ALKPHOS 86  BILITOT 0.4  PROT 7.4  ALBUMIN 3.3*   No results for input(s): "LIPASE", "AMYLASE" in the last 168 hours. No results for input(s): "AMMONIA" in the last 168 hours. Coagulation Profile: Recent Labs  Lab 06/30/22 1439  INR 1.1   CBC: Recent Labs  Lab 06/30/22 1439  WBC 10.9*  NEUTROABS 7.0  HGB 14.5  HCT 43.4  MCV 87.7  PLT 216   Cardiac Enzymes: No results for input(s): "CKTOTAL", "CKMB", "CKMBINDEX", "TROPONINI" in the last 168 hours. BNP: Invalid input(s): "POCBNP" CBG: No results for input(s): "GLUCAP" in the last 168 hours. HbA1C: No results for input(s): "HGBA1C" in the last 72 hours. Urine analysis:    Component Value Date/Time   COLORURINE YELLOW 06/30/2022 1630   APPEARANCEUR CLEAR 06/30/2022 1630   LABSPEC 1.013 06/30/2022 1630   PHURINE 6.0 06/30/2022 1630   GLUCOSEU NEGATIVE 06/30/2022 1630   HGBUR NEGATIVE 06/30/2022 1630   BILIRUBINUR NEGATIVE 06/30/2022 1630   KETONESUR NEGATIVE 06/30/2022 1630   PROTEINUR 30 (A) 06/30/2022 1630   NITRITE NEGATIVE 06/30/2022 1630   LEUKOCYTESUR TRACE (A) 06/30/2022 1630   Sepsis Labs: '@LABRCNTIP'$ (procalcitonin:4,lacticidven:4) ) Recent Results (from the past 240 hour(s))  Resp Panel by RT-PCR (Flu A&B, Covid) Anterior Nasal Swab     Status: None   Collection Time: 06/30/22  3:26 PM   Specimen: Anterior Nasal Swab  Result Value Ref Range Status   SARS Coronavirus 2 by RT PCR NEGATIVE NEGATIVE Final    Comment: (NOTE) SARS-CoV-2 target nucleic acids are NOT DETECTED.  The SARS-CoV-2 RNA is generally detectable in upper respiratory specimens during the acute phase of infection. The lowest concentration of SARS-CoV-2 viral copies this assay can detect is 138  copies/mL. A negative result does not preclude SARS-Cov-2 infection and should not be used as the sole basis for treatment or other patient management decisions. A negative result may occur with  improper specimen collection/handling, submission of specimen other than nasopharyngeal swab, presence of viral mutation(s) within the areas targeted by this assay, and inadequate number of viral copies(<138 copies/mL). A negative result must be combined with clinical observations, patient history, and epidemiological information. The expected result is Negative.  Fact Sheet for Patients:  EntrepreneurPulse.com.au  Fact Sheet for Healthcare Providers:  IncredibleEmployment.be  This test is no t yet approved or cleared by the [place] FDA and  has been authorized for detection and/or diagnosis of SARS-CoV-2 by FDA under an Emergency Use Authorization (EUA). This EUA will remain  in effect (meaning this test can be used) for the duration of the COVID-19 declaration under Section 564(b)(1) of the Act, 21 U.S.C.section 360bbb-3(b)(1), unless the authorization is terminated  or revoked sooner.  Influenza A by PCR NEGATIVE NEGATIVE Final   Influenza B by PCR NEGATIVE NEGATIVE Final    Comment: (NOTE) The Xpert Xpress SARS-CoV-2/FLU/RSV plus assay is intended as an aid in the diagnosis of influenza from Nasopharyngeal swab specimens and should not be used as a sole basis for treatment. Nasal washings and aspirates are unacceptable for Xpert Xpress SARS-CoV-2/FLU/RSV testing.  Fact Sheet for Patients: EntrepreneurPulse.com.au  Fact Sheet for Healthcare Providers: IncredibleEmployment.be  This test is not yet approved or cleared by the [place] FDA and has been authorized for detection and/or diagnosis of SARS-CoV-2 by FDA under an Emergency Use Authorization (EUA). This EUA will remain in effect (meaning  this test can be used) for the duration of the COVID-19 declaration under Section 564(b)(1) of the Act, 21 U.S.C. section 360bbb-3(b)(1), unless the authorization is terminated or revoked.  Performed at [facility], [address] [place], [place] [address]      Scheduled Meds:  amLODipine  10 mg Oral Daily   aspirin EC  81 mg Oral Daily   carvedilol  6.25 mg Oral BID WC   enoxaparin (LOVENOX) injection  1 mg/kg Subcutaneous BID   Continuous Infusions:  Procedures/Studies: US Venous Img Lower Bilateral (DVT)  Result Date: 07/01/2022 CLINICAL DATA:  71 year old male with history of acute pulmonary embolism. EXAM: BILATERAL LOWER EXTREMITY VENOUS DOPPLER ULTRASOUND TECHNIQUE: Gray-scale sonography with graded compression, as well as color Doppler and duplex ultrasound were performed to evaluate the lower extremity deep venous systems from the level of the common femoral vein and including the common femoral, femoral, profunda femoral, popliteal and calf veins including the posterior tibial, peroneal and gastrocnemius veins when visible. The superficial great saphenous vein was also interrogated. Spectral Doppler was utilized to evaluate flow at rest and with distal augmentation maneuvers in the common femoral, femoral and popliteal veins. COMPARISON:  CT a chest from 06/30/2022 FINDINGS: RIGHT LOWER EXTREMITY Common Femoral Vein: No evidence of thrombus. Normal compressibility, respiratory phasicity and response to augmentation. Saphenofemoral Junction: No evidence of thrombus. Normal compressibility and flow on color Doppler imaging. Profunda Femoral Vein: No evidence of thrombus. Normal compressibility and flow on color Doppler imaging. Femoral Vein: No evidence of thrombus. Normal compressibility, respiratory phasicity and response to augmentation. Popliteal Vein: No evidence of thrombus. Normal compressibility, respiratory phasicity and response to augmentation. Calf Veins: No evidence of  thrombus. Normal compressibility and flow on color Doppler imaging. Other Findings:  None. LEFT LOWER EXTREMITY Common Femoral Vein: No evidence of thrombus. Normal compressibility, respiratory phasicity and response to augmentation. Saphenofemoral Junction: No evidence of thrombus. Normal compressibility and flow on color Doppler imaging. Profunda Femoral Vein: No evidence of thrombus. Normal compressibility and flow on color Doppler imaging. Femoral Vein: No evidence of thrombus. Normal compressibility, respiratory phasicity and response to augmentation. Popliteal Vein: Heterogeneously hypoechoic, expansile, noncompressible, occlusive thrombus is present. Calf Veins: No evidence of thrombus. Normal compressibility and flow on color Doppler imaging. Other Findings:  None. IMPRESSION: Acute, occlusive deep vein thrombosis limited to the left popliteal vein. Ruthann Cancer, MD [facility] Electronically Signed   By: Ruthann Cancer M.D.   On: 07/01/2022 09:40   CT Angio Chest PE W and/or Wo Contrast  Result Date: 06/30/2022 CLINICAL DATA:  Concern for pulmonary embolism. EXAM: CT ANGIOGRAPHY CHEST WITH CONTRAST TECHNIQUE: Multidetector CT imaging of the chest was performed using the standard protocol during bolus administration of intravenous contrast. Multiplanar CT image reconstructions and MIPs were obtained to evaluate  the vascular anatomy. RADIATION DOSE REDUCTION: This exam was performed according to the departmental dose-optimization program which includes automated exposure control, adjustment of the mA and/or kV according to patient size and/or use of iterative reconstruction technique. CONTRAST:  53m OMNIPAQUE IOHEXOL 350 MG/ML SOLN COMPARISON:  Chest radiograph dated 06/30/2022. FINDINGS: Evaluation of this exam is limited due to respiratory motion artifact. Cardiovascular: There is no cardiomegaly or pericardial effusion. There is coronary  vascular calcification of the LAD and RCA. Mild atherosclerotic calcification of the thoracic aorta. No aneurysmal dilatation or dissection. The origins of the great vessels of the aortic arch appear patent as visualized. Evaluation of the pulmonary arteries is limited due to respiratory motion. Bilateral upper, and lower lobe pulmonary artery emboli involving the segmental and subsegmental branches. The right lower lobe pulmonary artery embolus extends from the lobar branch into the segmental and subsegmental branches. No CT evidence of right heart straining. Mediastinum/Nodes: No hilar or mediastinal adenopathy. The esophagus is grossly unremarkable. No mediastinal fluid collection. Lungs/Pleura: Background of emphysema. Areas of increased interstitial coarsening predominantly involving the left upper lobe may be chronic or due to atelectasis. Developing atypical infiltrate is not excluded. No consolidative changes. There is no pleural effusion or pneumothorax. The central airways are patent. No discrete nodule identified to correspond to the nodular shadow seen on the earlier radiograph. Upper Abdomen: No acute abnormality. Musculoskeletal: No acute osseous pathology. Review of the MIP images confirms the above findings. IMPRESSION: 1. Bilateral pulmonary artery emboli. No CT evidence of right heart straining. 2. Areas of increased interstitial coarsening predominantly involving the left upper lobe may be chronic or due to atelectasis. Developing atypical infiltrate is not excluded. 3. Aortic Atherosclerosis (ICD10-I70.0) and Emphysema (ICD10-J43.9). These results were called by telephone at the time of interpretation on 06/30/2022 at 5:55 pm to Dr. BNoemi Chapel who verbally acknowledged these results. Electronically Signed   By: AAnner CreteM.D.   On: 06/30/2022 17:56   DG Chest Port 1 View  Result Date: 06/30/2022 CLINICAL DATA:  Worsening shortness of breath over several months EXAM: PORTABLE CHEST 1  VIEW COMPARISON:  09/24/2012 FINDINGS: Single frontal view of the chest demonstrates an unremarkable cardiac silhouette. There is increased bilateral interstitial prominence, with patchy areas of airspace disease most pronounced in the perihilar regions left greater than right. 9 mm nodular area projects over the right anterior fifth intercostal space, which could reflect airspace disease or focal lung nodule. No effusion or pneumothorax. No acute bony abnormalities. IMPRESSION: 1. Increased interstitial and bilateral perihilar ground-glass airspace disease, favor atypical pneumonia over edema. 2. 9 mm nodular area of projecting over the right anterior fifth intercostal space. Differential diagnosis would include nipple shadow, nodular airspace disease, or underlying parenchymal lung nodule. Follow-up chest x-ray with nipple markers in 6-8 weeks after completion of appropriate medical management is recommended to document resolution. If this density persists at that time, and is not representative of a nipple shadow, CT chest could be performed. Electronically Signed   By: MRanda NgoM.D.   On: 06/30/2022 15:38    DOrson Eva DO  [facility]  If 7PM-7AM, please contact night-coverage [email or web address] Password [email or web address] 07/01/2022, 9:58 AM   LOS: 1 day

## 2022-07-01 NOTE — Progress Notes (Signed)
Patient very pleasant. Ambulated in room. Sat in chair for while, not back in bed attempting a short nap. Call bell within reach.

## 2022-07-02 DIAGNOSIS — J9601 Acute respiratory failure with hypoxia: Secondary | ICD-10-CM | POA: Diagnosis not present

## 2022-07-02 DIAGNOSIS — I5022 Chronic systolic (congestive) heart failure: Secondary | ICD-10-CM | POA: Diagnosis not present

## 2022-07-02 DIAGNOSIS — I2699 Other pulmonary embolism without acute cor pulmonale: Secondary | ICD-10-CM | POA: Diagnosis not present

## 2022-07-02 LAB — BASIC METABOLIC PANEL
Anion gap: 7 (ref 5–15)
BUN: 14 mg/dL (ref 8–23)
CO2: 26 mmol/L (ref 22–32)
Calcium: 9.1 mg/dL (ref 8.9–10.3)
Chloride: 108 mmol/L (ref 98–111)
Creatinine, Ser: 0.98 mg/dL (ref 0.61–1.24)
GFR, Estimated: >60 mL/min (ref >60)
Glucose, Bld: 107 mg/dL — ABNORMAL HIGH (ref 70–99)
Potassium: 3.7 mmol/L (ref 3.5–5.1)
Sodium: 141 mmol/L (ref 135–145)

## 2022-07-02 LAB — CBC
HCT: 42.7 % (ref 39.0–52.0)
Hemoglobin: 13.6 g/dL (ref 13.0–17.0)
MCH: 28.6 pg (ref 26.0–34.0)
MCHC: 31.9 g/dL (ref 30.0–36.0)
MCV: 89.9 fL (ref 80.0–100.0)
Platelets: 210 10*3/uL (ref 150–400)
RBC: 4.75 MIL/uL (ref 4.22–5.81)
RDW: 14.6 % (ref 11.5–15.5)
WBC: 8.1 10*3/uL (ref 4.0–10.5)
nRBC: 0 % (ref 0.0–0.2)

## 2022-07-02 LAB — MAGNESIUM: Magnesium: 2.2 mg/dL (ref 1.7–2.4)

## 2022-07-02 NOTE — Progress Notes (Signed)
PROGRESS NOTE  Ruben Young TMA:[id number] DOB: 09-09-1950 DOA: 06/30/2022 PCP: Iona Beard, MD Brief History:  71 year old male with a history of hypertension, hyperlipidemia, coronary disease, AAA, COPD presenting with 2-week history of worsening dyspnea on exertion.  He states that he has had shortness of breath and dyspnea on exertion for the better part of the last 6 months but it has worsened in the last 2 weeks.  He describes some substernal chest discomfort associated with this that improves with rest.  He has not had any fevers, chills.  He complains of a chronic cough that has not worsened.  It is largely nonproductive.  He denies any hemoptysis.  The patient has chronic orthopnea and has to sleep on 3 pillows.  He denies any lower extremity edema or PND.  He states that he quit smoking about 3 months prior to this admission after nearly 30-pack-year history of tobacco.  He denies any nausea, vomiting, direct abdominal pain, dysuria, hematuria, hematochezia, melena.  He went to see his PCP 2 weeks prior to this admission.  Ambulatory pulse ox was checked on RA and the patient desaturated into the 80s.  The patient was given a sample of Breztri inhaler.  He states that he was not given any other medications.  He has not been on any other new medications.  He denies any recent travels or surgeries.  He denies any family history of blood clots or personal history of blood clots. In the ED, the patient was afebrile and hemodynamically stable.  Oxygen saturation was 91% on room air.  WBC 10.9, hemoglobin 14.5, platelets 216,000.  Sodium 139, potassium 3.3, bicarbonate 24, serum creatinine 1.01.  LFTs were unremarkable.  CTA chest was positive for bilateral PE without any evidence of right heart strain.  There was interstitial coarsening of the left upper lobe.  There was emphysema and ASVD noted.  The patient was started on subcutaneous Lovenox and admitted for further evaluation and  treatment of his dyspnea and PE.   Assessment/Plan: Acute respiratory failure with hypoxia -Secondary to pulmonary embolus in the setting of COPD -Stable on 3 L nasal cannula>>2L -Wean oxygen as tolerated for saturation greater 92%   Acute pulmonary embolus -Remains hemodynamically stable -Continue subcutaneous Lovenox>>transition to apixaban prior to dc -12/2 Echo 50-55%, no WMA, normal RVF -Check factor V Leiden -Prothrombin gene mutation -Lupus anticoagulant  Left popliteal DVT -AC as above   COPD -Start bronchodilators   Chronic systolic CHF -clinically compensated -09/25/12 Echo EF 40-45%, G1DD +WMA -07/01/22 Echo 50-55%, no WMA, normal RVF -continue coreg   Essential hypertension -Continue amlodipine and carvedilol -he's been on amlodipine for at least 10 years   Hyperlipidemia -Restart statin   AAA -06/01/22 [place] = 2.5cm -continue outpt surveillance           Family Communication:  no Family at bedside   Consultants:  none   Code Status:  FULL    DVT Prophylaxis:  [employer] Lovenox     Procedures: As Listed in Progress Note Above   Antibiotics: None      Subjective: Patient denies fevers, chills, headache, chest pain, nausea, vomiting, diarrhea, abdominal pain, dysuria, hematuria, hematochezia, and melena. He still has dyspnea on exertion, but improving   Objective: Vitals:   07/01/22 0954 07/01/22 1424 07/01/22 2047 07/02/22 0425  BP: (!) 141/85 128/74 133/85 122/77  Pulse:  74 67 67  Resp:  '17 18 18  '$ Temp:  98.5  F (36.9 C) 98.2 F (36.8 C) 98.2 F (36.8 C)  TempSrc:   Oral Oral  SpO2:  95%  97%  Weight:      Height:        Intake/Output Summary (Last 24 hours) at 07/02/2022 1558 Last data filed at 07/02/2022 1300 Gross per 24 hour  Intake 720 ml  Output --  Net 720 ml   Weight change:  Exam:  General:  Pt is alert, follows commands appropriately, not in acute distress HEENT: No icterus, No thrush, No neck mass,  [place]/AT Cardiovascular: RRR, S1/S2, no rubs, no gallops Respiratory: bibasilar rales. No wheeze Abdomen: Soft/+BS, non tender, non distended, no guarding Extremities: No edema, No lymphangitis, No petechiae, No rashes, no synovitis   Data Reviewed: I have personally reviewed following labs and imaging studies Basic Metabolic Panel: Recent Labs  Lab 06/30/22 1439 07/02/22 0412  NA 139 141  K 3.3* 3.7  CL 106 108  CO2 24 26  GLUCOSE 94 107*  BUN 9 14  CREATININE 1.01 0.98  CALCIUM 8.7* 9.1  MG  --  2.2   Liver Function Tests: Recent Labs  Lab 06/30/22 1439  AST 19  ALT 15  ALKPHOS 86  BILITOT 0.4  PROT 7.4  ALBUMIN 3.3*   No results for input(s): "LIPASE", "AMYLASE" in the last 168 hours. No results for input(s): "AMMONIA" in the last 168 hours. Coagulation Profile: Recent Labs  Lab 06/30/22 1439  INR 1.1   CBC: Recent Labs  Lab 06/30/22 1439 07/02/22 0412  WBC 10.9* 8.1  NEUTROABS 7.0  --   HGB 14.5 13.6  HCT 43.4 42.7  MCV 87.7 89.9  PLT 216 210   Cardiac Enzymes: No results for input(s): "CKTOTAL", "CKMB", "CKMBINDEX", "TROPONINI" in the last 168 hours. BNP: Invalid input(s): "POCBNP" CBG: No results for input(s): "GLUCAP" in the last 168 hours. HbA1C: No results for input(s): "HGBA1C" in the last 72 hours. Urine analysis:    Component Value Date/Time   COLORURINE YELLOW 06/30/2022 1630   APPEARANCEUR CLEAR 06/30/2022 1630   LABSPEC 1.013 06/30/2022 1630   PHURINE 6.0 06/30/2022 1630   GLUCOSEU NEGATIVE 06/30/2022 1630   HGBUR NEGATIVE 06/30/2022 1630   BILIRUBINUR NEGATIVE 06/30/2022 1630   KETONESUR NEGATIVE 06/30/2022 1630   PROTEINUR 30 (A) 06/30/2022 1630   NITRITE NEGATIVE 06/30/2022 1630   LEUKOCYTESUR TRACE (A) 06/30/2022 1630   Sepsis Labs: '@LABRCNTIP'$ (procalcitonin:4,lacticidven:4) ) Recent Results (from the past 240 hour(s))  Resp Panel by RT-PCR (Flu A&B, Covid) Anterior Nasal Swab     Status: None   Collection Time:  06/30/22  3:26 PM   Specimen: Anterior Nasal Swab  Result Value Ref Range Status   SARS Coronavirus 2 by RT PCR NEGATIVE NEGATIVE Final    Comment: (NOTE) SARS-CoV-2 target nucleic acids are NOT DETECTED.  The SARS-CoV-2 RNA is generally detectable in upper respiratory specimens during the acute phase of infection. The lowest concentration of SARS-CoV-2 viral copies this assay can detect is 138 copies/mL. A negative result does not preclude SARS-Cov-2 infection and should not be used as the sole basis for treatment or other patient management decisions. A negative result may occur with  improper specimen collection/handling, submission of specimen other than nasopharyngeal swab, presence of viral mutation(s) within the areas targeted by this assay, and inadequate number of viral copies(<138 copies/mL). A negative result must be combined with clinical observations, patient history, and epidemiological information. The expected result is Negative.  Fact Sheet for Patients:  EntrepreneurPulse.com.au  Fact  Sheet for Healthcare Providers:  IncredibleEmployment.be  This test is no t yet approved or cleared by the [place] FDA and  has been authorized for detection and/or diagnosis of SARS-CoV-2 by FDA under an Emergency Use Authorization (EUA). This EUA will remain  in effect (meaning this test can be used) for the duration of the COVID-19 declaration under Section 564(b)(1) of the Act, 21 U.S.C.section 360bbb-3(b)(1), unless the authorization is terminated  or revoked sooner.       Influenza A by PCR NEGATIVE NEGATIVE Final   Influenza B by PCR NEGATIVE NEGATIVE Final    Comment: (NOTE) The Xpert Xpress SARS-CoV-2/FLU/RSV plus assay is intended as an aid in the diagnosis of influenza from Nasopharyngeal swab specimens and should not be used as a sole basis for treatment. Nasal washings and aspirates are unacceptable for Xpert Xpress  SARS-CoV-2/FLU/RSV testing.  Fact Sheet for Patients: EntrepreneurPulse.com.au  Fact Sheet for Healthcare Providers: IncredibleEmployment.be  This test is not yet approved or cleared by the [place] FDA and has been authorized for detection and/or diagnosis of SARS-CoV-2 by FDA under an Emergency Use Authorization (EUA). This EUA will remain in effect (meaning this test can be used) for the duration of the COVID-19 declaration under Section 564(b)(1) of the Act, 21 U.S.C. section 360bbb-3(b)(1), unless the authorization is terminated or revoked.  Performed at [facility], [address], [place], [place] [address]      Scheduled Meds:  amLODipine  10 mg Oral Daily   aspirin EC  81 mg Oral Daily   carvedilol  6.25 mg Oral BID WC   enoxaparin (LOVENOX) injection  80 mg Subcutaneous BID   Continuous Infusions:  Procedures/Studies: ECHOCARDIOGRAM COMPLETE  Result Date: 07/01/2022    ECHOCARDIOGRAM REPORT   Patient Name:   HERSCHELL VIRANI Date of Exam: 07/01/2022 Medical Rec #:  [id number]         Height:       69.0 in Accession #:    [id number]        Weight:       170.4 lb Date of Birth:  03-09-1951        BSA:          1.930 m Patient Age:    64 years          BP:           141/85 mmHg Patient Gender: M                 HR:           77 bpm. Exam Location:  Inpatient Procedure: 2D Echo, Color Doppler, Cardiac Doppler and Intracardiac            Opacification Agent Indications:    Pulmonary Embolus I26.09  History:        Patient has prior history of Echocardiogram examinations, most                 recent 09/25/2012. COPD; Risk Factors:Hypertension, Dyslipidemia                 and Current Smoker. Abdominal aortic aneurysm.  Sonographer:    Darlina Sicilian RDCS Referring Phys: [phone] JACOB J STINSON  Sonographer Comments: Technically difficult study due to poor echo windows, suboptimal apical window and suboptimal subcostal window. IMPRESSIONS  1.  Poor acoustic windows limit study.  2. Left ventricular ejection fraction, by estimation, is 50 to 55%. The left ventricle has normal function. The left ventricle has no  regional wall motion abnormalities. Left ventricular diastolic parameters are indeterminate.  3. Right ventricular systolic function is normal. The right ventricular size is normal. There is normal pulmonary artery systolic pressure.  4. The mitral valve is grossly normal. No evidence of mitral valve regurgitation. No evidence of mitral stenosis.  5. The aortic valve was not well visualized. Aortic valve regurgitation is not visualized. No aortic stenosis is present.  6. The inferior vena cava is normal in size with greater than 50% respiratory variability, suggesting right atrial pressure of 3 mmHg. FINDINGS  Left Ventricle: Left ventricular ejection fraction, by estimation, is 50 to 55%. The left ventricle has normal function. The left ventricle has no regional wall motion abnormalities. Definity contrast agent was given IV to delineate the left ventricular  endocardial borders. The left ventricular internal cavity size was normal in size. There is no left ventricular hypertrophy. Left ventricular diastolic parameters are indeterminate. Right Ventricle: The right ventricular size is normal. Right vetricular wall thickness was not well visualized. Right ventricular systolic function is normal. There is normal pulmonary artery systolic pressure. The tricuspid regurgitant velocity is 2.18 m/s, and with an assumed right atrial pressure of 15 mmHg, the estimated right ventricular systolic pressure is 38.7 mmHg. Left Atrium: Left atrial size was normal in size. Right Atrium: Right atrial size was normal in size. Pericardium: There is no evidence of pericardial effusion. Mitral Valve: The mitral valve is grossly normal. No evidence of mitral valve regurgitation. No evidence of mitral valve stenosis. Tricuspid Valve: The tricuspid valve is grossly normal.  Tricuspid valve regurgitation is trivial. No evidence of tricuspid stenosis. Aortic Valve: The aortic valve was not well visualized. Aortic valve regurgitation is not visualized. No aortic stenosis is present. Pulmonic Valve: The pulmonic valve was grossly normal. Pulmonic valve regurgitation is not visualized. No evidence of pulmonic stenosis. Aorta: The aortic root is normal in size and structure. Venous: The inferior vena cava is normal in size with greater than 50% respiratory variability, suggesting right atrial pressure of 3 mmHg. IAS/Shunts: No atrial level shunt detected by color flow Doppler.  LEFT VENTRICLE PLAX 2D LVIDd:         4.00 cm   Diastology LVIDs:         2.90 cm   LV e' medial:    5.76 cm/s LV PW:         0.90 cm   LV E/e' medial:  9.1 LV IVS:        0.80 cm   LV e' lateral:   7.88 cm/s LVOT diam:     2.00 cm   LV E/e' lateral: 6.7 LVOT Area:     3.14 cm  LEFT ATRIUM             Index LA diam:        2.30 cm 1.19 cm/m LA Vol (A2C):   15.0 ml 7.77 ml/m LA Vol (A4C):   18.4 ml 9.53 ml/m LA Biplane Vol: 17.9 ml 9.27 ml/m   AORTA Ao Root diam: 2.90 cm MITRAL VALVE               TRICUSPID VALVE MV Area (PHT): 2.33 cm    TR Peak grad:   19.0 mmHg MV Decel Time: 325 msec    TR Vmax:        218.00 cm/s MV E velocity: 52.70 cm/s MV A velocity: 87.40 cm/s  SHUNTS MV E/A ratio:  0.60        Systemic Diam:  2.00 cm Dorris Carnes MD Electronically signed by Dorris Carnes MD Signature Date/Time: 07/01/2022/2:10:24 PM    Final    US Venous Img Lower Bilateral (DVT)  Result Date: 07/01/2022 CLINICAL DATA:  71 year old male with history of acute pulmonary embolism. EXAM: BILATERAL LOWER EXTREMITY VENOUS DOPPLER ULTRASOUND TECHNIQUE: Gray-scale sonography with graded compression, as well as color Doppler and duplex ultrasound were performed to evaluate the lower extremity deep venous systems from the level of the common femoral vein and including the common femoral, femoral, profunda femoral, popliteal and calf  veins including the posterior tibial, peroneal and gastrocnemius veins when visible. The superficial great saphenous vein was also interrogated. Spectral Doppler was utilized to evaluate flow at rest and with distal augmentation maneuvers in the common femoral, femoral and popliteal veins. COMPARISON:  CT a chest from 06/30/2022 FINDINGS: RIGHT LOWER EXTREMITY Common Femoral Vein: No evidence of thrombus. Normal compressibility, respiratory phasicity and response to augmentation. Saphenofemoral Junction: No evidence of thrombus. Normal compressibility and flow on color Doppler imaging. Profunda Femoral Vein: No evidence of thrombus. Normal compressibility and flow on color Doppler imaging. Femoral Vein: No evidence of thrombus. Normal compressibility, respiratory phasicity and response to augmentation. Popliteal Vein: No evidence of thrombus. Normal compressibility, respiratory phasicity and response to augmentation. Calf Veins: No evidence of thrombus. Normal compressibility and flow on color Doppler imaging. Other Findings:  None. LEFT LOWER EXTREMITY Common Femoral Vein: No evidence of thrombus. Normal compressibility, respiratory phasicity and response to augmentation. Saphenofemoral Junction: No evidence of thrombus. Normal compressibility and flow on color Doppler imaging. Profunda Femoral Vein: No evidence of thrombus. Normal compressibility and flow on color Doppler imaging. Femoral Vein: No evidence of thrombus. Normal compressibility, respiratory phasicity and response to augmentation. Popliteal Vein: Heterogeneously hypoechoic, expansile, noncompressible, occlusive thrombus is present. Calf Veins: No evidence of thrombus. Normal compressibility and flow on color Doppler imaging. Other Findings:  None. IMPRESSION: Acute, occlusive deep vein thrombosis limited to the left popliteal vein. Ruthann Cancer, MD [facility] Electronically Signed   By: Ruthann Cancer M.D.   On: 07/01/2022 09:40   CT Angio Chest PE W and/or Wo Contrast  Result Date: 06/30/2022 CLINICAL DATA:  Concern for pulmonary embolism. EXAM: CT ANGIOGRAPHY CHEST WITH CONTRAST TECHNIQUE: Multidetector CT imaging of the chest was performed using the standard protocol during bolus administration of intravenous contrast. Multiplanar CT image reconstructions and MIPs were obtained to evaluate the vascular anatomy. RADIATION DOSE REDUCTION: This exam was performed according to the departmental dose-optimization program which includes automated exposure control, adjustment of the mA and/or kV according to patient size and/or use of iterative reconstruction technique. CONTRAST:  1m OMNIPAQUE IOHEXOL 350 MG/ML SOLN COMPARISON:  Chest radiograph dated 06/30/2022. FINDINGS: Evaluation of this exam is limited due to respiratory motion artifact. Cardiovascular: There is no cardiomegaly or pericardial effusion. There is coronary vascular calcification of the LAD and RCA. Mild atherosclerotic calcification of the thoracic aorta. No aneurysmal dilatation or dissection. The origins of the great vessels of the aortic arch appear patent as visualized. Evaluation of the pulmonary arteries is limited due to respiratory motion. Bilateral upper, and lower lobe pulmonary artery emboli involving the segmental and subsegmental branches. The right lower lobe pulmonary artery embolus extends from the lobar branch into the segmental and subsegmental branches. No CT evidence of right heart straining. Mediastinum/Nodes: No hilar or mediastinal adenopathy. The esophagus is grossly unremarkable. No mediastinal fluid collection. Lungs/Pleura: Background of emphysema. Areas of increased interstitial coarsening predominantly  involving the left upper lobe may be chronic or due to atelectasis. Developing atypical infiltrate is not excluded. No consolidative changes. There is no pleural effusion or pneumothorax. The central airways  are patent. No discrete nodule identified to correspond to the nodular shadow seen on the earlier radiograph. Upper Abdomen: No acute abnormality. Musculoskeletal: No acute osseous pathology. Review of the MIP images confirms the above findings. IMPRESSION: 1. Bilateral pulmonary artery emboli. No CT evidence of right heart straining. 2. Areas of increased interstitial coarsening predominantly involving the left upper lobe may be chronic or due to atelectasis. Developing atypical infiltrate is not excluded. 3. Aortic Atherosclerosis (ICD10-I70.0) and Emphysema (ICD10-J43.9). These results were called by telephone at the time of interpretation on 06/30/2022 at 5:55 pm to Dr. Noemi Chapel, who verbally acknowledged these results. Electronically Signed   By: Anner Crete M.D.   On: 06/30/2022 17:56   DG Chest Port 1 View  Result Date: 06/30/2022 CLINICAL DATA:  Worsening shortness of breath over several months EXAM: PORTABLE CHEST 1 VIEW COMPARISON:  09/24/2012 FINDINGS: Single frontal view of the chest demonstrates an unremarkable cardiac silhouette. There is increased bilateral interstitial prominence, with patchy areas of airspace disease most pronounced in the perihilar regions left greater than right. 9 mm nodular area projects over the right anterior fifth intercostal space, which could reflect airspace disease or focal lung nodule. No effusion or pneumothorax. No acute bony abnormalities. IMPRESSION: 1. Increased interstitial and bilateral perihilar ground-glass airspace disease, favor atypical pneumonia over edema. 2. 9 mm nodular area of projecting over the right anterior fifth intercostal space. Differential diagnosis would include nipple shadow, nodular airspace disease, or underlying parenchymal lung nodule. Follow-up chest x-ray with nipple markers in 6-8 weeks after completion of appropriate medical management is recommended to document resolution. If this density persists at that time, and is not  representative of a nipple shadow, CT chest could be performed. Electronically Signed   By: Randa Ngo M.D.   On: 06/30/2022 15:38    Orson Eva, DO  [facility]  If 7PM-7AM, please contact night-coverage [email or web address] Password [email or web address] 07/02/2022, 3:58 PM   LOS: 2 days

## 2022-07-03 DIAGNOSIS — I5022 Chronic systolic (congestive) heart failure: Secondary | ICD-10-CM | POA: Diagnosis not present

## 2022-07-03 DIAGNOSIS — I2699 Other pulmonary embolism without acute cor pulmonale: Secondary | ICD-10-CM | POA: Diagnosis not present

## 2022-07-03 DIAGNOSIS — J9601 Acute respiratory failure with hypoxia: Secondary | ICD-10-CM | POA: Diagnosis not present

## 2022-07-03 DIAGNOSIS — I10 Essential (primary) hypertension: Secondary | ICD-10-CM | POA: Diagnosis not present

## 2022-07-03 MED ORDER — APIXABAN 5 MG PO TABS: 10.0000 mg | ORAL_TABLET | Freq: Two times a day (BID) | ORAL | Status: DC

## 2022-07-03 MED ORDER — ALBUTEROL SULFATE HFA 108 (90 BASE) MCG/ACT IN AERS: 2.0000 | INHALATION_SPRAY | RESPIRATORY_TRACT | 1 refills | Status: DC | PRN

## 2022-07-03 MED ORDER — APIXABAN 5 MG PO TABS: 10.0000 mg | ORAL_TABLET | Freq: Two times a day (BID) | ORAL | 1 refills | Status: DC

## 2022-07-03 MED ORDER — ALBUTEROL SULFATE HFA 108 (90 BASE) MCG/ACT IN AERS: 2.0000 | INHALATION_SPRAY | RESPIRATORY_TRACT | Status: DC | PRN

## 2022-07-03 MED ORDER — APIXABAN 5 MG PO TABS: 5.0000 mg | ORAL_TABLET | Freq: Two times a day (BID) | ORAL | Status: DC

## 2022-07-03 NOTE — Progress Notes (Signed)
Patient discharging home with self-care. TOC consulted for HH/DME needs, medication assistance. No HH/DME needs identified. Patient is insured and TOC has no medicaiton assistance for insured patients.  TOC signing off.    Koda Routon, Clydene Pugh, LCSW

## 2022-07-03 NOTE — Care Management Important Message (Signed)
Important Message  Patient Details  Name: Ruben Young MRN: [id number] Date of Birth: Sep 07, 1950   Medicare Important Message Given:  Yes     Tommy Medal 07/03/2022, 11:51 AM

## 2022-07-03 NOTE — Discharge Summary (Addendum)
Physician Discharge Summary   Patient: Ruben Young MRN: [id number] DOB: 1951-01-20  Admit date:     06/30/2022  Discharge date: 07/03/22  Discharge Physician: Shanon Brow Alyza Artiaga   PCP: Iona Beard, MD   Recommendations at discharge:   Please follow up with primary care provider within 1-2 weeks  Please repeat BMP and CBC in one week    Hospital Course: 71 year old male with a history of hypertension, hyperlipidemia, coronary disease, AAA, COPD presenting with 2-week history of worsening dyspnea on exertion.  He states that he has had shortness of breath and dyspnea on exertion for the better part of the last 6 months but it has worsened in the last 2 weeks.  He describes some substernal chest discomfort associated with this that improves with rest.  He has not had any fevers, chills.  He complains of a chronic cough that has not worsened.  It is largely nonproductive.  He denies any hemoptysis.  The patient has chronic orthopnea and has to sleep on 3 pillows.  He denies any lower extremity edema or PND.  He states that he quit smoking about 3 months prior to this admission after nearly 30-pack-year history of tobacco.  He denies any nausea, vomiting, direct abdominal pain, dysuria, hematuria, hematochezia, melena.  He went to see his PCP 2 weeks prior to this admission.  Ambulatory pulse ox was checked on RA and the patient desaturated into the 80s.  The patient was given a sample of Breztri inhaler.  He states that he was not given any other medications.  He has not been on any other new medications.  He denies any recent travels or surgeries.  He denies any family history of blood clots or personal history of blood clots. In the ED, the patient was afebrile and hemodynamically stable.  Oxygen saturation was 91% on room air.  WBC 10.9, hemoglobin 14.5, platelets 216,000.  Sodium 139, potassium 3.3, bicarbonate 24, serum creatinine 1.01.  LFTs were unremarkable.  CTA chest was positive for bilateral  PE without any evidence of right heart strain.  There was interstitial coarsening of the left upper lobe.  There was emphysema and ASVD noted.  The patient was started on subcutaneous Lovenox and admitted for further evaluation and treatment of his dyspnea and PE.  Assessment and Plan: Acute respiratory failure with hypoxia -Secondary to pulmonary embolus in the setting of COPD -Stable on 3 L nasal cannula>>2L -Wean oxygen as tolerated for saturation greater 92% -weaned to RA -07/03/22--ambulatory pulse ox on RA did not show desturation < 88%   Acute pulmonary embolus -Remains hemodynamically stable -Continue subcutaneous Lovenox>>transition to apixaban prior to dc -12/2 Echo 50-55%, no WMA, normal RVF -Check factor V Leiden--pending at time of d/c -Prothrombin gene mutation--pending -Lupus anticoagulant--pending   Left popliteal DVT -AC as above   COPD -Start bronchodilators -continue Breztri -d/c home with albuterol rescue inhaler   Chronic systolic CHF -clinically compensated -09/25/12 Echo EF 40-45%, G1DD +WMA -07/01/22 Echo 50-55%, no WMA, normal RVF -continue coreg   Essential hypertension -Continue amlodipine and carvedilol -he's been on amlodipine for at least 10 years   Hyperlipidemia -Restart statin   AAA -06/01/22 [place] = 2.5cm -continue outpt surveillance         Consultants: none Procedures performed: none  Disposition: Home Diet recommendation:  Cardiac diet DISCHARGE MEDICATION: Allergies as of 07/03/2022   No Known Allergies      Medication List     TAKE these medications    albuterol 108 (  90 Base) MCG/ACT inhaler Commonly known as: VENTOLIN HFA Inhale 2 puffs into the lungs every 4 (four) hours as needed for wheezing or shortness of breath.   amLODipine 10 MG tablet Commonly known as: NORVASC TAKE 1 TABLET(10 MG) BY MOUTH DAILY What changed: See the new instructions.   apixaban 5 MG Tabs tablet Commonly known as: ELIQUIS Take 2  tablets (10 mg total) by mouth 2 (two) times daily. X 7 days, then 1 tab ('5mg'$ ) bid starting 07/10/22   ascorbic acid 500 MG tablet Commonly known as: VITAMIN C Take 500 mg by mouth daily.   aspirin EC 81 MG tablet Take 81 mg by mouth daily.   atorvastatin 40 MG tablet Commonly known as: LIPITOR TAKE 1 TABLET BY MOUTH DAILY AT 6 PM What changed:  how much to take how to take this when to take this   B COMPLEX PO Take 1 capsule by mouth every other day.   CALCIUM PO Take 1 tablet by mouth every other day.   carvedilol 6.25 MG tablet Commonly known as: COREG TAKE 1 TABLET(6.25 MG) BY MOUTH TWICE DAILY WITH A MEAL What changed: See the new instructions.   Fish Oil 1000 MG Caps Take 1,000 mg by mouth daily.   GINSENG PO Take 1 capsule by mouth every other day.   multivitamin with minerals Tabs tablet Take 1 tablet by mouth daily.   nitroGLYCERIN 0.4 MG SL tablet Commonly known as: NITROSTAT Place 1 tablet (0.4 mg total) under the tongue every 5 (five) minutes x 3 doses as needed for chest pain. For chest pains   POTASSIUM PO Take 1 tablet by mouth every other day.   Vitamin D3 25 MCG (1000 UT) Caps Take 1,000 Units by mouth daily.   vitamin E 180 MG (400 UNITS) capsule Take 400 Units by mouth daily.        Discharge Exam: Filed Weights   06/30/22 1455 06/30/22 2043  Weight: 78.9 kg 77.3 kg   HEENT:  [place]/AT, No thrush, no icterus CV:  RRR, no rub, no S3, no S4 Lung:  diminished BS.  No wheeze Abd:  soft/+BS, NT Ext:  No edema, no lymphangitis, no synovitis, no rash   Condition at discharge: stable  The results of significant diagnostics from this hospitalization (including imaging, microbiology, ancillary and laboratory) are listed below for reference.   Imaging Studies: ECHOCARDIOGRAM COMPLETE  Result Date: 07/01/2022    ECHOCARDIOGRAM REPORT   Patient Name:   Ruben Young Date of Exam: 07/01/2022 Medical Rec #:  [id number]         Height:        69.0 in Accession #:    [id number]        Weight:       170.4 lb Date of Birth:  1950-08-12        BSA:          1.930 m Patient Age:    52 years          BP:           141/85 mmHg Patient Gender: M                 HR:           77 bpm. Exam Location:  Inpatient Procedure: 2D Echo, Color Doppler, Cardiac Doppler and Intracardiac            Opacification Agent Indications:    Pulmonary Embolus I26.09  History:  Patient has prior history of Echocardiogram examinations, most                 recent 09/25/2012. COPD; Risk Factors:Hypertension, Dyslipidemia                 and Current Smoker. Abdominal aortic aneurysm.  Sonographer:    Darlina Sicilian RDCS Referring Phys: [phone] JACOB J STINSON  Sonographer Comments: Technically difficult study due to poor echo windows, suboptimal apical window and suboptimal subcostal window. IMPRESSIONS  1. Poor acoustic windows limit study.  2. Left ventricular ejection fraction, by estimation, is 50 to 55%. The left ventricle has normal function. The left ventricle has no regional wall motion abnormalities. Left ventricular diastolic parameters are indeterminate.  3. Right ventricular systolic function is normal. The right ventricular size is normal. There is normal pulmonary artery systolic pressure.  4. The mitral valve is grossly normal. No evidence of mitral valve regurgitation. No evidence of mitral stenosis.  5. The aortic valve was not well visualized. Aortic valve regurgitation is not visualized. No aortic stenosis is present.  6. The inferior vena cava is normal in size with greater than 50% respiratory variability, suggesting right atrial pressure of 3 mmHg. FINDINGS  Left Ventricle: Left ventricular ejection fraction, by estimation, is 50 to 55%. The left ventricle has normal function. The left ventricle has no regional wall motion abnormalities. Definity contrast agent was given IV to delineate the left ventricular  endocardial borders. The left ventricular internal  cavity size was normal in size. There is no left ventricular hypertrophy. Left ventricular diastolic parameters are indeterminate. Right Ventricle: The right ventricular size is normal. Right vetricular wall thickness was not well visualized. Right ventricular systolic function is normal. There is normal pulmonary artery systolic pressure. The tricuspid regurgitant velocity is 2.18 m/s, and with an assumed right atrial pressure of 15 mmHg, the estimated right ventricular systolic pressure is 54.0 mmHg. Left Atrium: Left atrial size was normal in size. Right Atrium: Right atrial size was normal in size. Pericardium: There is no evidence of pericardial effusion. Mitral Valve: The mitral valve is grossly normal. No evidence of mitral valve regurgitation. No evidence of mitral valve stenosis. Tricuspid Valve: The tricuspid valve is grossly normal. Tricuspid valve regurgitation is trivial. No evidence of tricuspid stenosis. Aortic Valve: The aortic valve was not well visualized. Aortic valve regurgitation is not visualized. No aortic stenosis is present. Pulmonic Valve: The pulmonic valve was grossly normal. Pulmonic valve regurgitation is not visualized. No evidence of pulmonic stenosis. Aorta: The aortic root is normal in size and structure. Venous: The inferior vena cava is normal in size with greater than 50% respiratory variability, suggesting right atrial pressure of 3 mmHg. IAS/Shunts: No atrial level shunt detected by color flow Doppler.  LEFT VENTRICLE PLAX 2D LVIDd:         4.00 cm   Diastology LVIDs:         2.90 cm   LV e' medial:    5.76 cm/s LV PW:         0.90 cm   LV E/e' medial:  9.1 LV IVS:        0.80 cm   LV e' lateral:   7.88 cm/s LVOT diam:     2.00 cm   LV E/e' lateral: 6.7 LVOT Area:     3.14 cm  LEFT ATRIUM             Index LA diam:  2.30 cm 1.19 cm/m LA Vol (A2C):   15.0 ml 7.77 ml/m LA Vol (A4C):   18.4 ml 9.53 ml/m LA Biplane Vol: 17.9 ml 9.27 ml/m   AORTA Ao Root diam: 2.90 cm  MITRAL VALVE               TRICUSPID VALVE MV Area (PHT): 2.33 cm    TR Peak grad:   19.0 mmHg MV Decel Time: 325 msec    TR Vmax:        218.00 cm/s MV E velocity: 52.70 cm/s MV A velocity: 87.40 cm/s  SHUNTS MV E/A ratio:  0.60        Systemic Diam: 2.00 cm Dorris Carnes MD Electronically signed by Dorris Carnes MD Signature Date/Time: 07/01/2022/2:10:24 PM    Final    US Venous Img Lower Bilateral (DVT)  Result Date: 07/01/2022 CLINICAL DATA:  71 year old male with history of acute pulmonary embolism. EXAM: BILATERAL LOWER EXTREMITY VENOUS DOPPLER ULTRASOUND TECHNIQUE: Gray-scale sonography with graded compression, as well as color Doppler and duplex ultrasound were performed to evaluate the lower extremity deep venous systems from the level of the common femoral vein and including the common femoral, femoral, profunda femoral, popliteal and calf veins including the posterior tibial, peroneal and gastrocnemius veins when visible. The superficial great saphenous vein was also interrogated. Spectral Doppler was utilized to evaluate flow at rest and with distal augmentation maneuvers in the common femoral, femoral and popliteal veins. COMPARISON:  CT a chest from 06/30/2022 FINDINGS: RIGHT LOWER EXTREMITY Common Femoral Vein: No evidence of thrombus. Normal compressibility, respiratory phasicity and response to augmentation. Saphenofemoral Junction: No evidence of thrombus. Normal compressibility and flow on color Doppler imaging. Profunda Femoral Vein: No evidence of thrombus. Normal compressibility and flow on color Doppler imaging. Femoral Vein: No evidence of thrombus. Normal compressibility, respiratory phasicity and response to augmentation. Popliteal Vein: No evidence of thrombus. Normal compressibility, respiratory phasicity and response to augmentation. Calf Veins: No evidence of thrombus. Normal compressibility and flow on color Doppler imaging. Other Findings:  None. LEFT LOWER EXTREMITY Common Femoral  Vein: No evidence of thrombus. Normal compressibility, respiratory phasicity and response to augmentation. Saphenofemoral Junction: No evidence of thrombus. Normal compressibility and flow on color Doppler imaging. Profunda Femoral Vein: No evidence of thrombus. Normal compressibility and flow on color Doppler imaging. Femoral Vein: No evidence of thrombus. Normal compressibility, respiratory phasicity and response to augmentation. Popliteal Vein: Heterogeneously hypoechoic, expansile, noncompressible, occlusive thrombus is present. Calf Veins: No evidence of thrombus. Normal compressibility and flow on color Doppler imaging. Other Findings:  None. IMPRESSION: Acute, occlusive deep vein thrombosis limited to the left popliteal vein. Ruthann Cancer, MD [facility] Electronically Signed   By: Ruthann Cancer M.D.   On: 07/01/2022 09:40   CT Angio Chest PE W and/or Wo Contrast  Result Date: 06/30/2022 CLINICAL DATA:  Concern for pulmonary embolism. EXAM: CT ANGIOGRAPHY CHEST WITH CONTRAST TECHNIQUE: Multidetector CT imaging of the chest was performed using the standard protocol during bolus administration of intravenous contrast. Multiplanar CT image reconstructions and MIPs were obtained to evaluate the vascular anatomy. RADIATION DOSE REDUCTION: This exam was performed according to the departmental dose-optimization program which includes automated exposure control, adjustment of the mA and/or kV according to patient size and/or use of iterative reconstruction technique. CONTRAST:  43m OMNIPAQUE IOHEXOL 350 MG/ML SOLN COMPARISON:  Chest radiograph dated 06/30/2022. FINDINGS: Evaluation of this exam is limited due to respiratory motion artifact. Cardiovascular: There is no  cardiomegaly or pericardial effusion. There is coronary vascular calcification of the LAD and RCA. Mild atherosclerotic calcification of the thoracic aorta. No aneurysmal dilatation or  dissection. The origins of the great vessels of the aortic arch appear patent as visualized. Evaluation of the pulmonary arteries is limited due to respiratory motion. Bilateral upper, and lower lobe pulmonary artery emboli involving the segmental and subsegmental branches. The right lower lobe pulmonary artery embolus extends from the lobar branch into the segmental and subsegmental branches. No CT evidence of right heart straining. Mediastinum/Nodes: No hilar or mediastinal adenopathy. The esophagus is grossly unremarkable. No mediastinal fluid collection. Lungs/Pleura: Background of emphysema. Areas of increased interstitial coarsening predominantly involving the left upper lobe may be chronic or due to atelectasis. Developing atypical infiltrate is not excluded. No consolidative changes. There is no pleural effusion or pneumothorax. The central airways are patent. No discrete nodule identified to correspond to the nodular shadow seen on the earlier radiograph. Upper Abdomen: No acute abnormality. Musculoskeletal: No acute osseous pathology. Review of the MIP images confirms the above findings. IMPRESSION: 1. Bilateral pulmonary artery emboli. No CT evidence of right heart straining. 2. Areas of increased interstitial coarsening predominantly involving the left upper lobe may be chronic or due to atelectasis. Developing atypical infiltrate is not excluded. 3. Aortic Atherosclerosis (ICD10-I70.0) and Emphysema (ICD10-J43.9). These results were called by telephone at the time of interpretation on 06/30/2022 at 5:55 pm to Dr. Noemi Chapel, who verbally acknowledged these results. Electronically Signed   By: Anner Crete M.D.   On: 06/30/2022 17:56   DG Chest Port 1 View  Result Date: 06/30/2022 CLINICAL DATA:  Worsening shortness of breath over several months EXAM: PORTABLE CHEST 1 VIEW COMPARISON:  09/24/2012 FINDINGS: Single frontal view of the chest demonstrates an unremarkable cardiac silhouette. There is  increased bilateral interstitial prominence, with patchy areas of airspace disease most pronounced in the perihilar regions left greater than right. 9 mm nodular area projects over the right anterior fifth intercostal space, which could reflect airspace disease or focal lung nodule. No effusion or pneumothorax. No acute bony abnormalities. IMPRESSION: 1. Increased interstitial and bilateral perihilar ground-glass airspace disease, favor atypical pneumonia over edema. 2. 9 mm nodular area of projecting over the right anterior fifth intercostal space. Differential diagnosis would include nipple shadow, nodular airspace disease, or underlying parenchymal lung nodule. Follow-up chest x-ray with nipple markers in 6-8 weeks after completion of appropriate medical management is recommended to document resolution. If this density persists at that time, and is not representative of a nipple shadow, CT chest could be performed. Electronically Signed   By: Randa Ngo M.D.   On: 06/30/2022 15:38    Microbiology: Results for orders placed or performed during the hospital encounter of 06/30/22  Resp Panel by RT-PCR (Flu A&B, Covid) Anterior Nasal Swab     Status: None   Collection Time: 06/30/22  3:26 PM   Specimen: Anterior Nasal Swab  Result Value Ref Range Status   SARS Coronavirus 2 by RT PCR NEGATIVE NEGATIVE Final    Comment: (NOTE) SARS-CoV-2 target nucleic acids are NOT DETECTED.  The SARS-CoV-2 RNA is generally detectable in upper respiratory specimens during the acute phase of infection. The lowest concentration of SARS-CoV-2 viral copies this assay can detect is 138 copies/mL. A negative result does not preclude SARS-Cov-2 infection and should not be used as the sole basis for treatment or other patient management decisions. A negative result may occur with  improper specimen collection/handling, submission of  specimen other than nasopharyngeal swab, presence of viral mutation(s) within  the areas targeted by this assay, and inadequate number of viral copies(<138 copies/mL). A negative result must be combined with clinical observations, patient history, and epidemiological information. The expected result is Negative.  Fact Sheet for Patients:  EntrepreneurPulse.com.au  Fact Sheet for Healthcare Providers:  IncredibleEmployment.be  This test is no t yet approved or cleared by the [place] FDA and  has been authorized for detection and/or diagnosis of SARS-CoV-2 by FDA under an Emergency Use Authorization (EUA). This EUA will remain  in effect (meaning this test can be used) for the duration of the COVID-19 declaration under Section 564(b)(1) of the Act, 21 U.S.C.section 360bbb-3(b)(1), unless the authorization is terminated  or revoked sooner.       Influenza A by PCR NEGATIVE NEGATIVE Final   Influenza B by PCR NEGATIVE NEGATIVE Final    Comment: (NOTE) The Xpert Xpress SARS-CoV-2/FLU/RSV plus assay is intended as an aid in the diagnosis of influenza from Nasopharyngeal swab specimens and should not be used as a sole basis for treatment. Nasal washings and aspirates are unacceptable for Xpert Xpress SARS-CoV-2/FLU/RSV testing.  Fact Sheet for Patients: EntrepreneurPulse.com.au  Fact Sheet for Healthcare Providers: IncredibleEmployment.be  This test is not yet approved or cleared by the [place] FDA and has been authorized for detection and/or diagnosis of SARS-CoV-2 by FDA under an Emergency Use Authorization (EUA). This EUA will remain in effect (meaning this test can be used) for the duration of the COVID-19 declaration under Section 564(b)(1) of the Act, 21 U.S.C. section 360bbb-3(b)(1), unless the authorization is terminated or revoked.  Performed at [facility], [address], [place], [place] [address]     Labs: CBC: Recent Labs  Lab 06/30/22 1439  07/02/22 0412  WBC 10.9* 8.1  NEUTROABS 7.0  --   HGB 14.5 13.6  HCT 43.4 42.7  MCV 87.7 89.9  PLT 216 641   Basic Metabolic Panel: Recent Labs  Lab 06/30/22 1439 07/02/22 0412  NA 139 141  K 3.3* 3.7  CL 106 108  CO2 24 26  GLUCOSE 94 107*  BUN 9 14  CREATININE 1.01 0.98  CALCIUM 8.7* 9.1  MG  --  2.2   Liver Function Tests: Recent Labs  Lab 06/30/22 1439  AST 19  ALT 15  ALKPHOS 86  BILITOT 0.4  PROT 7.4  ALBUMIN 3.3*   CBG: No results for input(s): "GLUCAP" in the last 168 hours.  Discharge time spent: greater than 30 minutes.  Signed: Orson Eva, MD [facility] 07/03/2022

## 2022-07-03 NOTE — Progress Notes (Signed)
ANTICOAGULATION CONSULT NOTE - Initial Consult  Pharmacy Consult for lovenox >> apixaban Indication: pulmonary embolus  No Known Allergies  Patient Measurements: Height: '5\' 9"'$  (175.3 cm) Weight: 77.3 kg (170 lb 6.7 oz) IBW/kg (Calculated) : 70.7 Heparin Dosing Weight:   Vital Signs: Temp: 98.3 F (36.8 C) (12/04 0853) Temp Source: Oral (12/04 0853) BP: 139/80 (12/04 0853) Pulse Rate: 72 (12/04 0853)  Labs: Recent Labs    06/30/22 1439 06/30/22 1526 07/02/22 0412  HGB 14.5  --  13.6  HCT 43.4  --  42.7  PLT 216  --  210  LABPROT 13.7  --   --   INR 1.1  --   --   CREATININE 1.01  --  0.98  TROPONINIHS  --  10  --     Estimated Creatinine Clearance: 67.1 mL/min (by C-G formula based on SCr of 0.98 mg/dL).   Medical History: Past Medical History:  Diagnosis Date   AAA (abdominal aortic aneurysm) (H[facility])    DDD (degenerative disc disease), lumbar    Hyperlipidemia    Hypertension    MI (myocardial infarction) ([place])    Old myocardial infarction    Tobacco use disorder     Medications:  Medications Prior to Admission  Medication Sig Dispense Refill Last Dose   amLODipine (NORVASC) 10 MG tablet TAKE 1 TABLET(10 MG) BY MOUTH DAILY (Patient taking differently: Take 10 mg by mouth daily.) 90 tablet 3 06/30/2022   aspirin EC 81 MG tablet Take 81 mg by mouth daily.   06/30/2022   atorvastatin (LIPITOR) 40 MG tablet TAKE 1 TABLET BY MOUTH DAILY AT 6 PM (Patient taking differently: Take 40 mg by mouth every evening. TAKE 1 TABLET BY MOUTH DAILY AT 6 PM) 90 tablet 2 06/29/2022   B Complex Vitamins (B COMPLEX PO) Take 1 capsule by mouth every other day.   Past Week   CALCIUM PO Take 1 tablet by mouth every other day.   Past Week   carvedilol (COREG) 6.25 MG tablet TAKE 1 TABLET(6.25 MG) BY MOUTH TWICE DAILY WITH A MEAL (Patient taking differently: Take 6.25 mg by mouth 2 (two) times daily with a meal.) 180 tablet 2 06/30/2022 at 0800   Cholecalciferol (VITAMIN D3) 25 MCG (1000  UT) CAPS Take 1,000 Units by mouth daily.    Past Week   GINSENG PO Take 1 capsule by mouth every other day.   Past Week   Multiple Vitamin (MULTIVITAMIN WITH MINERALS) TABS tablet Take 1 tablet by mouth daily.   06/29/2022   nitroGLYCERIN (NITROSTAT) 0.4 MG SL tablet Place 1 tablet (0.4 mg total) under the tongue every 5 (five) minutes x 3 doses as needed for chest pain. For chest pains 25 tablet 0 unknown   Omega-3 Fatty Acids (FISH OIL) 1000 MG CAPS Take 1,000 mg by mouth daily.    Past Week   POTASSIUM PO Take 1 tablet by mouth every other day.   Past Week   vitamin C (ASCORBIC ACID) 500 MG tablet Take 500 mg by mouth daily.   Past Week   vitamin E 400 UNIT capsule Take 400 Units by mouth daily.   Past Week    Assessment: Pharmacy consulted to transition patient from lovenox to apixaban in acute PE.  Patient last received lovenox 12/4 0856.   CBC WNL  Goal of Therapy:   Monitor platelets by anticoagulation protocol: Yes   Plan:  Stop lovenox Start apixaban 10 mg twice daily starting @ 2200 x 7 days  followed by apixaban 5 mg twice daily. Monitor H&H and s/s of bleeding.  Revonda Standard Chiante Peden 07/03/2022,10:33 AM

## 2022-07-03 NOTE — Discharge Instructions (Signed)
Information on my medicine - ELIQUIS (apixaban)  This medication education was reviewed with me or my healthcare representative as part of my discharge preparation.  The pharmacist that spoke with me during my hospital stay was:  [place] Silveria C Danial Sisley, RPH  Why was Eliquis prescribed for you? Eliquis was prescribed to treat blood clots that may have been found in the veins of your legs (deep vein thrombosis) or in your lungs (pulmonary embolism) and to reduce the risk of them occurring again.  What do You need to know about Eliquis ? The starting dose is 10 mg (two 5 mg tablets) taken TWICE daily for the FIRST SEVEN (7) DAYS, then  the dose is reduced to ONE 5 mg tablet taken TWICE daily.  Eliquis may be taken with or without food.   Try to take the dose about the same time in the morning and in the evening. If you have difficulty swallowing the tablet whole please discuss with your pharmacist how to take the medication safely.  Take Eliquis exactly as prescribed and DO NOT stop taking Eliquis without talking to the doctor who prescribed the medication.  Stopping may increase your risk of developing a new blood clot.  Refill your prescription before you run out.  After discharge, you should have regular check-up appointments with your healthcare provider that is prescribing your Eliquis.    What do you do if you miss a dose? If a dose of ELIQUIS is not taken at the scheduled time, take it as soon as possible on the same day and twice-daily administration should be resumed. The dose should not be doubled to make up for a missed dose.  Important Safety Information A possible side effect of Eliquis is bleeding. You should call your healthcare provider right away if you experience any of the following: ? Bleeding from an injury or your nose that does not stop. ? Unusual colored urine (red or dark brown) or unusual colored stools (red or black). ? Unusual bruising for unknown reasons. ? A  serious fall or if you hit your head (even if there is no bleeding).  Some medicines may interact with Eliquis and might increase your risk of bleeding or clotting while on Eliquis. To help avoid this, consult your healthcare provider or pharmacist prior to using any new prescription or non-prescription medications, including herbals, vitamins, non-steroidal anti-inflammatory drugs (NSAIDs) and supplements.  This website has more information on Eliquis (apixaban): http://www.eliquis.com/eliquis/home  

## 2022-07-03 NOTE — Progress Notes (Signed)
Patient discharged home with instructions given on medications and follow up visits,patient verbalized understanding. Prescriptions sent to Pharmacy of choice documented on AVS. IV discontinued, catheter intact. Accompanied by staff to an awaiting vehicle. 

## 2022-07-04 ENCOUNTER — Ambulatory Visit: Payer: 59 | Admitting: Physician Assistant

## 2022-07-04 ENCOUNTER — Ambulatory Visit: Payer: 59 | Admitting: Nurse Practitioner

## 2022-07-04 ENCOUNTER — Telehealth: Payer: Self-pay | Admitting: *Deleted

## 2022-07-04 NOTE — Telephone Encounter (Signed)
Pt called and had questions about his prep and what he needed to start doing. I read the instructions to the pt and what he needed to get over the counter to go along with the prep. Pt verbalized understanding. Stated if he had anymore questions he would call

## 2022-07-05 LAB — DRVVT MIX: dRVVT Mix: 44.8 s — ABNORMAL HIGH (ref 0.0–40.4)

## 2022-07-05 LAB — PTT-LA MIX: PTT-LA Mix: 59 s — ABNORMAL HIGH (ref 0.0–40.5)

## 2022-07-05 LAB — HEXAGONAL PHASE PHOSPHOLIPID: Hexagonal Phase Phospholipid: 9 s (ref 0–11)

## 2022-07-05 LAB — LUPUS ANTICOAGULANT
DRVVT: 53.6 s — ABNORMAL HIGH (ref 0.0–47.0)
PTT Lupus Anticoagulant: 58.5 s — ABNORMAL HIGH (ref 0.0–43.5)
Thrombin Time: 23.1 s — ABNORMAL HIGH (ref 0.0–23.0)
dPT Confirm Ratio: 1.06 Ratio (ref 0.00–1.34)
dPT: 43.2 s (ref 0.0–47.6)

## 2022-07-05 LAB — DRVVT CONFIRM: dRVVT Confirm: 1.2 ratio (ref 0.8–1.2)

## 2022-07-06 LAB — PROTHROMBIN GENE MUTATION

## 2022-07-06 LAB — FACTOR 5 LEIDEN

## 2022-07-12 ENCOUNTER — Telehealth: Payer: Self-pay | Admitting: *Deleted

## 2022-07-12 NOTE — Telephone Encounter (Addendum)
Per Dr. Abbey Chatters cancel upcoming procedure and we will need to see him in the office in 3 months to re-evaluate. Susan/amanda please schedule. Thanks   Pt also aware procedure cancelled. Pre-op appt for tomorrow also cancelled.

## 2022-07-12 NOTE — Telephone Encounter (Signed)
Patient was triaged back in November and was scheduled for procedure next week. Was recently seen in hospital and had bilateral PE. Started on eliquis. Dr. Abbey Chatters, please advise if pt should cancel and come in for eval prior to rescheduling?

## 2022-07-12 NOTE — Patient Instructions (Signed)
Ruben Young  07/12/2022     '@PREFPERIOPPHARMACY'$ @   Your procedure is scheduled on 07/17/2022.  Report to [facility] at 9:15 A.M.  Call this number if you have problems the morning of surgery:  [phone]  If you experience any cold or flu symptoms such as cough, fever, chills, shortness of breath, etc. between now and your scheduled surgery, please notify us at the above number.   Remember:    Please follow the diet and prep instructions given to you by Dr Ave Filter office.    Take these medicines the morning of surgery with A SIP OF WATER : Norvasc Carvedilol   Please use your Albuterol Inhaler before coming to the hospital.    Do not wear jewelry, make-up or nail polish.  Do not wear lotions, powders, or perfumes, or deodorant.  Do not shave 48 hours prior to surgery.  Men may shave face and neck.  Do not bring valuables to the hospital.  [facility] is not responsible for any belongings or valuables.  Contacts, dentures or bridgework may not be worn into surgery.  Leave your suitcase in the car.  After surgery it may be brought to your room.  For patients admitted to the hospital, discharge time will be determined by your treatment team.  Patients discharged the day of surgery will not be allowed to drive home.   Name and phone number of your driver:   Family Special instructions:  N/A  Please read over the following fact sheets that you were given.   Care and Recovery After Surgery Colonoscopy, Adult A colonoscopy is a procedure to look at the entire large intestine. This procedure is done using a long, thin, flexible tube that has a camera on the end. You may have a colonoscopy: As a part of normal colorectal screening. If you have certain symptoms, such as: A low number of red blood cells in your blood (anemia). Diarrhea that does not go away. Pain in your abdomen. Blood in your stool. A colonoscopy can help screen for and diagnose medical problems,  including: An abnormal growth of cells or tissue (tumor). Abnormal growths within the lining of your intestine (polyps). Inflammation. Areas of bleeding. Tell your health care provider about: Any allergies you have. All medicines you are taking, including vitamins, herbs, eye drops, creams, and over-the-counter medicines. Any problems you or family members have had with anesthetic medicines. Any bleeding problems you have. Any surgeries you have had. Any medical conditions you have. Any problems you have had with having bowel movements. Whether you are pregnant or may be pregnant. What are the risks? Generally, this is a safe procedure. However, problems may occur, including: Bleeding. Damage to your intestine. Allergic reactions to medicines given during the procedure. Infection. This is rare. What happens before the procedure? Eating and drinking restrictions Follow instructions from your health care provider about eating or drinking restrictions, which may include: A few days before the procedure: Follow a low-fiber diet. Avoid nuts, seeds, dried fruit, raw fruits, and vegetables. 1-3 days before the procedure: Eat only gelatin dessert or ice pops. Drink only clear liquids, such as water, clear juice, clear broth or bouillon, black coffee or tea, or clear soft drinks or sports drinks. Avoid liquids that contain red or purple dye. The day of the procedure: Do not eat solid foods. You may continue to drink clear liquids until up to 2 hours before the procedure. Do not eat or drink anything starting 2 hours  before the procedure, or within the time period that your health care provider recommends. Bowel prep If you were prescribed a bowel prep to take by mouth (orally) to clean out your colon: Take it as told by your health care provider. Starting the day before your procedure, you will need to drink a large amount of liquid medicine. The liquid will cause you to have many bowel  movements of loose stool until your stool becomes almost clear or light green. If your skin or the opening between the buttocks (anus) gets irritated from diarrhea, you may relieve the irritation using: Wipes with medicine in them, such as adult wet wipes with aloe and vitamin E. A product to soothe skin, such as petroleum jelly. If you vomit while drinking the bowel prep: Take a break for up to 60 minutes. Begin the bowel prep again. Call your health care provider if you keep vomiting or you cannot take the bowel prep without vomiting. To clean out your colon, you may also be given: Laxative medicines. These help you have a bowel movement. Instructions for enema use. An enema is liquid medicine injected into your rectum. Medicines Ask your health care provider about: Changing or stopping your regular medicines or supplements. This is especially important if you are taking iron supplements, diabetes medicines, or blood thinners. Taking medicines such as aspirin and ibuprofen. These medicines can thin your blood. Do not take these medicines unless your health care provider tells you to take them. Taking over-the-counter medicines, vitamins, herbs, and supplements. General instructions Ask your health care provider what steps will be taken to help prevent infection. These may include washing skin with a germ-killing soap. If you will be going home right after the procedure, plan to have a responsible adult: Take you home from the hospital or clinic. You will not be allowed to drive. Care for you for the time you are told. What happens during the procedure?  An IV will be inserted into one of your veins. You will be given a medicine to make you fall asleep (general anesthetic). You will lie on your side with your knees bent. A lubricant will be put on the tube. Then the tube will be: Inserted into your anus. Gently eased through all parts of your large intestine. Air will be sent into your  colon to keep it open. This may cause some pressure or cramping. Images will be taken with the camera and will appear on a screen. A small tissue sample may be removed to be looked at under a microscope (biopsy). The tissue may be sent to a lab for testing if any signs of problems are found. If small polyps are found, they may be removed and checked for cancer cells. When the procedure is finished, the tube will be removed. The procedure may vary among health care providers and hospitals. What happens after the procedure? Your blood pressure, heart rate, breathing rate, and blood oxygen level will be monitored until you leave the hospital or clinic. You may have a small amount of blood in your stool. You may pass gas and have mild cramping or bloating in your abdomen. This is caused by the air that was used to open your colon during the exam. If you were given a sedative during the procedure, it can affect you for several hours. Do not drive or operate machinery until your health care provider says that it is safe. It is up to you to get the results of your procedure.  Ask your health care provider, or the department that is doing the procedure, when your results will be ready. Summary A colonoscopy is a procedure to look at the entire large intestine. Follow instructions from your health care provider about eating and drinking before the procedure. If you were prescribed an oral bowel prep to clean out your colon, take it as told by your health care provider. During the colonoscopy, a flexible tube with a camera on its end is inserted into the anus and then passed into all parts of the large intestine. This information is not intended to replace advice given to you by your health care provider. Make sure you discuss any questions you have with your health care provider. Document Revised: 07/11/2021 Document Reviewed: 03/09/2021 Elsevier Patient Education  [place] Anesthesia refers to the techniques, procedures, and medicines that help a person stay safe and comfortable during surgery. Monitored anesthesia care, or sedation, is one type of anesthesia. You may have sedation if you do not need to be asleep for your procedure. Procedures that use sedation may include: Surgery to remove cataracts from your eyes. A dental procedure. A biopsy. This is when a tissue sample is removed and looked at under a microscope. You will be watched closely during your procedure. Your level of sedation or type of anesthesia may be changed to fit your needs. Tell a health care provider about: Any allergies you have. All medicines you are taking, including vitamins, herbs, eye drops, creams, and over-the-counter medicines. Any problems you or family members have had with anesthesia. Any bleeding problems you have. Any surgeries you have had. Any medical conditions or illnesses you have. This includes sleep apnea, cough, fever, or the flu. Whether you are pregnant or may be pregnant. Whether you use cigarettes, alcohol, or drugs. Any use of steroids, whether by mouth or as a cream. What are the risks? Your health care provider will talk with you about risks. These may include: Getting too much medicine (oversedation). Nausea. Allergic reactions to medicines. Trouble breathing. If this happens, a breathing tube may be used to help you breathe. It will be removed when you are awake and breathing on your own. Heart trouble. Lung trouble. Confusion that gets better with time (emergence delirium). What happens before the procedure? When to stop eating and drinking Follow instructions from your health care provider about what you may eat and drink. These may include: 8 hours before your procedure Stop eating most foods. Do not eat meat, fried foods, or fatty foods. Eat only light foods, such as toast or crackers. All liquids are okay except energy drinks and  alcohol. 6 hours before your procedure Stop eating. Drink only clear liquids, such as water, clear fruit juice, black coffee, plain tea, and sports drinks. Do not drink energy drinks or alcohol. 2 hours before your procedure Stop drinking all liquids. You may be allowed to take medicines with small sips of water. If you do not follow your health care provider's instructions, your procedure may be delayed or canceled. Medicines Ask your health care provider about: Changing or stopping your regular medicines. These include any diabetes medicines or blood thinners you take. Taking medicines such as aspirin and ibuprofen. These medicines can thin your blood. Do not take them unless your health care provider tells you to. Taking over-the-counter medicines, vitamins, herbs, and supplements. Testing You may have an exam or testing. You may have a blood or urine sample taken. General  instructions Do not use any products that contain nicotine or tobacco for at least 4 weeks before the procedure. These products include cigarettes, chewing tobacco, and vaping devices, such as e-cigarettes. If you need help quitting, ask your health care provider. If you will be going home right after the procedure, plan to have a responsible adult: Take you home from the hospital or clinic. You will not be allowed to drive. Care for you for the time you are told. What happens during the procedure?  Your blood pressure, heart rate, breathing, level of pain, and blood oxygen level will be monitored. An IV will be inserted into one of your veins. You may be given: A sedative. This helps you relax. Anesthesia. This will: Numb certain areas of your body. Make you fall asleep for surgery. You will be given medicines as needed to keep you comfortable. The more medicine you are given, the deeper your level of sedation will be. Your level of sedation may be changed to fit your needs. There are three levels of  sedation: Mild sedation. At this level, you may feel awake and relaxed. You will be able to follow directions. Moderate sedation. At this level, you will be sleepy. You may not remember the procedure. Deep sedation. At this level, you will be asleep. You will not remember the procedure. How you get the medicines will depend on your age and the procedure. They may be given as: A pill. This may be taken by mouth (orally) or inserted into the rectum. An injection. This may be into a vein or muscle. A spray through the nose. After your procedure is over, the medicine will be stopped. The procedure may vary among health care providers and hospitals. What happens after the procedure? Your blood pressure, heart rate, breathing rate, and blood oxygen level will be monitored until you leave the hospital or clinic. You may feel sleepy, clumsy, or nauseous. You may not remember what happened during or after the procedure. Sedation can affect you for several hours. Do not drive or use machinery until your health care provider says that it is safe. This information is not intended to replace advice given to you by your health care provider. Make sure you discuss any questions you have with your health care provider. Document Revised: 12/12/2021 Document Reviewed: 12/12/2021 Elsevier Patient Education  [place].

## 2022-07-13 ENCOUNTER — Encounter (HOSPITAL_COMMUNITY)
Admission: RE | Admit: 2022-07-13 | Discharge: 2022-07-13 | Disposition: A | Discharge status: Home / Self Care | Payer: 59 | Source: Ambulatory Visit | Attending: Internal Medicine | Admitting: Internal Medicine

## 2022-07-17 ENCOUNTER — Ambulatory Visit (HOSPITAL_COMMUNITY): Admission: RE | Admit: 2022-07-17 | Payer: 59 | Source: Home / Self Care

## 2022-07-17 ENCOUNTER — Encounter (HOSPITAL_COMMUNITY): Admission: RE | Payer: Self-pay | Source: Home / Self Care

## 2022-07-17 SURGERY — COLONOSCOPY WITH PROPOFOL
Anesthesia: Monitor Anesthesia Care

## 2022-08-15 DIAGNOSIS — I5022 Chronic systolic (congestive) heart failure: Secondary | ICD-10-CM | POA: Diagnosis not present

## 2022-08-15 DIAGNOSIS — Z7901 Long term (current) use of anticoagulants: Secondary | ICD-10-CM | POA: Diagnosis not present

## 2022-08-15 DIAGNOSIS — I110 Hypertensive heart disease with heart failure: Secondary | ICD-10-CM | POA: Diagnosis not present

## 2022-08-15 DIAGNOSIS — I723 Aneurysm of iliac artery: Secondary | ICD-10-CM | POA: Diagnosis not present

## 2022-08-15 DIAGNOSIS — I2609 Other pulmonary embolism with acute cor pulmonale: Secondary | ICD-10-CM | POA: Diagnosis not present

## 2022-08-15 DIAGNOSIS — I2510 Atherosclerotic heart disease of native coronary artery without angina pectoris: Secondary | ICD-10-CM | POA: Diagnosis not present

## 2022-08-15 DIAGNOSIS — E781 Pure hyperglyceridemia: Secondary | ICD-10-CM | POA: Diagnosis not present

## 2022-08-29 DIAGNOSIS — E7849 Other hyperlipidemia: Secondary | ICD-10-CM | POA: Diagnosis not present

## 2022-08-29 DIAGNOSIS — I25119 Atherosclerotic heart disease of native coronary artery with unspecified angina pectoris: Secondary | ICD-10-CM | POA: Diagnosis not present

## 2022-08-29 DIAGNOSIS — I10 Essential (primary) hypertension: Secondary | ICD-10-CM | POA: Diagnosis not present

## 2022-09-12 DIAGNOSIS — E781 Pure hyperglyceridemia: Secondary | ICD-10-CM | POA: Diagnosis not present

## 2022-09-12 DIAGNOSIS — I110 Hypertensive heart disease with heart failure: Secondary | ICD-10-CM | POA: Diagnosis not present

## 2022-09-12 DIAGNOSIS — I10 Essential (primary) hypertension: Secondary | ICD-10-CM | POA: Diagnosis not present

## 2022-09-12 DIAGNOSIS — I2510 Atherosclerotic heart disease of native coronary artery without angina pectoris: Secondary | ICD-10-CM | POA: Diagnosis not present

## 2022-09-12 DIAGNOSIS — Z7901 Long term (current) use of anticoagulants: Secondary | ICD-10-CM | POA: Diagnosis not present

## 2022-09-12 DIAGNOSIS — I723 Aneurysm of iliac artery: Secondary | ICD-10-CM | POA: Diagnosis not present

## 2022-09-12 DIAGNOSIS — I5022 Chronic systolic (congestive) heart failure: Secondary | ICD-10-CM | POA: Diagnosis not present

## 2022-09-12 DIAGNOSIS — I2609 Other pulmonary embolism with acute cor pulmonale: Secondary | ICD-10-CM | POA: Diagnosis not present

## 2022-09-26 ENCOUNTER — Encounter: Payer: Self-pay | Admitting: Cardiology

## 2022-09-26 ENCOUNTER — Ambulatory Visit: Payer: Medicare HMO | Attending: Cardiology | Admitting: Cardiology

## 2022-09-26 ENCOUNTER — Encounter: Payer: Self-pay | Admitting: *Deleted

## 2022-09-26 VITALS — BP 115/69 | HR 66 | Ht 69.0 in | Wt 182.0 lb

## 2022-09-26 DIAGNOSIS — I2699 Other pulmonary embolism without acute cor pulmonale: Secondary | ICD-10-CM

## 2022-09-26 DIAGNOSIS — I723 Aneurysm of iliac artery: Secondary | ICD-10-CM

## 2022-09-26 DIAGNOSIS — J449 Chronic obstructive pulmonary disease, unspecified: Secondary | ICD-10-CM

## 2022-09-26 DIAGNOSIS — Z720 Tobacco use: Secondary | ICD-10-CM | POA: Diagnosis not present

## 2022-09-26 DIAGNOSIS — I5022 Chronic systolic (congestive) heart failure: Secondary | ICD-10-CM | POA: Diagnosis not present

## 2022-09-26 DIAGNOSIS — R0602 Shortness of breath: Secondary | ICD-10-CM | POA: Diagnosis not present

## 2022-09-26 NOTE — Patient Instructions (Addendum)
Medication Instructions:  The current medical regimen is effective;  continue present plan and medications.  *If you need a refill on your cardiac medications before your next appointment, please call your pharmacy*  Testing/Procedures: Your physician has requested that you have a lexiscan myoview. For further information please visit [email or web address]. Please follow instruction sheet, as given.  You have been referred to Pulmonary for further evaluation and treatment of COPD.  Follow-Up: At [facility], you and your health needs are our priority.  As part of our continuing mission to provide you with exceptional heart care, we have created designated Provider Care Teams.  These Care Teams include your primary Cardiologist (physician) and Advanced Practice Providers (APPs -  Physician Assistants and Nurse Practitioners) who all work together to provide you with the care you need, when you need it.  We recommend signing up for the patient portal called "MyChart".  Sign up information is provided on this After Visit Summary.  MyChart is used to connect with patients for Virtual Visits (Telemedicine).  Patients are able to view lab/test results, encounter notes, upcoming appointments, etc.  Non-urgent messages can be sent to your provider as well.   To learn more about what you can do with MyChart, go to [email or web address].    Your next appointment:   6 month(s)  Provider:   With any NP/PA or Candee Furbish, MD

## 2022-09-26 NOTE — Progress Notes (Signed)
Cardiology Office Note:    Date:  09/26/2022   ID:  Ruben Young, DOB September 16, 1950, MRN [id number]  PCP:  Ruben Beard, MD  [facility] Cardiologist:  Ruben Furbish, MD  [facility] Electrophysiologist:  None   Referring MD: Ruben Beard, MD   History of Present Illness:    Ruben Young is a 72 y.o. male with CAD, s/p PCI to RCA in the past, anterior STEMI in 08/2012 tx with BMS to LAD, ischemic CM,  chronic systolic heart failure ,HTN, HL.     2.63 cm left common iliac artery aneurysm, Dr. Scot Young, normally consider elective repair with reaches 3.5 cm.   Had pulmonary embolism in early December 2023.  On apixaban.  Continue.  Bilateral PE.  CT also showed dense of COPD.  He is still having ongoing shortness of breath with activity.  No significant chest pain currently.   Past Medical History:  Diagnosis Date   AAA (abdominal aortic aneurysm) (HCC)    DDD (degenerative disc disease), lumbar    Hyperlipidemia    Hypertension    MI (myocardial infarction) ([place])    Old myocardial infarction    Tobacco use disorder     Past Surgical History:  Procedure Laterality Date   COLONOSCOPY N/A 10/01/2015   Procedure: COLONOSCOPY;  Surgeon: Ruben Binder, MD;  [facility];  Service: Endoscopy;  Laterality: N/A;  245   COLONOSCOPY N/A 04/09/2019   Procedure: COLONOSCOPY;  Surgeon: Ruben Binder, MD;  [facility];  Service: Endoscopy;  Laterality: N/A;  12:00   CORONARY ANGIOPLASTY WITH STENT PLACEMENT     EXCISION MASS LOWER EXTREMETIES Left 01/28/2016   Procedure: EXCISION LEFT BUTTOCKS SINUS TRACH;  Surgeon: Ruben Overall, MD;  Location: [facility] OR[facility];  Service: General;  Laterality: Left;   LEFT HEART CATHETERIZATION WITH CORONARY ANGIOGRAM N/A 09/24/2012   Procedure: LEFT HEART CATHETERIZATION WITH CORONARY ANGIOGRAM;  Surgeon: Ruben Booze, MD;  Location: [facility] CATH LAB;  Service: Cardiovascular;  Laterality: N/A;   POLYPECTOMY  04/09/2019   Procedure:  POLYPECTOMY;  Surgeon: Ruben Binder, MD;  [facility];  Service: Endoscopy;;   TONSILLECTOMY      Current Medications: Current Meds  Medication Sig   albuterol (VENTOLIN HFA) 108 (90 Base) MCG/ACT inhaler Inhale 2 puffs into the lungs every 4 (four) hours as needed for wheezing or shortness of breath.   amLODipine (NORVASC) 10 MG tablet TAKE 1 TABLET(10 MG) BY MOUTH DAILY   apixaban (ELIQUIS) 5 MG TABS tablet Take 2 tablets (10 mg total) by mouth 2 (two) times daily. X 7 days, then 1 tab ('5mg'$ ) bid starting 07/10/22 (Patient taking differently: Take 5 mg by mouth 2 (two) times daily.)   aspirin EC 81 MG tablet Take 81 mg by mouth daily.   atorvastatin (LIPITOR) 40 MG tablet TAKE 1 TABLET BY MOUTH DAILY AT 6 PM   B Complex Vitamins (B COMPLEX PO) Take 1 capsule by mouth every other day.   CALCIUM PO Take 1 tablet by mouth every other day.   carvedilol (COREG) 6.25 MG tablet TAKE 1 TABLET(6.25 MG) BY MOUTH TWICE DAILY WITH A MEAL   Cholecalciferol (VITAMIN D3) 25 MCG (1000 UT) CAPS Take 1,000 Units by mouth daily.    ferrous sulfate 325 (65 FE) MG EC tablet Take 325 mg by mouth every other day.   GINSENG PO Take 1 capsule by mouth every other day.   hydrochlorothiazide (HYDRODIURIL) 25 MG tablet Take 25  mg by mouth daily.   hydrocortisone cream 1 % Apply 1 Application topically daily as needed for itching.   Multiple Vitamin (MULTIVITAMIN WITH MINERALS) TABS tablet Take 1 tablet by mouth daily.   nitroGLYCERIN (NITROSTAT) 0.4 MG SL tablet Place 1 tablet (0.4 mg total) under the tongue every 5 (five) minutes x 3 doses as needed for chest pain. For chest pains   Omega-3 Fatty Acids (FISH OIL) 1000 MG CAPS Take 1,000 mg by mouth every other day.   POTASSIUM PO Take 1 tablet by mouth every other day.   vitamin C (ASCORBIC ACID) 500 MG tablet Take 500 mg by mouth every other day.   vitamin E 400 UNIT capsule Take 400 Units by mouth every other day.     Allergies:   Patient has no  known allergies.   Social History   Socioeconomic History   Marital status: Single    Spouse name: Not on file   Number of children: Not on file   Years of education: Not on file   Highest education level: Not on file  Occupational History   Not on file  Tobacco Use   Smoking status: Every Day    Packs/day: 0.25    Years: 48.00    Total pack years: 12.00    Types: Cigarettes   Smokeless tobacco: Never   Tobacco comments:    1 pk lasts 3-4 days  Vaping Use   Vaping Use: Never used  Substance and Sexual Activity   Alcohol use: No    Alcohol/week: 0.0 standard drinks of alcohol   Drug use: No    Types: Cocaine    Comment: per patient has not used in 4 years   Sexual activity: Not on file  Other Topics Concern   Not on file  Social History Narrative   Not on file   Social Determinants of Health   Financial Resource Strain: Not on file  Food Insecurity: No Food Insecurity (06/30/2022)   Hunger Vital Sign    Worried About Running Out of Food in the Last Year: Never true    Ran Out of Food in the Last Year: Never true  Transportation Needs: No Transportation Needs (06/30/2022)   PRAPARE - Hydrologist (Medical): No    Lack of Transportation (Non-Medical): No  Physical Activity: Not on file  Stress: Not on file  Social Connections: Not on file     Family History: The patient's family history includes Cancer in his mother; Heart attack in his mother. There is no history of Colon cancer.  ROS:   Please see the history of present illness.    (+) Dyspnea on exertion (+) Fatigue All other systems reviewed and are negative.  EKGs/Labs/Other Studies Reviewed:   Studies: Abdominal Aorta Study 06/30/21 Abdominal Aorta: No evidence of an abdominal aortic aneurysm was  visualized.   There is evidence of abnormal dilation of the Right Common Iliac artery.  The maximum right common iliac artery diameter measurement is 2.3 cm. This  is  essentially unchanged compared to the prior exam. Previous diameter  measurement was 2.4 cm obtained on  10/29/2019.   The largest aortic measurement is 2.1 cm. The largest aortic diameter  remains essentially unchanged compared to prior exam. Previous diameter  measurement was 2.3 cm obtained on 10/29/2019.    - LHC (08/2012):  Ant STEMI >>> mid LAD occluded, mid RCA stent ok.  PCI:  BMS to mid LAD.    -  Echo (08/2012):  Mild LVH, EF 40-45%, AS dyskinesis, Gr 1 DD.  - ECHO (06/2022): EF 50 to 55%  EKG:  EKG was personally reviewed 09/20/21: Sinus rhythm, rate 63 bpm with PAC 02/09/20: sinus rhythm 66 with PAC no other abnormalities  Recent Labs: 06/30/2022: ALT 15; B Natriuretic Peptide 34.0 07/02/2022: BUN 14; Creatinine, Ser 0.98; Hemoglobin 13.6; Magnesium 2.2; Platelets 210; Potassium 3.7; Sodium 141  Recent Lipid Panel    Component Value Date/Time   CHOL 104 04/14/2014 1551   TRIG 58.0 04/14/2014 1551   HDL 29.70 (L) 04/14/2014 1551   CHOLHDL 4 04/14/2014 1551   VLDL 11.6 04/14/2014 1551   LDLCALC 63 04/14/2014 1551    Physical Exam:    VS:  BP 115/69 (BP Location: Left Arm, Patient Position: Sitting, Cuff Size: Normal)   Pulse 66   Ht '5\' 9"'$  (1.753 m)   Wt 182 lb (82.6 kg)   SpO2 95%   BMI 26.88 kg/m     Wt Readings from Last 3 Encounters:  09/26/22 182 lb (82.6 kg)  06/30/22 170 lb 6.7 oz (77.3 kg)  06/01/22 175 lb (79.4 kg)    GEN: Well nourished, well developed, in no acute distress HEENT: normal Neck: no JVD, carotid bruits, or masses Cardiac: RRR; no murmurs, rubs, or gallops,no edema  Respiratory:  clear to auscultation bilaterally, normal work of breathing GI: soft, nontender, nondistended, + BS MS: no deformity or atrophy Skin: warm and dry, no rash Neuro:  Alert and Oriented x 3, Strength and sensation are intact Psych: euthymic mood, full affect   ASSESSMENT:    1. Chronic obstructive pulmonary disease, unspecified COPD type ([place])   2. Shortness  of breath   3. Aneurysm of right common iliac artery (HCC)   4. Chronic systolic heart failure ([place])   5. Tobacco abuse   6. Pulmonary embolism without acute cor pulmonale, unspecified chronicity, unspecified pulmonary embolism type (HCC)      PLAN:    Bilateral pulmonary embolism 06/30/2022: 07/03/2022-no evidence of right heart strain.  On Eliquis.  Treated for COPD as well.  Chronic systolic heart failure (HCC) Ischemic cardiomyopathy from 2014 anterior STEMI with mid LAD occlusion.  Had bare-metal stent to mid LAD.  EF was 40 to 45% at that time.  On goal-directed medical therapy which includes carvedilol. Now EF 55%.  NYHA II   Coronary atherosclerosis of native coronary artery Young doing well with bare-metal stent to the LAD in the setting of prior anterior STEMI in 2014.  Continue with goal-directed medical therapy.  He is on aspirin, statin, beta-blocker. Given his ongoing fatigue shortness of breath, we will check a pharmacologic stress test.  Is been 3 months since his PE.  I think is reasonable to proceed.  Aneurysm of iliac artery (HCC) Right common iliac 2.4 cm, left common iliac 2.0 cm.  Followed by vascular.  Asymptomatic.  Reviewed note from vascular surgery, would not consider repair until they reached 3.5 cm in maximal diameter.  Old MI (myocardial infarction) Old anterior MI in 2014 with LAD bare-metal stent  HLD (hyperlipidemia) Atorvastatin 40 mg.  Last LDL 50.  Continue to monitor.  Tobacco abuse/COPD Continue to encourage tobacco cessation.  States that he is doing better with this.  He is only smoked 1 to 2 cigarettes over the past 3 months continuing to encourage complete cessation. Given his findings of COPD like pattern on CT scan and shortness of breath, I will go ahead and refer him to pulmonary  medicine for evaluation  Fatigue No changes from prior.  Continue to encourage exercise, walking, smoking cessation.  Stress test as above.  Medication  Adjustments/Labs and Tests Ordered: Current medicines are reviewed at length with the patient today.  Concerns regarding medicines are outlined above.  Orders Placed This Encounter  Procedures   Ambulatory referral to Pulmonology   MYOCARDIAL PERFUSION IMAGING   No orders of the defined types were placed in this encounter.   Patient Instructions  Medication Instructions:  The current medical regimen is effective;  continue present plan and medications.  *If you need a refill on your cardiac medications before your next appointment, please call your pharmacy*  Testing/Procedures: Your physician has requested that you have a lexiscan myoview. For further information please visit [email or web address]. Please follow instruction sheet, as given.  You have been referred to Pulmonary for further evaluation and treatment of COPD.  Follow-Up: At [facility], you and your health needs are our priority.  As part of our continuing mission to provide you with exceptional heart care, we have created designated Provider Care Teams.  These Care Teams include your primary Cardiologist (physician) and Advanced Practice Providers (APPs -  Physician Assistants and Nurse Practitioners) who all work together to provide you with the care you need, when you need it.  We recommend signing up for the patient portal called "MyChart".  Sign up information is provided on this After Visit Summary.  MyChart is used to connect with patients for Virtual Visits (Telemedicine).  Patients are able to view lab/test results, encounter notes, upcoming appointments, etc.  Non-urgent messages can be sent to your provider as well.   To learn more about what you can do with MyChart, go to [email or web address].    Your next appointment:   6 month(s)  Provider:   With any NP/PA or Ruben Furbish, MD        Wilhemina Bonito as a scribe for Ruben Furbish, MD.,have documented all relevant documentation on the  behalf of Ruben Furbish, MD,as directed by  Ruben Furbish, MD while in the presence of Ruben Furbish, MD.  I, Ruben Furbish, MD, have reviewed all documentation for this visit. The documentation on 09/26/22 for the exam, diagnosis, procedures, and orders are all accurate and complete.   Signed, Ruben Furbish, MD  09/26/2022 12:09 PM    [facility]

## 2022-09-28 ENCOUNTER — Telehealth (HOSPITAL_COMMUNITY): Payer: Self-pay

## 2022-09-28 NOTE — Telephone Encounter (Signed)
Spoke with the patient, detailed instructions given. He stated that he would be here for his test. Asked to call back with any questions. S.Laksh Hinners EMTP/CCT

## 2022-10-03 ENCOUNTER — Ambulatory Visit (HOSPITAL_COMMUNITY): Payer: Medicare HMO | Attending: Cardiology

## 2022-10-03 DIAGNOSIS — R0602 Shortness of breath: Secondary | ICD-10-CM | POA: Insufficient documentation

## 2022-10-03 LAB — MYOCARDIAL PERFUSION IMAGING
LV dias vol: 74 mL (ref 62–150)
LV sys vol: 34 mL
Nuc Stress EF: 54 %
Peak HR: 87 {beats}/min
Rest HR: 55 {beats}/min
Rest Nuclear Isotope Dose: 10.7 mCi
SDS: 2
SRS: 0
SSS: 2
ST Depression (mm): 0 mm
Stress Nuclear Isotope Dose: 32.2 mCi
TID: 1.05

## 2022-10-03 MED ORDER — REGADENOSON 0.4 MG/5ML IV SOLN
0.4000 mg | Freq: Once | INTRAVENOUS | Status: AC
  Administered 2022-10-03: 0.4 mg via INTRAVENOUS

## 2022-10-03 MED ORDER — TECHNETIUM TC 99M TETROFOSMIN IV KIT
10.7000 | PACK | Freq: Once | INTRAVENOUS | Status: AC | PRN
  Administered 2022-10-03: 10.7 via INTRAVENOUS

## 2022-10-03 MED ORDER — TECHNETIUM TC 99M TETROFOSMIN IV KIT
32.2000 | PACK | Freq: Once | INTRAVENOUS | Status: AC | PRN
  Administered 2022-10-03: 32.2 via INTRAVENOUS

## 2022-10-11 ENCOUNTER — Ambulatory Visit (INDEPENDENT_AMBULATORY_CARE_PROVIDER_SITE_OTHER): Payer: Medicare HMO | Admitting: Internal Medicine

## 2022-10-11 ENCOUNTER — Encounter: Payer: Self-pay | Admitting: Internal Medicine

## 2022-10-11 VITALS — BP 122/73 | HR 67 | Temp 97.6°F | Ht 69.0 in | Wt 183.7 lb

## 2022-10-11 DIAGNOSIS — D122 Benign neoplasm of ascending colon: Secondary | ICD-10-CM

## 2022-10-11 DIAGNOSIS — Z79899 Other long term (current) drug therapy: Secondary | ICD-10-CM | POA: Diagnosis not present

## 2022-10-11 DIAGNOSIS — I2782 Chronic pulmonary embolism: Secondary | ICD-10-CM | POA: Diagnosis not present

## 2022-10-11 NOTE — Progress Notes (Signed)
Primary Care Physician:  Iona Beard, MD Primary Gastroenterologist:  Dr. Abbey Chatters  Chief Complaint  Patient presents with   Colon Cancer Screening    Patient had to cancel colonoscopy in December due to being put on blood thinner for PE. Started eliquis in December. Currently taking '5mg'$  one bid. Here today to reevaluated.     HPI:   Ruben Young is a 72 y.o. male who presents to clinic today to discuss colonoscopy.  Last colonoscopy 2020 with 6 adenomatous colon polyps removed.  Patient denies any family history of colorectal malignancy.  No melena hematochezia.  No abdominal pain.  No unintentional weight loss.  Will schedule for surveillance colonoscopy December 2023 though suffered bilateral pulmonary embolism.  CT angio chest personally reviewed.  Ultrasound of his lower extremities also showed acute occlusive DVT left popliteal vein.  Now on Eliquis.  Seeing pulmonology next week.  Does note some ongoing dyspnea which may be related to his COPD.  Past Medical History:  Diagnosis Date   AAA (abdominal aortic aneurysm) (HCC)    DDD (degenerative disc disease), lumbar    Hyperlipidemia    Hypertension    MI (myocardial infarction) ([place])    Old myocardial infarction    Tobacco use disorder     Past Surgical History:  Procedure Laterality Date   COLONOSCOPY N/A 10/01/2015   Procedure: COLONOSCOPY;  Surgeon: Danie Binder, MD;  [facility];  Service: Endoscopy;  Laterality: N/A;  245   COLONOSCOPY N/A 04/09/2019   Procedure: COLONOSCOPY;  Surgeon: Danie Binder, MD;  [facility];  Service: Endoscopy;  Laterality: N/A;  12:00   CORONARY ANGIOPLASTY WITH STENT PLACEMENT     EXCISION MASS LOWER EXTREMETIES Left 01/28/2016   Procedure: EXCISION LEFT BUTTOCKS SINUS TRACH;  Surgeon: Alphonsa Overall, MD;  Location: [facility];  Service: General;  Laterality: Left;   LEFT HEART CATHETERIZATION WITH CORONARY ANGIOGRAM N/A 09/24/2012   Procedure: LEFT HEART  CATHETERIZATION WITH CORONARY ANGIOGRAM;  Surgeon: Jettie Booze, MD;  Location: [facility] CATH LAB;  Service: Cardiovascular;  Laterality: N/A;   POLYPECTOMY  04/09/2019   Procedure: POLYPECTOMY;  Surgeon: Danie Binder, MD;  [facility];  Service: Endoscopy;;   TONSILLECTOMY      Current Outpatient Medications  Medication Sig Dispense Refill   albuterol (VENTOLIN HFA) 108 (90 Base) MCG/ACT inhaler Inhale 2 puffs into the lungs every 4 (four) hours as needed for wheezing or shortness of breath. 6.7 g 1   amLODipine (NORVASC) 10 MG tablet TAKE 1 TABLET(10 MG) BY MOUTH DAILY 90 tablet 3   apixaban (ELIQUIS) 5 MG TABS tablet Take 2 tablets (10 mg total) by mouth 2 (two) times daily. X 7 days, then 1 tab ('5mg'$ ) bid starting 07/10/22 (Patient taking differently: Take 5 mg by mouth 2 (two) times daily.) 74 tablet 1   Ascorbic Acid (VITAMIN C PO) Take 1,000 mg by mouth every other day.     aspirin EC 81 MG tablet Take 81 mg by mouth daily.     atorvastatin (LIPITOR) 40 MG tablet TAKE 1 TABLET BY MOUTH DAILY AT 6 PM 90 tablet 2   B Complex Vitamins (B COMPLEX PO) Take 1 capsule by mouth every other day.     CALCIUM PO Take 1 tablet by mouth every other day.     carvedilol (COREG) 6.25 MG tablet TAKE 1 TABLET(6.25 MG) BY MOUTH TWICE DAILY WITH A MEAL 180 tablet 2   Cholecalciferol (  VITAMIN D3) 25 MCG (1000 UT) CAPS Take 1,000 Units by mouth daily.      ferrous sulfate 325 (65 FE) MG EC tablet Take 325 mg by mouth every other day.     GINSENG PO Take 1 capsule by mouth every other day.     hydrochlorothiazide (HYDRODIURIL) 25 MG tablet Take 25 mg by mouth daily.     hydrocortisone cream 1 % Apply 1 Application topically daily as needed for itching.     Multiple Vitamin (MULTIVITAMIN WITH MINERALS) TABS tablet Take 1 tablet by mouth daily.     nitroGLYCERIN (NITROSTAT) 0.4 MG SL tablet Place 1 tablet (0.4 mg total) under the tongue every 5 (five) minutes x 3 doses as needed for chest pain. For  chest pains 25 tablet 0   Omega-3 Fatty Acids (FISH OIL) 1000 MG CAPS Take 1,000 mg by mouth every other day.     POTASSIUM PO Take 1 tablet by mouth every other day.     vitamin E 400 UNIT capsule Take 400 Units by mouth every other day.     No current facility-administered medications for this visit.    Allergies as of 10/11/2022   (No Known Allergies)    Family History  Problem Relation Age of Onset   Cancer Mother    Heart attack Mother    Colon cancer Neg Hx     Social History   Socioeconomic History   Marital status: Single    Spouse name: Not on file   Number of children: Not on file   Years of education: Not on file   Highest education level: Not on file  Occupational History   Not on file  Tobacco Use   Smoking status: Former    Packs/day: 0.25    Years: 48.00    Total pack years: 12.00    Types: Cigarettes    Passive exposure: Past   Smokeless tobacco: Never   Tobacco comments:    1 pk lasts 3-4 days  Vaping Use   Vaping Use: Never used  Substance and Sexual Activity   Alcohol use: No    Alcohol/week: 0.0 standard drinks of alcohol   Drug use: No    Types: Cocaine    Comment: per patient has not used in 4 years   Sexual activity: Not on file  Other Topics Concern   Not on file  Social History Narrative   Not on file   Social Determinants of Health   Financial Resource Strain: Not on file  Food Insecurity: No Food Insecurity (06/30/2022)   Hunger Vital Sign    Worried About Running Out of Food in the Last Year: Never true    Ran Out of Food in the Last Year: Never true  Transportation Needs: No Transportation Needs (06/30/2022)   PRAPARE - Hydrologist (Medical): No    Lack of Transportation (Non-Medical): No  Physical Activity: Not on file  Stress: Not on file  Social Connections: Not on file  Intimate Partner Violence: Not At Risk (06/30/2022)   Humiliation, Afraid, Rape, and Kick questionnaire    Fear of Current  or Ex-Partner: No    Emotionally Abused: No    Physically Abused: No    Sexually Abused: No    Subjective: Review of Systems  Constitutional:  Negative for chills and fever.  HENT:  Negative for congestion and hearing loss.   Eyes:  Negative for blurred vision and double vision.  Respiratory:  Negative  for cough and shortness of breath.   Cardiovascular:  Negative for chest pain and palpitations.  Gastrointestinal:  Negative for abdominal pain, blood in stool, constipation, diarrhea, heartburn, melena and vomiting.  Genitourinary:  Negative for dysuria and urgency.  Musculoskeletal:  Negative for joint pain and myalgias.  Skin:  Negative for itching and rash.  Neurological:  Negative for dizziness and headaches.  Psychiatric/Behavioral:  Negative for depression. The patient is not nervous/anxious.        Objective: BP 122/73   Pulse 67   Temp 97.6 F (36.4 C) (Oral)   Ht '5\' 9"'$  (1.753 m)   Wt 183 lb 11.2 oz (83.3 kg)   BMI 27.13 kg/m  Physical Exam Constitutional:      Appearance: Normal appearance.  HENT:     Head: Normocephalic and atraumatic.  Eyes:     Extraocular Movements: Extraocular movements intact.     Conjunctiva/sclera: Conjunctivae normal.  Cardiovascular:     Rate and Rhythm: Normal rate and regular rhythm.  Pulmonary:     Effort: Pulmonary effort is normal.     Breath sounds: Normal breath sounds.  Abdominal:     General: Bowel sounds are normal.     Palpations: Abdomen is soft.  Musculoskeletal:        General: Normal range of motion.     Cervical back: Normal range of motion and neck supple.  Skin:    General: Skin is warm.  Neurological:     General: No focal deficit present.     Mental Status: He is alert and oriented to person, place, and time.  Psychiatric:        Mood and Affect: Mood normal.        Behavior: Behavior normal.      Assessment: *Adenomatous colon polyps *High risk medication use with Eliquis *Bilateral pulmonary  emboli  Plan: Discussed in depth with patient today.  I think we can hold off on scheduling colonoscopy for now.  Being evaluated by pulmonology next week.  Patient is unsure if he will be on lifelong coagulation or not.  He may benefit from hematology referral for hypercoagulable workup though will defer to his pulmonologist.  Follow-up with me in 3 to 4 months to rediscuss possibility of colonoscopy as he would need to hold his Eliquis x 48 hours.  10/11/2022 11:13 AM   Disclaimer: This note was dictated with voice recognition software. Similar sounding words can inadvertently be transcribed and may not be corrected upon review.

## 2022-10-11 NOTE — Patient Instructions (Signed)
I think the safest thing to do is to hold off on scheduling colonoscopy for now.  We will wait and see what your pulmonologist says next week and go from there.    You may benefit from a hematology referral to workup causes of potential blood clots though I will defer to your pulmonologist in this regard.  Follow-up with me in 3 months.  It was very nice meeting you today.  Dr. Abbey Chatters

## 2022-10-18 ENCOUNTER — Encounter (HOSPITAL_BASED_OUTPATIENT_CLINIC_OR_DEPARTMENT_OTHER): Payer: Self-pay | Admitting: Pulmonary Disease

## 2022-10-18 ENCOUNTER — Ambulatory Visit (INDEPENDENT_AMBULATORY_CARE_PROVIDER_SITE_OTHER): Payer: Medicare HMO | Admitting: Pulmonary Disease

## 2022-10-18 VITALS — BP 132/74 | HR 69 | Ht 69.0 in | Wt 183.0 lb

## 2022-10-18 DIAGNOSIS — I2699 Other pulmonary embolism without acute cor pulmonale: Secondary | ICD-10-CM | POA: Diagnosis not present

## 2022-10-18 DIAGNOSIS — R0602 Shortness of breath: Secondary | ICD-10-CM | POA: Diagnosis not present

## 2022-10-18 DIAGNOSIS — R0609 Other forms of dyspnea: Secondary | ICD-10-CM

## 2022-10-18 MED ORDER — BREZTRI AEROSPHERE 160-9-4.8 MCG/ACT IN AERO: 2.0000 | INHALATION_SPRAY | Freq: Two times a day (BID) | RESPIRATORY_TRACT | 0 refills | Status: DC

## 2022-10-18 NOTE — Progress Notes (Signed)
   Subjective:    Patient ID: Ruben Young, male    DOB: [birth year], 72 y.o.   MRN: [id number]  HPI  Chief Complaint  Patient presents with  . Consult    Pt is here today due to having complaints of SOB that happens when he exerts himself. Also states that he has blood clots in his lungs.      PMH - AAA - 2.5 cm 05/2022   CAD HFrEF  Significant tests/ events reviewed  CTA chest 06/2022  12/2 Echo 50-55%, no WMA, normal RVF   Past Medical History:  Diagnosis Date  . AAA (abdominal aortic aneurysm) ([place])   . DDD (degenerative disc disease), lumbar   . Hyperlipidemia   . Hypertension   . MI (myocardial infarction) ([place])   . Old myocardial infarction   . Tobacco use disorder      Review of Systems     Objective:   Physical Exam  Gen. Pleasant, obese, in no distress, normal affect ENT - no pallor,icterus, no post nasal drip, class 2-3 airway Neck: No JVD, no thyromegaly, no carotid bruits Lungs: no use of accessory muscles, no dullness to percussion, decreased without rales or rhonchi  Cardiovascular: Rhythm regular, heart sounds  normal, no murmurs or gallops, no peripheral edema Abdomen: soft and non-tender, no hepatosplenomegaly, BS normal. Musculoskeletal: No deformities, no cyanosis or clubbing Neuro:  alert, non focal, no tremors       Assessment & Plan:

## 2022-10-18 NOTE — Patient Instructions (Signed)
X schedule pFTs  X amb sat  X Trial of Breztri - 2 puffs twice daily - rinse mouth after use

## 2022-10-19 DIAGNOSIS — R0609 Other forms of dyspnea: Secondary | ICD-10-CM | POA: Insufficient documentation

## 2022-10-19 DIAGNOSIS — J439 Emphysema, unspecified: Secondary | ICD-10-CM | POA: Insufficient documentation

## 2022-10-19 NOTE — Assessment & Plan Note (Signed)
He is 3 months out from his pulmonary embolism episode.  He reports compliance with Eliquis.  No RV strain was noted on CT or echo. He should have recovered from this episode by now.  His residual shortness of breath may be related to underlying lung disease. Will assess with PFTs.  He does not desaturate significantly with exertion which is reassuring I will start him on Breztri while we are awaiting PFTs a sample was provided and he will call back for prescription if this works. He will continue to use albuterol on an as-needed basis. LV function appears okay and he does not provide overt symptoms of heart failure to explain his dyspnea

## 2022-10-19 NOTE — Assessment & Plan Note (Signed)
No clear etiology for his VTE, no significant risk factors. He would need anticoagulation for an extended Period of time possibly lifelong

## 2022-10-31 DIAGNOSIS — I10 Essential (primary) hypertension: Secondary | ICD-10-CM | POA: Diagnosis not present

## 2022-10-31 DIAGNOSIS — I2699 Other pulmonary embolism without acute cor pulmonale: Secondary | ICD-10-CM | POA: Diagnosis not present

## 2022-10-31 DIAGNOSIS — J449 Chronic obstructive pulmonary disease, unspecified: Secondary | ICD-10-CM | POA: Diagnosis not present

## 2022-10-31 DIAGNOSIS — R0602 Shortness of breath: Secondary | ICD-10-CM | POA: Diagnosis not present

## 2022-10-31 DIAGNOSIS — I5022 Chronic systolic (congestive) heart failure: Secondary | ICD-10-CM | POA: Diagnosis not present

## 2022-10-31 DIAGNOSIS — I2510 Atherosclerotic heart disease of native coronary artery without angina pectoris: Secondary | ICD-10-CM | POA: Diagnosis not present

## 2022-10-31 DIAGNOSIS — R0609 Other forms of dyspnea: Secondary | ICD-10-CM | POA: Diagnosis not present

## 2022-11-09 ENCOUNTER — Other Ambulatory Visit: Payer: Self-pay | Admitting: Family Medicine

## 2022-11-09 DIAGNOSIS — R140 Abdominal distension (gaseous): Secondary | ICD-10-CM

## 2022-12-05 ENCOUNTER — Ambulatory Visit (INDEPENDENT_AMBULATORY_CARE_PROVIDER_SITE_OTHER): Payer: Medicare HMO | Admitting: Pulmonary Disease

## 2022-12-05 ENCOUNTER — Encounter (HOSPITAL_BASED_OUTPATIENT_CLINIC_OR_DEPARTMENT_OTHER): Payer: Self-pay | Admitting: Pulmonary Disease

## 2022-12-05 VITALS — BP 122/66 | HR 71 | Temp 97.8°F | Ht 69.0 in | Wt 183.0 lb

## 2022-12-05 DIAGNOSIS — I2699 Other pulmonary embolism without acute cor pulmonale: Secondary | ICD-10-CM

## 2022-12-05 DIAGNOSIS — J432 Centrilobular emphysema: Secondary | ICD-10-CM | POA: Diagnosis not present

## 2022-12-05 DIAGNOSIS — Z720 Tobacco use: Secondary | ICD-10-CM

## 2022-12-05 DIAGNOSIS — R0602 Shortness of breath: Secondary | ICD-10-CM | POA: Diagnosis not present

## 2022-12-05 LAB — PULMONARY FUNCTION TEST
DL/VA % pred: 71 %
DL/VA: 2.88 ml/min/mmHg/L
DLCO cor % pred: 54 %
DLCO cor: 13.39 ml/min/mmHg
DLCO unc % pred: 54 %
DLCO unc: 13.39 ml/min/mmHg
FEF 25-75 Post: 1.23 L/sec
FEF 25-75 Pre: 0.91 L/sec
FEF2575-%Change-Post: 35 %
FEF2575-%Pred-Post: 55 %
FEF2575-%Pred-Pre: 40 %
FEV1-%Change-Post: 9 %
FEV1-%Pred-Post: 58 %
FEV1-%Pred-Pre: 53 %
FEV1-Post: 1.76 L
FEV1-Pre: 1.6 L
FEV1FVC-%Change-Post: 2 %
FEV1FVC-%Pred-Pre: 87 %
FEV6-%Change-Post: 6 %
FEV6-%Pred-Post: 68 %
FEV6-%Pred-Pre: 64 %
FEV6-Post: 2.67 L
FEV6-Pre: 2.49 L
FEV6FVC-%Change-Post: 0 %
FEV6FVC-%Pred-Post: 104 %
FEV6FVC-%Pred-Pre: 105 %
FVC-%Change-Post: 7 %
FVC-%Pred-Post: 65 %
FVC-%Pred-Pre: 60 %
FVC-Post: 2.71 L
FVC-Pre: 2.52 L
Post FEV1/FVC ratio: 65 %
Post FEV6/FVC ratio: 98 %
Pre FEV1/FVC ratio: 64 %
Pre FEV6/FVC Ratio: 99 %
RV % pred: 118 %
RV: 2.91 L
TLC % pred: 84 %
TLC: 5.75 L

## 2022-12-05 MED ORDER — ALBUTEROL SULFATE HFA 108 (90 BASE) MCG/ACT IN AERS: 2.0000 | INHALATION_SPRAY | RESPIRATORY_TRACT | 1 refills | Status: DC | PRN

## 2022-12-05 MED ORDER — APIXABAN 5 MG PO TABS: 5.0000 mg | ORAL_TABLET | Freq: Two times a day (BID) | ORAL | 0 refills | Status: DC

## 2022-12-05 NOTE — Progress Notes (Signed)
Full PFT Performed Today  

## 2022-12-05 NOTE — Patient Instructions (Signed)
Full PFT Performed Today  

## 2022-12-05 NOTE — Assessment & Plan Note (Signed)
Moderate airway obstruction and decreased DLCO on PFTs.  Unfortunately Ruben Young has not helped.  Will provide him with a sample of Trelegy if available to trial.  We will also provide him refills on albuterol. We discussed COPD action plan and signs and symptoms of COPD exacerbation. He may benefit from a pulmonary rehab program since he has persistent shortness of breath

## 2022-12-05 NOTE — Assessment & Plan Note (Signed)
I congratulated him on smoking cessation.  He can be enrolled in lung cancer screening program in the future

## 2022-12-05 NOTE — Assessment & Plan Note (Signed)
Continue apixaban, refills provided.  Since this was unprovoked, may need long-term anticoagulation, possibly lifelong

## 2022-12-05 NOTE — Patient Instructions (Signed)
x refills on apixaban and albuterol  X okay to take Mucinex 600 mg twice daily  x sample of Trelegy 100 -take once daily, instead of Breztri -call us for prescription of this works

## 2022-12-05 NOTE — Progress Notes (Signed)
   Subjective:    Patient ID: Ruben Young, male    DOB: 1951-07-21, 72 y.o.   MRN: [id number]  HPI  72 year old ex-smoker for follow-up of COPD and recent PE  PMH - AAA - 2.5 cm 05/2022 CAD HFrEF -EF 50 to 55% 06/2022 Hosp 06/2022 for PE,LLE DVT  He smoked half to 1 pack/day until he quit 05/2022, about 50 pack years    Initial OV 09/2022 >> started on breztri for persistent shortness of breath.  Desaturated to 93% on walking  32-month follow-up visit. He complains of persistent shortness of breath he can walk about 30 yards before he is out of breath.  He is able to walk inside a store.  He also complains of occasional green sputum.  Mucinex seems to help 9 Breztri did not help as much he does have prescriptions. He is compliant with apixaban no bleeding issues  We reviewed PFTs today  Significant tests/ events reviewed   PFTs 11/2022 moderate airway obstruction ratio 64, post BD FEV1 58%, no BD response, DLCO 13.4/54% normal TLC  CTA chest 06/2022 bilateral upper and lower lobe segmental and subsegmental pulmonary emboli, no RV strain Venous duplex left popliteal DVT   12/2 Echo 50-55%, no WMA, normal RVF  NPSG 01/2018 neg, desat to 89%     Review of Systems neg for any significant sore throat, dysphagia, itching, sneezing, nasal congestion or excess/ purulent secretions, fever, chills, sweats, unintended wt loss, pleuritic or exertional cp, hempoptysis, orthopnea pnd or change in chronic leg swelling. Also denies presyncope, palpitations, heartburn, abdominal pain, nausea, vomiting, diarrhea or change in bowel or urinary habits, dysuria,hematuria, rash, arthralgias, visual complaints, headache, numbness weakness or ataxia.     Objective:   Physical Exam  Gen. Pleasant, well-nourished, in no distress ENT - no thrush, no pallor/icterus,no post nasal drip Neck: No JVD, no thyromegaly, no carotid bruits Lungs: no use of accessory muscles, no dullness to percussion,  decreased bilateral without rales or rhonchi  Cardiovascular: Rhythm regular, heart sounds  normal, no murmurs or gallops, no peripheral edema Musculoskeletal: No deformities, no cyanosis or clubbing        Assessment & Plan:

## 2022-12-26 DIAGNOSIS — I2510 Atherosclerotic heart disease of native coronary artery without angina pectoris: Secondary | ICD-10-CM | POA: Diagnosis not present

## 2022-12-26 DIAGNOSIS — I5022 Chronic systolic (congestive) heart failure: Secondary | ICD-10-CM | POA: Diagnosis not present

## 2022-12-26 DIAGNOSIS — J432 Centrilobular emphysema: Secondary | ICD-10-CM | POA: Diagnosis not present

## 2022-12-26 DIAGNOSIS — I2699 Other pulmonary embolism without acute cor pulmonale: Secondary | ICD-10-CM | POA: Diagnosis not present

## 2022-12-26 DIAGNOSIS — I10 Essential (primary) hypertension: Secondary | ICD-10-CM | POA: Diagnosis not present

## 2022-12-26 DIAGNOSIS — R0609 Other forms of dyspnea: Secondary | ICD-10-CM | POA: Diagnosis not present

## 2022-12-26 DIAGNOSIS — I722 Aneurysm of renal artery: Secondary | ICD-10-CM | POA: Diagnosis not present

## 2023-01-12 ENCOUNTER — Telehealth (HOSPITAL_COMMUNITY): Payer: Self-pay | Admitting: *Deleted

## 2023-01-12 NOTE — Telephone Encounter (Signed)
Cardiac Rehab Medication Review by a Nurse   Does the patient  feel that his/her medications are working for him/her?  yes   Has the patient been experiencing any side effects to the medications prescribed?  no   Does the patient measure his/her own blood pressure or blood glucose at home?  yes    Does the patient have any problems obtaining medications due to transportation or finances?   no   Understanding of regimen: good Understanding of indications: good Potential of compliance: good     Verified medication via phone with patient. Nurse comments:     

## 2023-01-15 ENCOUNTER — Encounter (HOSPITAL_COMMUNITY)
Admission: RE | Admit: 2023-01-15 | Discharge: 2023-01-15 | Disposition: A | Discharge status: Home / Self Care | Payer: Medicare HMO | Source: Ambulatory Visit | Attending: Pulmonary Disease | Admitting: Pulmonary Disease

## 2023-01-15 ENCOUNTER — Encounter (HOSPITAL_COMMUNITY): Payer: Self-pay

## 2023-01-15 VITALS — BP 112/50 | HR 73 | Ht 69.0 in | Wt 179.5 lb

## 2023-01-15 DIAGNOSIS — J432 Centrilobular emphysema: Secondary | ICD-10-CM | POA: Insufficient documentation

## 2023-01-15 NOTE — Progress Notes (Addendum)
Pulmonary Individual Treatment Plan  Patient Details  Name: Ruben Young MRN: [id number] Date of Birth: [birth year] Referring Provider:   Flowsheet Row PULMONARY REHAB OTHER RESP ORIENTATION from 01/15/2023 in [facility] CARDIAC REHABILITATION  Referring Provider Dr. Vassie Loll       Initial Encounter Date:  Flowsheet Row PULMONARY REHAB OTHER RESP ORIENTATION from 01/15/2023 in [facility] CARDIAC REHABILITATION  Date 01/15/23       Visit Diagnosis: Centrilobular emphysema (HCC)  Patient's Home Medications on Admission:   Current Outpatient Medications:    albuterol (VENTOLIN HFA) 108 (90 Base) MCG/ACT inhaler, Inhale 2 puffs into the lungs every 4 (four) hours as needed for wheezing or shortness of breath., Disp: 6.7 g, Rfl: 1   amLODipine (NORVASC) 10 MG tablet, TAKE 1 TABLET(10 MG) BY MOUTH DAILY, Disp: 90 tablet, Rfl: 3   apixaban (ELIQUIS) 5 MG TABS tablet, , Disp: , Rfl:    Ascorbic Acid (VITAMIN C PO), Take 1,000 mg by mouth every other day., Disp: , Rfl:    aspirin EC 81 MG tablet, Take 81 mg by mouth daily., Disp: , Rfl:    atorvastatin (LIPITOR) 40 MG tablet, TAKE 1 TABLET BY MOUTH DAILY AT 6 PM, Disp: 90 tablet, Rfl: 2   B Complex Vitamins (B COMPLEX PO), Take 1 capsule by mouth every other day., Disp: , Rfl:    CALCIUM PO, Take 1 tablet by mouth every other day., Disp: , Rfl:    carvedilol (COREG) 6.25 MG tablet, TAKE 1 TABLET(6.25 MG) BY MOUTH TWICE DAILY WITH A MEAL, Disp: 180 tablet, Rfl: 2   Cholecalciferol (VITAMIN D3) 25 MCG (1000 UT) CAPS, Take 1,000 Units by mouth daily. , Disp: , Rfl:    ferrous sulfate 325 (65 FE) MG EC tablet, Take 325 mg by mouth every other day., Disp: , Rfl:    GINSENG PO, Take 1 capsule by mouth every other day., Disp: , Rfl:    hydrochlorothiazide (HYDRODIURIL) 25 MG tablet, Take 25 mg by mouth daily., Disp: , Rfl:    hydrocortisone cream 1 %, Apply 1 Application topically daily as needed for itching., Disp: , Rfl:    Multiple Vitamin  (MULTIVITAMIN WITH MINERALS) TABS tablet, Take 1 tablet by mouth daily., Disp: , Rfl:    nitroGLYCERIN (NITROSTAT) 0.4 MG SL tablet, Place 1 tablet (0.4 mg total) under the tongue every 5 (five) minutes x 3 doses as needed for chest pain. For chest pains, Disp: 25 tablet, Rfl: 0   Omega-3 Fatty Acids (FISH OIL) 1000 MG CAPS, Take 1,000 mg by mouth every other day., Disp: , Rfl:    POTASSIUM PO, Take 1 tablet by mouth every other day., Disp: , Rfl:    vitamin E 400 UNIT capsule, Take 400 Units by mouth every other day., Disp: , Rfl:   Past Medical History: Past Medical History:  Diagnosis Date   AAA (abdominal aortic aneurysm) (HCC)    DDD (degenerative disc disease), lumbar    Hyperlipidemia    Hypertension    MI (myocardial infarction) (HCC)    Old myocardial infarction    Tobacco use disorder     Tobacco Use: Social History   Tobacco Use  Smoking Status Former   Packs/day: 1.00   Years: 57.00   Additional pack years: 0.00   Total pack years: 57.00   Types: Cigarettes   Start date: 15   Quit date: 05/2022   Years since quitting: 0.6   Passive exposure: Past  Smokeless Tobacco Never  Labs: Review Flowsheet  More data exists      Latest Ref Rng & Units 12/15/2011 09/24/2012 09/25/2012 04/14/2014 06/30/2022  Labs for ITP Cardiac and Pulmonary Rehab  Cholestrol 0 - 200 mg/dL - - 161  096  -  LDL (calc) 0 - 99 mg/dL - - 81  63  -  HDL-C >04.54 mg/dL - - 34  09.81  -  Trlycerides 0.0 - 149.0 mg/dL - - 91  19.1  -  PH, Arterial 7.35 - 7.45 - - - - 7.5   PCO2 arterial 32 - 48 mmHg - - - - 38   Bicarbonate 20.0 - 28.0 mmol/L - - - - 29.7   TCO2 0 - 100 mmol/L 28  24  - - -  O2 Saturation % - - - - 84.7     Capillary Blood Glucose: No results found for: "GLUCAP"   Pulmonary Assessment Scores:  Pulmonary Assessment Scores     Row Name 01/15/23 1316         ADL UCSD   ADL Phase Entry     SOB Score total 40     Rest 1     Walk 1     Stairs 3     Bath 2      Dress 2     Shop 3       CAT Score   CAT Score 21       mMRC Score   mMRC Score 3             UCSD: Self-administered rating of dyspnea associated with activities of daily living (ADLs) 6-point scale (0 = "not at all" to 5 = "maximal or unable to do because of breathlessness")  Scoring Scores range from 0 to 120.  Minimally important difference is 5 units  CAT: CAT can identify the health impairment of COPD patients and is better correlated with disease progression.  CAT has a scoring range of zero to 40. The CAT score is classified into four groups of low (less than 10), medium (10 - 20), high (21-30) and very high (31-40) based on the impact level of disease on health status. A CAT score over 10 suggests significant symptoms.  A worsening CAT score could be explained by an exacerbation, poor medication adherence, poor inhaler technique, or progression of COPD or comorbid conditions.  CAT MCID is 2 points  mMRC: mMRC (Modified Medical Research Council) Dyspnea Scale is used to assess the degree of baseline functional disability in patients of respiratory disease due to dyspnea. No minimal important difference is established. A decrease in score of 1 point or greater is considered a positive change.   Pulmonary Function Assessment:   Exercise Target Goals: Exercise Program Goal: Individual exercise prescription set using results from initial 6 min walk test and THRR while considering  patient's activity barriers and safety.   Exercise Prescription Goal: Initial exercise prescription builds to 30-45 minutes a day of aerobic activity, 2-3 days per week.  Home exercise guidelines will be given to patient during program as part of exercise prescription that the participant will acknowledge.  Activity Barriers & Risk Stratification:  Activity Barriers & Cardiac Risk Stratification - 01/15/23 1243       Activity Barriers & Cardiac Risk Stratification   Activity Barriers  Arthritis;Back Problems;Deconditioning;Shortness of Breath    Cardiac Risk Stratification High             6 Minute Walk:  6 Minute Walk  Row Name 01/15/23 1337         6 Minute Walk   Phase Initial     Distance 1200 feet     Walk Time 6 minutes     # of Rest Breaks 0     MPH 2.27     METS 2.61     RPE 12     Perceived Dyspnea  13     VO2 Peak 9.15     Symptoms No     Resting HR 73 bpm     Resting BP 112/50     Resting Oxygen Saturation  96 %     Exercise Oxygen Saturation  during 6 min walk 90 %     Max Ex. HR 92 bpm     Max Ex. BP 136/60     2 Minute Post BP 118/60       Interval HR   1 Minute HR 80     2 Minute HR 81     3 Minute HR 87     4 Minute HR 92     5 Minute HR 90     6 Minute HR 92     2 Minute Post HR 76     Interval Heart Rate? Yes       Interval Oxygen   Interval Oxygen? Yes     Baseline Oxygen Saturation % 96 %     1 Minute Oxygen Saturation % 92 %     1 Minute Liters of Oxygen 0 L     2 Minute Oxygen Saturation % 92 %     2 Minute Liters of Oxygen 0 L     3 Minute Oxygen Saturation % 90 %     3 Minute Liters of Oxygen 0 L     4 Minute Oxygen Saturation % 91 %     4 Minute Liters of Oxygen 0 L     5 Minute Oxygen Saturation % 92 %     5 Minute Liters of Oxygen 0 L     6 Minute Oxygen Saturation % 93 %     6 Minute Liters of Oxygen 0 L     2 Minute Post Oxygen Saturation % 96 %     2 Minute Post Liters of Oxygen 0 L              Oxygen Initial Assessment:  Oxygen Initial Assessment - 01/15/23 1316       Initial 6 min Walk   Oxygen Used None             Oxygen Re-Evaluation:  Oxygen Re-Evaluation     Row Name 01/15/23 1316             Program Oxygen Prescription   Program Oxygen Prescription --         Home Oxygen   Home Oxygen Device --       Sleep Oxygen Prescription --       Home Exercise Oxygen Prescription --       Home Resting Oxygen Prescription --         Goals/Expected Outcomes   Short Term  Goals --       Long  Term Goals --                Oxygen Discharge (Final Oxygen Re-Evaluation):  Oxygen Re-Evaluation - 01/15/23 1316       Program Oxygen Prescription   Program Oxygen Prescription --  Home Oxygen   Home Oxygen Device --    Sleep Oxygen Prescription --    Home Exercise Oxygen Prescription --    Home Resting Oxygen Prescription --      Goals/Expected Outcomes   Short Term Goals --    Long  Term Goals --             Initial Exercise Prescription:  Initial Exercise Prescription - 01/15/23 1300       Date of Initial Exercise RX and Referring Provider   Date 01/15/23    Referring Provider Dr. Vassie Loll    Expected Discharge Date 05/22/23      Treadmill   MPH 2    Grade 0    Minutes 17      NuStep   Level 1    SPM 60    Minutes 22      Prescription Details   Frequency (times per week) 2    Duration Progress to 30 minutes of continuous aerobic without signs/symptoms of physical distress      Intensity   THRR 40-80% of Max Heartrate 59.118    Ratings of Perceived Exertion 11-13    Perceived Dyspnea 0-4      Resistance Training   Training Prescription Yes    Weight 3    Reps 10-15             Perform Capillary Blood Glucose checks as needed.  Exercise Prescription Changes:   Exercise Comments:   Exercise Goals and Review:   Exercise Goals     Row Name 01/15/23 1340             Exercise Goals   Increase Physical Activity Yes       Intervention Provide advice, education, support and counseling about physical activity/exercise needs.;Develop an individualized exercise prescription for aerobic and resistive training based on initial evaluation findings, risk stratification, comorbidities and participant's personal goals.       Expected Outcomes Short Term: Attend rehab on a regular basis to increase amount of physical activity.;Long Term: Add in home exercise to make exercise part of routine and to increase amount of  physical activity.;Long Term: Exercising regularly at least 3-5 days a week.       Increase Strength and Stamina Yes       Intervention Provide advice, education, support and counseling about physical activity/exercise needs.;Develop an individualized exercise prescription for aerobic and resistive training based on initial evaluation findings, risk stratification, comorbidities and participant's personal goals.       Expected Outcomes Short Term: Increase workloads from initial exercise prescription for resistance, speed, and METs.;Long Term: Improve cardiorespiratory fitness, muscular endurance and strength as measured by increased METs and functional capacity ( );Short Term: Perform resistance training exercises routinely during rehab and add in resistance training at home       Able to understand and use rate of perceived exertion (RPE) scale Yes       Intervention Provide education and explanation on how to use RPE scale       Expected Outcomes Short Term: Able to use RPE daily in rehab to express subjective intensity level;Long Term:  Able to use RPE to guide intensity level when exercising independently       Able to understand and use Dyspnea scale Yes       Intervention Provide education and explanation on how to use Dyspnea scale       Expected Outcomes Short Term: Able to use Dyspnea scale daily in rehab  to express subjective sense of shortness of breath during exertion;Long Term: Able to use Dyspnea scale to guide intensity level when exercising independently       Knowledge and understanding of Target Heart Rate Range (THRR) Yes       Intervention Provide education and explanation of THRR including how the numbers were predicted and where they are located for reference       Expected Outcomes Short Term: Able to state/look up THRR;Long Term: Able to use THRR to govern intensity when exercising independently;Short Term: Able to use daily as guideline for intensity in rehab        Understanding of Exercise Prescription Yes       Intervention Provide education, explanation, and written materials on patient's individual exercise prescription       Expected Outcomes Short Term: Able to explain program exercise prescription;Long Term: Able to explain home exercise prescription to exercise independently                Exercise Goals Re-Evaluation :   Discharge Exercise Prescription (Final Exercise Prescription Changes):   Nutrition:  Target Goals: Understanding of nutrition guidelines, daily intake of sodium 1500mg , cholesterol 200mg , calories 30% from fat and 7% or less from saturated fats, daily to have 5 or more servings of fruits and vegetables.  Biometrics:  Pre Biometrics - 01/15/23 1340       Pre Biometrics   Height 5\' 9"  (1.753 m)    Weight 81.4 kg    Waist Circumference 42 inches    Hip Circumference 39 inches    Waist to Hip Ratio 1.08 %    BMI (Calculated) 26.49    Triceps Skinfold 15 mm    % Body Fat 28.2 %    Grip Strength 27.7 kg    Flexibility 0 in    Single Leg Stand 0 seconds              Nutrition Therapy Plan and Nutrition Goals:  Nutrition Therapy & Goals - 01/15/23 1318       Nutrition Therapy   RD appointment deferred Yes      Personal Nutrition Goals   Comments Patient's diet assessment score was 109. He says he cooks his own meals. Handout provided and explained regarding healthier choices and referral to RD offered. Patient declined. He says he is happy with his diet. We will continue to monitor.      Intervention Plan   Intervention Nutrition handout(s) given to patient.    Expected Outcomes Short Term Goal: Understand basic principles of dietary content, such as calories, fat, sodium, cholesterol and nutrients.             Nutrition Assessments:  Nutrition Assessments - 01/15/23 1318       MEDFICTS Scores   Pre Score 109            MEDIFICTS Score Key: ?70 Need to make dietary changes  40-70  Heart Healthy Diet ? 40 Therapeutic Level Cholesterol Diet   Picture Your Plate Scores: <98 Unhealthy dietary pattern with much room for improvement. 41-50 Dietary pattern unlikely to meet recommendations for good health and room for improvement. 51-60 More healthful dietary pattern, with some room for improvement.  >60 Healthy dietary pattern, although there may be some specific behaviors that could be improved.    Nutrition Goals Re-Evaluation:   Nutrition Goals Discharge (Final Nutrition Goals Re-Evaluation):   Psychosocial: Target Goals: Acknowledge presence or absence of significant depression and/or stress, maximize coping skills,  provide positive support system. Participant is able to verbalize types and ability to use techniques and skills needed for reducing stress and depression.  Initial Review & Psychosocial Screening:  Initial Psych Review & Screening - 01/15/23 1322       Initial Review   Current issues with Current Sleep Concerns      Family Dynamics   Good Support System? Yes      Barriers   Psychosocial barriers to participate in program There are no identifiable barriers or psychosocial needs.;The patient should benefit from training in stress management and relaxation.      Screening Interventions   Interventions To provide support and resources with identified psychosocial needs;Provide feedback about the scores to participant;Encouraged to exercise    Expected Outcomes Short Term goal: Identification and review with participant of any Quality of Life or Depression concerns found by scoring the questionnaire.;Short Term goal: Utilizing psychosocial counselor, staff and physician to assist with identification of specific Stressors or current issues interfering with healing process. Setting desired goal for each stressor or current issue identified.             Quality of Life Scores:  Quality of Life - 01/15/23 1341       Quality of Life   Select  Quality of Life      Quality of Life Scores   Health/Function Pre 18.54 %    Socioeconomic Pre 20.67 %    Psych/Spiritual Pre 21.07 %    Family Pre 21 %    GLOBAL Pre 19.71 %            Scores of 19 and below usually indicate a poorer quality of life in these areas.  A difference of  2-3 points is a clinically meaningful difference.  A difference of 2-3 points in the total score of the Quality of Life Index has been associated with significant improvement in overall quality of life, self-image, physical symptoms, and general health in studies assessing change in quality of life.   PHQ-9: Review Flowsheet       01/15/2023  Depression screen PHQ 2/9  Decreased Interest 3  Down, Depressed, Hopeless 0  PHQ - 2 Score 3  Altered sleeping 1  Tired, decreased energy 3  Change in appetite 0  Feeling bad or failure about yourself  0  Trouble concentrating 0  Moving slowly or fidgety/restless 0  Suicidal thoughts 0  PHQ-9 Score 7  Difficult doing work/chores Not difficult at all   Interpretation of Total Score  Total Score Depression Severity:  1-4 = Minimal depression, 5-9 = Mild depression, 10-14 = Moderate depression, 15-19 = Moderately severe depression, 20-27 = Severe depression   Psychosocial Evaluation and Intervention:  Psychosocial Evaluation - 01/15/23 1323       Psychosocial Evaluation & Interventions   Interventions Stress management education;Relaxation education;Encouraged to exercise with the program and follow exercise prescription    Comments Patient has no psychosocial barriers identified at his orientation visit. His initial PHQ-9 score was 7 due to him not having any energy to do anything and his SOB. He denies any depression or anxiety. He does say he has trouble staying asleep sometimes but does not take anything. He says this is not a problem. He says he has support from his brother-in-law and some friends. He does have 3 children but says he does not talk to  them often. He lives alone and uses [employer] for his transportation. He says he plans to set this up  himself. He is ready to start the program hoping to gain some energy and have less SOB.    Expected Outcomes Patient will continue to have no psychosocial barriers identified.    Continue Psychosocial Services  No Follow up required             Psychosocial Re-Evaluation:   Psychosocial Discharge (Final Psychosocial Re-Evaluation):    Education: Education Goals: Education classes will be provided on a weekly basis, covering required topics. Participant will state understanding/return demonstration of topics presented.  Learning Barriers/Preferences:  Learning Barriers/Preferences - 01/15/23 1319       Learning Barriers/Preferences   Learning Barriers None    Learning Preferences Audio             Education Topics: How Lungs Work and Diseases: - Discuss the anatomy of the lungs and diseases that can affect the lungs, such as COPD.   Exercise: -Discuss the importance of exercise, FITT principles of exercise, normal and abnormal responses to exercise, and how to exercise safely.   Environmental Irritants: -Discuss types of environmental irritants and how to limit exposure to environmental irritants.   Meds/Inhalers and oxygen: - Discuss respiratory medications, definition of an inhaler and oxygen, and the proper way to use an inhaler and oxygen.   Energy Saving Techniques: - Discuss methods to conserve energy and decrease shortness of breath when performing activities of daily living.    Bronchial Hygiene / Breathing Techniques: - Discuss breathing mechanics, pursed-lip breathing technique,  proper posture, effective ways to clear airways, and other functional breathing techniques   Cleaning Equipment: - Provides group verbal and written instruction about the health risks of elevated stress, cause of high stress, and healthy ways to reduce stress.   Nutrition  I: Fats: - Discuss the types of cholesterol, what cholesterol does to the body, and how cholesterol levels can be controlled.   Nutrition II: Labels: -Discuss the different components of food labels and how to read food labels.   Respiratory Infections: - Discuss the signs and symptoms of respiratory infections, ways to prevent respiratory infections, and the importance of seeking medical treatment when having a respiratory infection.   Stress I: Signs and Symptoms: - Discuss the causes of stress, how stress may lead to anxiety and depression, and ways to limit stress.   Stress II: Relaxation: -Discuss relaxation techniques to limit stress.   Oxygen for Home/Travel: - Discuss how to prepare for travel when on oxygen and proper ways to transport and store oxygen to ensure safety.   Knowledge Questionnaire Score:  Knowledge Questionnaire Score - 01/15/23 1319       Knowledge Questionnaire Score   Pre Score 9/18             Core Components/Risk Factors/Patient Goals at Admission:  Personal Goals and Risk Factors at Admission - 01/15/23 1320       Core Components/Risk Factors/Patient Goals on Admission    Weight Management Obesity;Weight Maintenance    Improve shortness of breath with ADL's Yes    Intervention Provide education, individualized exercise plan and daily activity instruction to help decrease symptoms of SOB with activities of daily living.    Expected Outcomes Short Term: Improve cardiorespiratory fitness to achieve a reduction of symptoms when performing ADLs;Long Term: Be able to perform more ADLs without symptoms or delay the onset of symptoms    Heart Failure Yes    Intervention Provide a combined exercise and nutrition program that is supplemented with education, support and counseling about  heart failure. Directed toward relieving symptoms such as shortness of breath, decreased exercise tolerance, and extremity edema.    Expected Outcomes Improve  functional capacity of life    Hypertension Yes    Intervention Provide education on lifestyle modifcations including regular physical activity/exercise, weight management, moderate sodium restriction and increased consumption of fresh fruit, vegetables, and low fat dairy, alcohol moderation, and smoking cessation.;Monitor prescription use compliance.    Expected Outcomes Short Term: Continued assessment and intervention until BP is < 140/30mm HG in hypertensive participants. < 130/34mm HG in hypertensive participants with diabetes, heart failure or chronic kidney disease.;Long Term: Maintenance of blood pressure at goal levels.    Personal Goal Other Yes    Personal Goal Patient wants to breathe better and have more energy to complete his ADL's.    Intervention Patient will attend PR 2 days/week with exercise and education.    Expected Outcomes Patient will complete the program meeting both personal and program goals.             Core Components/Risk Factors/Patient Goals Review:    Core Components/Risk Factors/Patient Goals at Discharge (Final Review):    ITP Comments:   Comments: Patient arrived for 1st visit/orientation/education at 1230. Patient was referred to PR by Dr. Cyril Mourning due to Centrilobular Emphysema (J43.2). During orientation advised patient on arrival and appointment times what to wear, what to do before, during and after exercise. Reviewed attendance and class policy.  Pt is scheduled to return Pulmonary Rehab on 01/22/23 at 1530. Pt was advised to come to class 15 minutes before class starts.  Discussed RPE/Dpysnea scales. Patient participated in warm up stretches. Patient was able to complete 6 minute walk test. Patient was measured for the equipment. Discussed equipment safety with patient. Took patient pre-anthropometric measurements. Patient finished visit at 1330.

## 2023-01-17 ENCOUNTER — Ambulatory Visit: Payer: Medicare HMO | Admitting: Gastroenterology

## 2023-01-17 NOTE — Progress Notes (Deleted)
GI Office Note    Referring Provider: Mirna Mires, MD Primary Care Physician:  Mirna Mires, MD  Primary Gastroenterologist: Hennie Duos. Marletta Lor, DO   Chief Complaint   No chief complaint on file.   History of Present Illness   Ruben Young is a 72 y.o. male presenting today to discuss possibility of colonoscopy. Last seen 09/2022. History of adenomatous colon polyps. Due for surveillance colonoscopy at that time but held off due to recent diagnosis (06/2022) of bilateral PE, LE DVT on Eliquis.    Followed by pulmonology. Last seen in 11/2022. History of COPD as well. Reported SOB after waling 30 years. In pulmonary rehab program. He quit smoking.     Medications   Current Outpatient Medications  Medication Sig Dispense Refill   albuterol (VENTOLIN HFA) 108 (90 Base) MCG/ACT inhaler Inhale 2 puffs into the lungs every 4 (four) hours as needed for wheezing or shortness of breath. 6.7 g 1   amLODipine (NORVASC) 10 MG tablet TAKE 1 TABLET(10 MG) BY MOUTH DAILY 90 tablet 3   apixaban (ELIQUIS) 5 MG TABS tablet      Ascorbic Acid (VITAMIN C PO) Take 1,000 mg by mouth every other day.     aspirin EC 81 MG tablet Take 81 mg by mouth daily.     atorvastatin (LIPITOR) 40 MG tablet TAKE 1 TABLET BY MOUTH DAILY AT 6 PM 90 tablet 2   B Complex Vitamins (B COMPLEX PO) Take 1 capsule by mouth every other day.     CALCIUM PO Take 1 tablet by mouth every other day.     carvedilol (COREG) 6.25 MG tablet TAKE 1 TABLET(6.25 MG) BY MOUTH TWICE DAILY WITH A MEAL 180 tablet 2   Cholecalciferol (VITAMIN D3) 25 MCG (1000 UT) CAPS Take 1,000 Units by mouth daily.      ferrous sulfate 325 (65 FE) MG EC tablet Take 325 mg by mouth every other day.     GINSENG PO Take 1 capsule by mouth every other day.     hydrochlorothiazide (HYDRODIURIL) 25 MG tablet Take 25 mg by mouth daily.     hydrocortisone cream 1 % Apply 1 Application topically daily as needed for itching.     Multiple Vitamin  (MULTIVITAMIN WITH MINERALS) TABS tablet Take 1 tablet by mouth daily.     nitroGLYCERIN (NITROSTAT) 0.4 MG SL tablet Place 1 tablet (0.4 mg total) under the tongue every 5 (five) minutes x 3 doses as needed for chest pain. For chest pains 25 tablet 0   Omega-3 Fatty Acids (FISH OIL) 1000 MG CAPS Take 1,000 mg by mouth every other day.     POTASSIUM PO Take 1 tablet by mouth every other day.     vitamin E 400 UNIT capsule Take 400 Units by mouth every other day.     No current facility-administered medications for this visit.    Allergies   Allergies as of 01/17/2023   (No Known Allergies)     Past Medical History   Past Medical History:  Diagnosis Date   AAA (abdominal aortic aneurysm) (HCC)    DDD (degenerative disc disease), lumbar    Hyperlipidemia    Hypertension    MI (myocardial infarction) (HCC)    Old myocardial infarction    Tobacco use disorder     Past Surgical History   Past Surgical History:  Procedure Laterality Date   COLONOSCOPY N/A 10/01/2015   Procedure: COLONOSCOPY;  Surgeon: West Bali, MD;  [facility];  Service: Endoscopy;  Laterality: N/A;  245   COLONOSCOPY N/A 04/09/2019   Procedure: COLONOSCOPY;  Surgeon: West Bali, MD;  [facility]: [facility];  Service: Endoscopy;  Laterality: N/A;  12:00   CORONARY ANGIOPLASTY WITH STENT PLACEMENT     EXCISION MASS LOWER EXTREMETIES Left 01/28/2016   Procedure: EXCISION LEFT BUTTOCKS SINUS TRACH;  Surgeon: Ovidio Kin, MD;  Location: [facility];  Service: General;  Laterality: Left;   LEFT HEART CATHETERIZATION WITH CORONARY ANGIOGRAM N/A 09/24/2012   Procedure: LEFT HEART CATHETERIZATION WITH CORONARY ANGIOGRAM;  Surgeon: Corky Crafts, MD;  Location: [facility] CATH LAB;  Service: Cardiovascular;  Laterality: N/A;   POLYPECTOMY  04/09/2019   Procedure: POLYPECTOMY;  Surgeon: West Bali, MD;  [facility];  Service: Endoscopy;;   TONSILLECTOMY      Past Family History   Family  History  Problem Relation Age of Onset   Cancer Mother    Heart attack Mother    Colon cancer Neg Hx     Past Social History   Social History   Socioeconomic History   Marital status: Single    Spouse name: Not on file   Number of children: Not on file   Years of education: Not on file   Highest education level: Not on file  Occupational History   Not on file  Tobacco Use   Smoking status: Former    Packs/day: 1.00    Years: 57.00    Additional pack years: 0.00    Total pack years: 57.00    Types: Cigarettes    Start date: 38    Quit date: 05/2022    Years since quitting: 0.6    Passive exposure: Past   Smokeless tobacco: Never  Vaping Use   Vaping Use: Never used  Substance and Sexual Activity   Alcohol use: No    Alcohol/week: 0.0 standard drinks of alcohol   Drug use: No    Types: Cocaine    Comment: per patient has not used in 4 years   Sexual activity: Not on file  Other Topics Concern   Not on file  Social History Narrative   Not on file   Social Determinants of Health   Financial Resource Strain: Not on file  Food Insecurity: No Food Insecurity (06/30/2022)   Hunger Vital Sign    Worried About Running Out of Food in the Last Year: Never true    Ran Out of Food in the Last Year: Never true  Transportation Needs: No Transportation Needs (06/30/2022)   PRAPARE - Administrator, Civil Service (Medical): No    Lack of Transportation (Non-Medical): No  Physical Activity: Not on file  Stress: Not on file  Social Connections: Not on file  Intimate Partner Violence: Not At Risk (06/30/2022)   Humiliation, Afraid, Rape, and Kick questionnaire    Fear of Current or Ex-Partner: No    Emotionally Abused: No    Physically Abused: No    Sexually Abused: No    Review of Systems   General: Negative for anorexia, weight loss, fever, chills, fatigue, weakness. ENT: Negative for hoarseness, difficulty swallowing , nasal congestion. CV: Negative for  chest pain, angina, palpitations, dyspnea on exertion, peripheral edema.  Respiratory: Negative for dyspnea at rest, dyspnea on exertion, cough, sputum, wheezing.  GI: See history of present illness. GU:  Negative for dysuria, hematuria, urinary incontinence, urinary frequency, nocturnal urination.  Endo: Negative for unusual weight  change.     Physical Exam   There were no vitals taken for this visit.   General: Well-nourished, well-developed in no acute distress.  Eyes: No icterus. Mouth: Oropharyngeal mucosa moist and pink , no lesions erythema or exudate. Lungs: Clear to auscultation bilaterally.  Heart: Regular rate and rhythm, no murmurs rubs or gallops.  Abdomen: Bowel sounds are normal, nontender, nondistended, no hepatosplenomegaly or masses,  no abdominal bruits or hernia , no rebound or guarding.  Rectal: ***  Extremities: No lower extremity edema. No clubbing or deformities. Neuro: Alert and oriented x 4   Skin: Warm and dry, no jaundice.   Psych: Alert and cooperative, normal mood and affect.  Labs   *** Imaging Studies   No results found.  Assessment       PLAN   ***   Leanna Battles. Melvyn Neth, MHS, PA-C [facility]

## 2023-01-23 ENCOUNTER — Encounter (HOSPITAL_COMMUNITY): Payer: Medicare HMO

## 2023-01-25 ENCOUNTER — Encounter (HOSPITAL_COMMUNITY): Payer: Medicare HMO

## 2023-01-26 ENCOUNTER — Other Ambulatory Visit (HOSPITAL_BASED_OUTPATIENT_CLINIC_OR_DEPARTMENT_OTHER): Payer: Self-pay | Admitting: Pulmonary Disease

## 2023-01-30 ENCOUNTER — Encounter (HOSPITAL_COMMUNITY)
Admission: RE | Admit: 2023-01-30 | Discharge: 2023-01-30 | Disposition: A | Discharge status: Home / Self Care | Payer: Medicare HMO | Source: Ambulatory Visit | Attending: Pulmonary Disease | Admitting: Pulmonary Disease

## 2023-01-30 VITALS — Wt 180.1 lb

## 2023-01-30 DIAGNOSIS — J432 Centrilobular emphysema: Secondary | ICD-10-CM | POA: Insufficient documentation

## 2023-01-30 NOTE — Progress Notes (Signed)
Daily Session Note  Patient Details  Name: Ruben Young MRN: [id number] Date of Birth: [birth year] Referring Provider:   Flowsheet Row PULMONARY REHAB OTHER RESP ORIENTATION from 01/15/2023 in [facility] CARDIAC REHABILITATION  Referring Provider Dr. Vassie Loll       Encounter Date: 01/30/2023  Check In:  Session Check In - 01/30/23 1330       Check-In   Supervising physician immediately available to respond to emergencies [facility] MD immediately available    Physician(s) Dr. Wyline Mood    Location [facility]    Staff Present Ross Ludwig, BS, Exercise Physiologist;Sharman Garrott, Margarite Gouge, RN, BSN;Other    Virtual Visit No    Medication changes reported     No    Fall or balance concerns reported    No    Tobacco Cessation No Change    Warm-up and Cool-down Performed as group-led instruction    Resistance Training Performed Yes    VAD Patient? No    PAD/SET Patient? No      Pain Assessment   Currently in Pain? No/denies    Pain Score 0-No pain    Multiple Pain Sites No             Capillary Blood Glucose: No results found for this or any previous visit (from the past 24 hour(s)).    Social History   Tobacco Use  Smoking Status Former   Packs/day: 1.00   Years: 57.00   Additional pack years: 0.00   Total pack years: 57.00   Types: Cigarettes   Start date: 1966   Quit date: 05/2022   Years since quitting: 0.6   Passive exposure: Past  Smokeless Tobacco Never    Goals Met:  Independence with exercise equipment Using PLB without cueing & demonstrates good technique Exercise tolerated well No report of concerns or symptoms today Strength training completed today  Goals Unmet:  Not Applicable  Comments: check out @ 2:30pm   Dr. Erick Blinks is Medical Director for [facility] Pulmonary Re[facility].

## 2023-01-30 NOTE — Progress Notes (Signed)
Daily Session Note  Patient Details  Name: Ruben Young MRN: [id number] Date of Birth: 1950-10-29 Referring Provider:   Flowsheet Row PULMONARY REHAB OTHER RESP ORIENTATION from 01/15/2023 in [facility] CARDIAC REHABILITATION  Referring Provider Dr. Vassie Loll       Encounter Date: 01/30/2023  Check In:  Session Check In - 01/30/23 1330       Check-In   Supervising physician immediately available to respond to emergencies [facility] MD immediately available    Physician(s) Dr. Wyline Mood    [facility]    Staff Present Ross Ludwig, BS, Exercise Physiologist;Keaun Schnabel, Margarite Gouge, RN, BSN;Other    Virtual Visit No    Medication changes reported     No    Fall or balance concerns reported    No    Tobacco Cessation No Change    Warm-up and Cool-down Performed as group-led instruction    Resistance Training Performed Yes    VAD Patient? No    PAD/SET Patient? No      Pain Assessment   Currently in Pain? No/denies    Pain Score 0-No pain    Multiple Pain Sites No             Capillary Blood Glucose: No results found for this or any previous visit (from the past 24 hour(s)).    Social History   Tobacco Use  Smoking Status Former   Packs/day: 1.00   Years: 57.00   Additional pack years: 0.00   Total pack years: 57.00   Types: Cigarettes   Start date: 1966   Quit date: 05/2022   Years since quitting: 0.6   Passive exposure: Past  Smokeless Tobacco Never    Goals Met:  Independence with exercise equipment Using PLB without cueing & demonstrates good technique Exercise tolerated well No report of concerns or symptoms today Strength training completed today  Goals Unmet:  Not Applicable  Comments: chesk ou t@ 2:30pm   Dr. Erick Blinks is Medical Director for [facility] Pulmonary Re[facility].

## 2023-02-01 ENCOUNTER — Encounter (HOSPITAL_COMMUNITY): Payer: Medicare HMO

## 2023-02-06 ENCOUNTER — Encounter (HOSPITAL_COMMUNITY)
Admission: RE | Admit: 2023-02-06 | Discharge: 2023-02-06 | Disposition: A | Discharge status: Home / Self Care | Payer: Medicare HMO | Source: Ambulatory Visit | Attending: Pulmonary Disease | Admitting: Pulmonary Disease

## 2023-02-06 DIAGNOSIS — J432 Centrilobular emphysema: Secondary | ICD-10-CM | POA: Diagnosis not present

## 2023-02-06 NOTE — Progress Notes (Signed)
Daily Session Note  Patient Details  Name: Ruben Young MRN: [id number] Date of Birth: Aug 11, 1950 Referring Provider:   Flowsheet Row PULMONARY REHAB OTHER RESP ORIENTATION from 01/15/2023 in [facility] CARDIAC REHABILITATION  Referring Provider Dr. Vassie Loll       Encounter Date: 02/06/2023  Check In:  Session Check In - 02/06/23 1330       Check-In   Supervising physician immediately available to respond to emergencies [facility] MD immediately available    Physician(s) Dr. Eden Emms    Location [facility]    Staff Present Fabio Pierce, MA, RCEP, CCRP, CCET;Rustyn Conery, RN    Virtual Visit No    Medication changes reported     No    Fall or balance concerns reported    No    Tobacco Cessation No Change    Warm-up and Cool-down Performed as group-led instruction    Resistance Training Performed Yes    VAD Patient? No    PAD/SET Patient? No      Pain Assessment   Currently in Pain? No/denies    Pain Score 0-No pain    Multiple Pain Sites No             Capillary Blood Glucose: No results found for this or any previous visit (from the past 24 hour(s)).    Social History   Tobacco Use  Smoking Status Former   Packs/day: 1.00   Years: 57.00   Additional pack years: 0.00   Total pack years: 57.00   Types: Cigarettes   Start date: 1966   Quit date: 05/2022   Years since quitting: 0.6   Passive exposure: Past  Smokeless Tobacco Never    Goals Met:  Proper associated with RPD/PD & O2 Sat Independence with exercise equipment Using PLB without cueing & demonstrates good technique Exercise tolerated well No report of concerns or symptoms today Strength training completed today  Goals Unmet:  Not Applicable  Comments: Pt able to follow exercise prescription today without complaint.  Will continue to monitor for progression.    Dr. Erick Blinks is Medical Director for [facility].

## 2023-02-08 ENCOUNTER — Encounter (HOSPITAL_COMMUNITY): Payer: Medicare HMO

## 2023-02-12 ENCOUNTER — Ambulatory Visit (INDEPENDENT_AMBULATORY_CARE_PROVIDER_SITE_OTHER): Payer: Medicare HMO | Admitting: Gastroenterology

## 2023-02-12 ENCOUNTER — Encounter: Payer: Self-pay | Admitting: Gastroenterology

## 2023-02-12 VITALS — BP 125/73 | HR 75 | Temp 98.4°F | Ht 69.0 in | Wt 181.4 lb

## 2023-02-12 DIAGNOSIS — R140 Abdominal distension (gaseous): Secondary | ICD-10-CM | POA: Diagnosis not present

## 2023-02-12 DIAGNOSIS — Z86010 Personal history of colonic polyps: Secondary | ICD-10-CM

## 2023-02-12 NOTE — Progress Notes (Signed)
GI Office Note    Referring Provider: Mirna Mires, MD Primary Care Physician:  Mirna Mires, MD  Primary Gastroenterologist: Hennie Duos. Marletta Lor, DO   Chief Complaint   Chief Complaint  Patient presents with   Follow-up    Due for colonoscopy    History of Present Illness   Ruben Young is a 72 y.o. male presenting today to discuss possibility of colonoscopy. Last seen 09/2022. History of adenomatous colon polyps. Due for surveillance colonoscopy at that time but held off due to recent diagnosis (06/2022) of bilateral PE, LE DVT on Eliquis.   Followed by pulmonology. Last seen in 11/2022. History of COPD as well. Reported SOB after walking 30 yards. In pulmonary rehab program. He quit smoking.  Continues anticoagulation with Eliquis.  Patient reports that he does okay when walking on flat surfaces.  He gets short of breath when he climbs 15 stairs at his house.  Sometimes gets short of breath when he bends over to pick stuff up.  He sleeps on his side.  Denies any cough.  Bowel movements are regular.  No melena or rectal bleeding.  No abdominal pain.  Occasional heartburn.  No nausea or vomiting.  His weight is stable.  He wants to proceed with colonoscopy if possible.  He also wants to know why his abdomen is distended.  States his friends pick on him saying he looks pregnant.  No history of alcohol use.  No lower extremity edema.   Colonoscopy 04/2019: -six to 6 mm polyps in the sigmoid colon, splenic flexure, hepatic flexure, descending colon, cecum removed -Diverticulosis in the rectosigmoid colon and sigmoid colon -External and internal hemorrhoids -Repeat colonoscopy in 3 years   Medications   Current Outpatient Medications  Medication Sig Dispense Refill   albuterol (VENTOLIN HFA) 108 (90 Base) MCG/ACT inhaler Inhale 2 puffs into the lungs every 4 (four) hours as needed for wheezing or shortness of breath. 6.7 g 1   amLODipine (NORVASC) 10 MG tablet TAKE 1 TABLET(10  MG) BY MOUTH DAILY 90 tablet 3   apixaban (ELIQUIS) 5 MG TABS tablet TAKE 1 TABLET(5 MG) BY MOUTH TWICE DAILY 60 tablet 2   Ascorbic Acid (VITAMIN C PO) Take 1,000 mg by mouth every other day.     aspirin EC 81 MG tablet Take 81 mg by mouth daily.     atorvastatin (LIPITOR) 40 MG tablet TAKE 1 TABLET BY MOUTH DAILY AT 6 PM 90 tablet 2   B Complex Vitamins (B COMPLEX PO) Take 1 capsule by mouth every other day.     CALCIUM PO Take 1 tablet by mouth every other day.     carvedilol (COREG) 6.25 MG tablet TAKE 1 TABLET(6.25 MG) BY MOUTH TWICE DAILY WITH A MEAL 180 tablet 2   Cholecalciferol (VITAMIN D3) 25 MCG (1000 UT) CAPS Take 1,000 Units by mouth daily.      ferrous sulfate 325 (65 FE) MG EC tablet Take 325 mg by mouth every other day.     GINSENG PO Take 1 capsule by mouth every other day.     hydrochlorothiazide (HYDRODIURIL) 25 MG tablet Take 25 mg by mouth daily.     hydrocortisone cream 1 % Apply 1 Application topically daily as needed for itching.     Multiple Vitamin (MULTIVITAMIN WITH MINERALS) TABS tablet Take 1 tablet by mouth daily.     nitroGLYCERIN (NITROSTAT) 0.4 MG SL tablet Place 1 tablet (0.4 mg total) under the tongue every 5 (five)  minutes x 3 doses as needed for chest pain. For chest pains 25 tablet 0   Omega-3 Fatty Acids (FISH OIL) 1000 MG CAPS Take 1,000 mg by mouth every other day.     POTASSIUM PO Take 1 tablet by mouth every other day.     vitamin E 400 UNIT capsule Take 400 Units by mouth every other day.     No current facility-administered medications for this visit.    Allergies   Allergies as of 02/12/2023   (No Known Allergies)     Past Medical History   Past Medical History:  Diagnosis Date   AAA (abdominal aortic aneurysm) (HCC)    DDD (degenerative disc disease), lumbar    DVT (deep venous thrombosis) (HCC)    Hyperlipidemia    Hypertension    MI (myocardial infarction) (HCC)    Old myocardial infarction    Pulmonary emboli (HCC)    Tobacco  use disorder     Past Surgical History   Past Surgical History:  Procedure Laterality Date   COLONOSCOPY N/A 10/01/2015   Procedure: COLONOSCOPY;  Surgeon: West Bali, MD;  [facility];  Service: Endoscopy;  Laterality: N/A;  245   COLONOSCOPY N/A 04/09/2019   Procedure: COLONOSCOPY;  Surgeon: West Bali, MD;  [facility];  Service: Endoscopy;  Laterality: N/A;  12:00   CORONARY ANGIOPLASTY WITH STENT PLACEMENT     EXCISION MASS LOWER EXTREMETIES Left 01/28/2016   Procedure: EXCISION LEFT BUTTOCKS SINUS TRACH;  Surgeon: Ovidio Kin, MD;  Location: [facility];  Service: General;  Laterality: Left;   LEFT HEART CATHETERIZATION WITH CORONARY ANGIOGRAM N/A 09/24/2012   Procedure: LEFT HEART CATHETERIZATION WITH CORONARY ANGIOGRAM;  Surgeon: Corky Crafts, MD;  Location: [facility] CATH LAB;  Service: Cardiovascular;  Laterality: N/A;   POLYPECTOMY  04/09/2019   Procedure: POLYPECTOMY;  Surgeon: West Bali, MD;  [facility];  Service: Endoscopy;;   TONSILLECTOMY      Past Family History   Family History  Problem Relation Age of Onset   Cancer Mother    Heart attack Mother    Colon cancer Neg Hx     Past Social History   Social History   Socioeconomic History   Marital status: Single    Spouse name: Not on file   Number of children: Not on file   Years of education: Not on file   Highest education level: Not on file  Occupational History   Not on file  Tobacco Use   Smoking status: Former    Current packs/day: 0.00    Average packs/day: 1 pack/day for 57.8 years (57.8 ttl pk-yrs)    Types: Cigarettes    Start date: 59    Quit date: 05/2022    Years since quitting: 0.7    Passive exposure: Past   Smokeless tobacco: Never  Vaping Use   Vaping status: Never Used  Substance and Sexual Activity   Alcohol use: No    Alcohol/week: 0.0 standard drinks of alcohol   Drug use: No    Types: Cocaine    Comment: per patient has not used in  4 years   Sexual activity: Not on file  Other Topics Concern   Not on file  Social History Narrative   Not on file   Social Determinants of Health   Financial Resource Strain: Not on file  Food Insecurity: No Food Insecurity (06/30/2022)   Hunger Vital Sign    Worried About  Running Out of Food in the Last Year: Never true    Ran Out of Food in the Last Year: Never true  Transportation Needs: No Transportation Needs (06/30/2022)   PRAPARE - Administrator, Civil Service (Medical): No    Lack of Transportation (Non-Medical): No  Physical Activity: Not on file  Stress: Not on file  Social Connections: Not on file  Intimate Partner Violence: Not At Risk (06/30/2022)   Humiliation, Afraid, Rape, and Kick questionnaire    Fear of Current or Ex-Partner: No    Emotionally Abused: No    Physically Abused: No    Sexually Abused: No    Review of Systems   General: Negative for anorexia, weight loss, fever, chills, fatigue, weakness. ENT: Negative for hoarseness, difficulty swallowing , nasal congestion. CV: Negative for chest pain, angina, palpitations, +dyspnea on exertion, No peripheral edema.  Respiratory: Negative for dyspnea at rest, +dyspnea on exertion, No cough, sputum, wheezing.  GI: See history of present illness. GU:  Negative for dysuria, hematuria, urinary incontinence, urinary frequency, nocturnal urination.  Endo: Negative for unusual weight change.     Physical Exam   BP 125/73 (BP Location: Right Arm, Patient Position: Sitting, Cuff Size: Large)   Pulse 75   Temp 98.4 F (36.9 C) (Oral)   Ht 5\' 9"  (1.753 m)   Wt 181 lb 6.4 oz (82.3 kg)   SpO2 92%   BMI 26.79 kg/m    General: Well-nourished, well-developed in no acute distress.  Eyes: No icterus. Mouth: Oropharyngeal mucosa moist and pink , no lesions erythema or exudate. Lungs: Clear to auscultation bilaterally.  Heart: Regular rate and rhythm, no murmurs rubs or gallops.  Abdomen: Bowel sounds  are normal, nontender, no hepatosplenomegaly or masses,  no abdominal bruits, no rebound or guarding. Some distention, tight. Rectus diastasis, small umb hernia easily reducible and nontender Rectal: not performed Extremities: No lower extremity edema. No clubbing or deformities. Neuro: Alert and oriented x 4   Skin: Warm and dry, no jaundice.   Psych: Alert and cooperative, normal mood and affect.  Labs   Lab Results  Component Value Date   WBC 8.1 07/02/2022   HGB 13.6 07/02/2022   HCT 42.7 07/02/2022   MCV 89.9 07/02/2022   PLT 210 07/02/2022   Lab Results  Component Value Date   NA 141 07/02/2022   CL 108 07/02/2022   K 3.7 07/02/2022   CO2 26 07/02/2022   BUN 14 07/02/2022   CREATININE 0.98 07/02/2022   GFRNONAA >60 07/02/2022   CALCIUM 9.1 07/02/2022   ALBUMIN 3.3 (L) 06/30/2022   GLUCOSE 107 (H) 07/02/2022   Lab Results  Component Value Date   ALT 15 06/30/2022   AST 19 06/30/2022   ALKPHOS 86 06/30/2022   BILITOT 0.4 06/30/2022    Imaging Studies   No results found.  Assessment   *H/O adenomatous colon polyps *Abdominal distention  Overdue for 3-year surveillance colonoscopy for history of adenomatous colon polyps.  Workup delayed due to PE/DVTs as outlined above.  It has been 6 months since he was started on anticoagulation for clots.  Continues pulmonary rehab.  Last seen by pulmonology in May.  We will reach out to pulmonology to see if okay to proceed with colonoscopy and hold anticoagulation for 48 hours.  Patient requested to also reach out to his PCP for the same.  He is concerned about abdominal distention, denies any abdominal pain.  May impart be more prominent due to  diastases recti and small umbilical hernia.  He moves his bowels daily.  Less likely due to ascites.   PLAN   Abdominal ultrasound. Colonoscopy in the near future pending approval by pulmonology.  Requesting clearance as well as approval to hold Eliquis 48 hours for the  procedure.  I have discussed the risks, alternatives, benefits with regards to but not limited to the risk of reaction to medication, bleeding, infection, perforation and the patient is agreeable to proceed. Written consent to be obtained. Consider screening for Hep C if not done before.    Leanna Battles. Melvyn Neth, MHS, PA-C [facility]

## 2023-02-12 NOTE — Patient Instructions (Signed)
Abdominal ultrasound to evaluate abdominal distention. We will be in touch to schedule colonoscopy once we get approval to hold Eliquis two days for the procedure.

## 2023-02-13 ENCOUNTER — Encounter (HOSPITAL_COMMUNITY)
Admission: RE | Admit: 2023-02-13 | Discharge: 2023-02-13 | Disposition: A | Discharge status: Home / Self Care | Payer: Medicare HMO | Source: Ambulatory Visit | Attending: Pulmonary Disease | Admitting: Pulmonary Disease

## 2023-02-13 ENCOUNTER — Telehealth: Payer: Self-pay | Admitting: *Deleted

## 2023-02-13 DIAGNOSIS — J432 Centrilobular emphysema: Secondary | ICD-10-CM

## 2023-02-13 NOTE — Progress Notes (Signed)
Daily Session Note  Patient Details  Name: Ruben Young MRN: [id number] Date of Birth: Jun 10, 1951 Referring Provider:   Flowsheet Row PULMONARY REHAB OTHER RESP ORIENTATION from 01/15/2023 in [facility] CARDIAC REHABILITATION  Referring Provider Dr. Vassie Loll       Encounter Date: 02/13/2023  Check In:  Session Check In - 02/13/23 1330       Check-In   Supervising physician immediately available to respond to emergencies [facility] MD immediately available    Physician(s) Dr. Diona Browner    Location [facility]    Staff Present Ross Ludwig, BS, Exercise Physiologist;Jeanine Caven, RN;Daphyne Daphine Deutscher, RN, BSN    Virtual Visit No    Medication changes reported     No    Fall or balance concerns reported    No    Tobacco Cessation No Change    Warm-up and Cool-down Performed as group-led instruction    Resistance Training Performed Yes    VAD Patient? No    PAD/SET Patient? No      Pain Assessment   Currently in Pain? No/denies    Pain Score 0-No pain    Multiple Pain Sites No             Capillary Blood Glucose: No results found for this or any previous visit (from the past 24 hour(s)).    Social History   Tobacco Use  Smoking Status Former   Current packs/day: 0.00   Average packs/day: 1 pack/day for 57.8 years (57.8 ttl pk-yrs)   Types: Cigarettes   Start date: 1966   Quit date: 05/2022   Years since quitting: 0.7   Passive exposure: Past  Smokeless Tobacco Never    Goals Met:  Independence with exercise equipment Using PLB without cueing & demonstrates good technique Exercise tolerated well No report of concerns or symptoms today Strength training completed today  Goals Unmet:  Not Applicable  Comments: Pt able to follow exercise prescription today without complaint.  Will continue to monitor for progression.    Dr. Erick Blinks is Medical Director for [facility] Pulmonary Re[facility].

## 2023-02-13 NOTE — Telephone Encounter (Signed)
  Request for patient to stop medication Eliquis x 2 days prior and clearance to have colonoscopy  When is surgery scheduled? TBD  Name of physician performing surgery?  Dr. Earnest Bailey  [facility] at [facility] Phone: [phone] Fax: [phone]  Anethesia type (none, local, MAC, general)? MAC

## 2023-02-14 ENCOUNTER — Encounter (HOSPITAL_COMMUNITY): Payer: Self-pay | Admitting: *Deleted

## 2023-02-14 DIAGNOSIS — J432 Centrilobular emphysema: Secondary | ICD-10-CM

## 2023-02-14 NOTE — Progress Notes (Signed)
Pulmonary Individual Treatment Plan  Patient Details  Name: Ruben Young MRN: [id number] Date of Birth: April 28, 1951 Referring Provider:   Flowsheet Row PULMONARY REHAB OTHER RESP ORIENTATION from 01/15/2023 in [facility] CARDIAC REHABILITATION  Referring Provider Dr. Vassie Loll       Initial Encounter Date:  Flowsheet Row PULMONARY REHAB OTHER RESP ORIENTATION from 01/15/2023 in [facility] CARDIAC REHABILITATION  Date 01/15/23       Visit Diagnosis: Centrilobular emphysema (HCC)  Patient's Home Medications on Admission:   Current Outpatient Medications:    albuterol (VENTOLIN HFA) 108 (90 Base) MCG/ACT inhaler, Inhale 2 puffs into the lungs every 4 (four) hours as needed for wheezing or shortness of breath., Disp: 6.7 g, Rfl: 1   amLODipine (NORVASC) 10 MG tablet, TAKE 1 TABLET(10 MG) BY MOUTH DAILY, Disp: 90 tablet, Rfl: 3   apixaban (ELIQUIS) 5 MG TABS tablet, TAKE 1 TABLET(5 MG) BY MOUTH TWICE DAILY, Disp: 60 tablet, Rfl: 2   Ascorbic Acid (VITAMIN C PO), Take 1,000 mg by mouth every other day., Disp: , Rfl:    aspirin EC 81 MG tablet, Take 81 mg by mouth daily., Disp: , Rfl:    atorvastatin (LIPITOR) 40 MG tablet, TAKE 1 TABLET BY MOUTH DAILY AT 6 PM, Disp: 90 tablet, Rfl: 2   B Complex Vitamins (B COMPLEX PO), Take 1 capsule by mouth every other day., Disp: , Rfl:    CALCIUM PO, Take 1 tablet by mouth every other day., Disp: , Rfl:    carvedilol (COREG) 6.25 MG tablet, TAKE 1 TABLET(6.25 MG) BY MOUTH TWICE DAILY WITH A MEAL, Disp: 180 tablet, Rfl: 2   Cholecalciferol (VITAMIN D3) 25 MCG (1000 UT) CAPS, Take 1,000 Units by mouth daily. , Disp: , Rfl:    ferrous sulfate 325 (65 FE) MG EC tablet, Take 325 mg by mouth every other day., Disp: , Rfl:    GINSENG PO, Take 1 capsule by mouth every other day., Disp: , Rfl:    hydrochlorothiazide (HYDRODIURIL) 25 MG tablet, Take 25 mg by mouth daily., Disp: , Rfl:    hydrocortisone cream 1 %, Apply 1 Application topically daily as needed  for itching., Disp: , Rfl:    Multiple Vitamin (MULTIVITAMIN WITH MINERALS) TABS tablet, Take 1 tablet by mouth daily., Disp: , Rfl:    nitroGLYCERIN (NITROSTAT) 0.4 MG SL tablet, Place 1 tablet (0.4 mg total) under the tongue every 5 (five) minutes x 3 doses as needed for chest pain. For chest pains, Disp: 25 tablet, Rfl: 0   Omega-3 Fatty Acids (FISH OIL) 1000 MG CAPS, Take 1,000 mg by mouth every other day., Disp: , Rfl:    POTASSIUM PO, Take 1 tablet by mouth every other day., Disp: , Rfl:    vitamin E 400 UNIT capsule, Take 400 Units by mouth every other day., Disp: , Rfl:   Past Medical History: Past Medical History:  Diagnosis Date   AAA (abdominal aortic aneurysm) (HCC)    DDD (degenerative disc disease), lumbar    DVT (deep venous thrombosis) (HCC)    Hyperlipidemia    Hypertension    MI (myocardial infarction) (HCC)    Old myocardial infarction    Pulmonary emboli (HCC)    Tobacco use disorder     Tobacco Use: Social History   Tobacco Use  Smoking Status Former   Current packs/day: 0.00   Average packs/day: 1 pack/day for 57.8 years (57.8 ttl pk-yrs)   Types: Cigarettes   Start date: 1966   Quit  date: 05/2022   Years since quitting: 0.7   Passive exposure: Past  Smokeless Tobacco Never    Labs: Review Flowsheet  More data exists      Latest Ref Rng & Units 12/15/2011 09/24/2012 09/25/2012 04/14/2014 06/30/2022  Labs for ITP Cardiac and Pulmonary Rehab  Cholestrol 0 - 200 mg/dL - - 161  096  -  LDL (calc) 0 - 99 mg/dL - - 81  63  -  HDL-C >04.54 mg/dL - - 34  09.81  -  Trlycerides 0.0 - 149.0 mg/dL - - 91  19.1  -  PH, Arterial 7.35 - 7.45 - - - - 7.5   PCO2 arterial 32 - 48 mmHg - - - - 38   Bicarbonate 20.0 - 28.0 mmol/L - - - - 29.7   TCO2 0 - 100 mmol/L 28  24  - - -  O2 Saturation % - - - - 84.7     Details            Capillary Blood Glucose: No results found for: "GLUCAP"   Pulmonary Assessment Scores:  Pulmonary Assessment Scores     Row  Name 01/15/23 1316         ADL UCSD   ADL Phase Entry     SOB Score total 40     Rest 1     Walk 1     Stairs 3     Bath 2     Dress 2     Shop 3       CAT Score   CAT Score 21       mMRC Score   mMRC Score 3             UCSD: Self-administered rating of dyspnea associated with activities of daily living (ADLs) 6-point scale (0 = "not at all" to 5 = "maximal or unable to do because of breathlessness")  Scoring Scores range from 0 to 120.  Minimally important difference is 5 units  CAT: CAT can identify the health impairment of COPD patients and is better correlated with disease progression.  CAT has a scoring range of zero to 40. The CAT score is classified into four groups of low (less than 10), medium (10 - 20), high (21-30) and very high (31-40) based on the impact level of disease on health status. A CAT score over 10 suggests significant symptoms.  A worsening CAT score could be explained by an exacerbation, poor medication adherence, poor inhaler technique, or progression of COPD or comorbid conditions.  CAT MCID is 2 points  mMRC: mMRC (Modified Medical Research Council) Dyspnea Scale is used to assess the degree of baseline functional disability in patients of respiratory disease due to dyspnea. No minimal important difference is established. A decrease in score of 1 point or greater is considered a positive change.   Pulmonary Function Assessment:   Exercise Target Goals: Exercise Program Goal: Individual exercise prescription set using results from initial 6 min walk test and THRR while considering  patient's activity barriers and safety.   Exercise Prescription Goal: Initial exercise prescription builds to 30-45 minutes a day of aerobic activity, 2-3 days per week.  Home exercise guidelines will be given to patient during program as part of exercise prescription that the participant will acknowledge.  Activity Barriers & Risk Stratification:  Activity  Barriers & Cardiac Risk Stratification - 01/15/23 1243       Activity Barriers & Cardiac Risk Stratification   Activity Barriers  Arthritis;Back Problems;Deconditioning;Shortness of Breath    Cardiac Risk Stratification High             6 Minute Walk:  6 Minute Walk     Row Name 01/15/23 1337         6 Minute Walk   Phase Initial     Distance 1200 feet     Walk Time 6 minutes     # of Rest Breaks 0     MPH 2.27     METS 2.61     RPE 12     Perceived Dyspnea  13     VO2 Peak 9.15     Symptoms No     Resting HR 73 bpm     Resting BP 112/50     Resting Oxygen Saturation  96 %     Exercise Oxygen Saturation  during 6 min walk 90 %     Max Ex. HR 92 bpm     Max Ex. BP 136/60     2 Minute Post BP 118/60       Interval HR   1 Minute HR 80     2 Minute HR 81     3 Minute HR 87     4 Minute HR 92     5 Minute HR 90     6 Minute HR 92     2 Minute Post HR 76     Interval Heart Rate? Yes       Interval Oxygen   Interval Oxygen? Yes     Baseline Oxygen Saturation % 96 %     1 Minute Oxygen Saturation % 92 %     1 Minute Liters of Oxygen 0 L     2 Minute Oxygen Saturation % 92 %     2 Minute Liters of Oxygen 0 L     3 Minute Oxygen Saturation % 90 %     3 Minute Liters of Oxygen 0 L     4 Minute Oxygen Saturation % 91 %     4 Minute Liters of Oxygen 0 L     5 Minute Oxygen Saturation % 92 %     5 Minute Liters of Oxygen 0 L     6 Minute Oxygen Saturation % 93 %     6 Minute Liters of Oxygen 0 L     2 Minute Post Oxygen Saturation % 96 %     2 Minute Post Liters of Oxygen 0 L              Oxygen Initial Assessment:  Oxygen Initial Assessment - 01/15/23 1316       Initial 6 min Walk   Oxygen Used None             Oxygen Re-Evaluation:  Oxygen Re-Evaluation     Row Name 01/15/23 1316             Program Oxygen Prescription   Program Oxygen Prescription --         Home Oxygen   Home Oxygen Device --       Sleep Oxygen Prescription --        Home Exercise Oxygen Prescription --       Home Resting Oxygen Prescription --         Goals/Expected Outcomes   Short Term Goals --       Long  Term Goals --  Oxygen Discharge (Final Oxygen Re-Evaluation):  Oxygen Re-Evaluation - 01/15/23 1316       Program Oxygen Prescription   Program Oxygen Prescription --      Home Oxygen   Home Oxygen Device --    Sleep Oxygen Prescription --    Home Exercise Oxygen Prescription --    Home Resting Oxygen Prescription --      Goals/Expected Outcomes   Short Term Goals --    Long  Term Goals --             Initial Exercise Prescription:  Initial Exercise Prescription - 01/15/23 1300       Date of Initial Exercise RX and Referring Provider   Date 01/15/23    Referring Provider Dr. Vassie Loll    Expected Discharge Date 05/22/23      Treadmill   MPH 2    Grade 0    Minutes 17      NuStep   Level 1    SPM 60    Minutes 22      Prescription Details   Frequency (times per week) 2    Duration Progress to 30 minutes of continuous aerobic without signs/symptoms of physical distress      Intensity   THRR 40-80% of Max Heartrate 59.118    Ratings of Perceived Exertion 11-13    Perceived Dyspnea 0-4      Resistance Training   Training Prescription Yes    Weight 3    Reps 10-15             Perform Capillary Blood Glucose checks as needed.  Exercise Prescription Changes:   Exercise Prescription Changes     Row Name 01/30/23 1400             Response to Exercise   Blood Pressure (Admit) 116/60       Blood Pressure (Exercise) 130/60       Blood Pressure (Exit) 126/64       Heart Rate (Admit) 69 bpm       Heart Rate (Exercise) 106 bpm       Heart Rate (Exit) 75 bpm       Oxygen Saturation (Admit) 94 %       Oxygen Saturation (Exercise) 92 %       Oxygen Saturation (Exit) 97 %       Rating of Perceived Exertion (Exercise) 12       Perceived Dyspnea (Exercise) 12       Duration Continue  with 30 min of aerobic exercise without signs/symptoms of physical distress.       Intensity THRR unchanged         Progression   Progression Continue to progress workloads to maintain intensity without signs/symptoms of physical distress.         Resistance Training   Training Prescription Yes       Weight 3       Reps 10-15       Time 10 Minutes         Treadmill   MPH 1.7       Grade 0       Minutes 17       METs 2.3         NuStep   Level 1       SPM 59       Minutes 22       METs 1.8  Exercise Comments:   Exercise Goals and Review:   Exercise Goals     Row Name 01/15/23 1340             Exercise Goals   Increase Physical Activity Yes       Intervention Provide advice, education, support and counseling about physical activity/exercise needs.;Develop an individualized exercise prescription for aerobic and resistive training based on initial evaluation findings, risk stratification, comorbidities and participant's personal goals.       Expected Outcomes Short Term: Attend rehab on a regular basis to increase amount of physical activity.;Long Term: Add in home exercise to make exercise part of routine and to increase amount of physical activity.;Long Term: Exercising regularly at least 3-5 days a week.       Increase Strength and Stamina Yes       Intervention Provide advice, education, support and counseling about physical activity/exercise needs.;Develop an individualized exercise prescription for aerobic and resistive training based on initial evaluation findings, risk stratification, comorbidities and participant's personal goals.       Expected Outcomes Short Term: Increase workloads from initial exercise prescription for resistance, speed, and METs.;Long Term: Improve cardiorespiratory fitness, muscular endurance and strength as measured by increased METs and functional capacity ( );Short Term: Perform resistance training exercises routinely  during rehab and add in resistance training at home       Able to understand and use rate of perceived exertion (RPE) scale Yes       Intervention Provide education and explanation on how to use RPE scale       Expected Outcomes Short Term: Able to use RPE daily in rehab to express subjective intensity level;Long Term:  Able to use RPE to guide intensity level when exercising independently       Able to understand and use Dyspnea scale Yes       Intervention Provide education and explanation on how to use Dyspnea scale       Expected Outcomes Short Term: Able to use Dyspnea scale daily in rehab to express subjective sense of shortness of breath during exertion;Long Term: Able to use Dyspnea scale to guide intensity level when exercising independently       Knowledge and understanding of Target Heart Rate Range (THRR) Yes       Intervention Provide education and explanation of THRR including how the numbers were predicted and where they are located for reference       Expected Outcomes Short Term: Able to state/look up THRR;Long Term: Able to use THRR to govern intensity when exercising independently;Short Term: Able to use daily as guideline for intensity in rehab       Understanding of Exercise Prescription Yes       Intervention Provide education, explanation, and written materials on patient's individual exercise prescription       Expected Outcomes Short Term: Able to explain program exercise prescription;Long Term: Able to explain home exercise prescription to exercise independently                Exercise Goals Re-Evaluation :   Discharge Exercise Prescription (Final Exercise Prescription Changes):  Exercise Prescription Changes - 01/30/23 1400       Response to Exercise   Blood Pressure (Admit) 116/60    Blood Pressure (Exercise) 130/60    Blood Pressure (Exit) 126/64    Heart Rate (Admit) 69 bpm    Heart Rate (Exercise) 106 bpm    Heart Rate (Exit) 75 bpm    Oxygen  Saturation (  Admit) 94 %    Oxygen Saturation (Exercise) 92 %    Oxygen Saturation (Exit) 97 %    Rating of Perceived Exertion (Exercise) 12    Perceived Dyspnea (Exercise) 12    Duration Continue with 30 min of aerobic exercise without signs/symptoms of physical distress.    Intensity THRR unchanged      Progression   Progression Continue to progress workloads to maintain intensity without signs/symptoms of physical distress.      Resistance Training   Training Prescription Yes    Weight 3    Reps 10-15    Time 10 Minutes      Treadmill   MPH 1.7    Grade 0    Minutes 17    METs 2.3      NuStep   Level 1    SPM 59    Minutes 22    METs 1.8             Nutrition:  Target Goals: Understanding of nutrition guidelines, daily intake of sodium 1500mg , cholesterol 200mg , calories 30% from fat and 7% or less from saturated fats, daily to have 5 or more servings of fruits and vegetables.  Biometrics:  Pre Biometrics - 01/15/23 1340       Pre Biometrics   Height 5\' 9"  (1.753 m)    Weight 179 lb 7.3 oz (81.4 kg)    Waist Circumference 42 inches    Hip Circumference 39 inches    Waist to Hip Ratio 1.08 %    BMI (Calculated) 26.49    Triceps Skinfold 15 mm    % Body Fat 28.2 %    Grip Strength 27.7 kg    Flexibility 0 in    Single Leg Stand 0 seconds              Nutrition Therapy Plan and Nutrition Goals:  Nutrition Therapy & Goals - 01/15/23 1318       Nutrition Therapy   RD appointment deferred Yes      Personal Nutrition Goals   Comments Patient's diet assessment score was 109. He says he cooks his own meals. Handout provided and explained regarding healthier choices and referral to RD offered. Patient declined. He says he is happy with his diet. We will continue to monitor.      Intervention Plan   Intervention Nutrition handout(s) given to patient.    Expected Outcomes Short Term Goal: Understand basic principles of dietary content, such as  calories, fat, sodium, cholesterol and nutrients.             Nutrition Assessments:  Nutrition Assessments - 01/15/23 1318       MEDFICTS Scores   Pre Score 109            MEDIFICTS Score Key: ?70 Need to make dietary changes  40-70 Heart Healthy Diet ? 40 Therapeutic Level Cholesterol Diet   Picture Your Plate Scores: <82 Unhealthy dietary pattern with much room for improvement. 41-50 Dietary pattern unlikely to meet recommendations for good health and room for improvement. 51-60 More healthful dietary pattern, with some room for improvement.  >60 Healthy dietary pattern, although there may be some specific behaviors that could be improved.    Nutrition Goals Re-Evaluation:   Nutrition Goals Discharge (Final Nutrition Goals Re-Evaluation):   Psychosocial: Target Goals: Acknowledge presence or absence of significant depression and/or stress, maximize coping skills, provide positive support system. Participant is able to verbalize types and ability to use techniques and  skills needed for reducing stress and depression.  Initial Review & Psychosocial Screening:  Initial Psych Review & Screening - 01/15/23 1322       Initial Review   Current issues with Current Sleep Concerns      Family Dynamics   Good Support System? Yes      Barriers   Psychosocial barriers to participate in program There are no identifiable barriers or psychosocial needs.;The patient should benefit from training in stress management and relaxation.      Screening Interventions   Interventions To provide support and resources with identified psychosocial needs;Provide feedback about the scores to participant;Encouraged to exercise    Expected Outcomes Short Term goal: Identification and review with participant of any Quality of Life or Depression concerns found by scoring the questionnaire.;Short Term goal: Utilizing psychosocial counselor, staff and physician to assist with identification of  specific Stressors or current issues interfering with healing process. Setting desired goal for each stressor or current issue identified.             Quality of Life Scores:  Quality of Life - 01/15/23 1341       Quality of Life   Select Quality of Life      Quality of Life Scores   Health/Function Pre 18.54 %    Socioeconomic Pre 20.67 %    Psych/Spiritual Pre 21.07 %    Family Pre 21 %    GLOBAL Pre 19.71 %            Scores of 19 and below usually indicate a poorer quality of life in these areas.  A difference of  2-3 points is a clinically meaningful difference.  A difference of 2-3 points in the total score of the Quality of Life Index has been associated with significant improvement in overall quality of life, self-image, physical symptoms, and general health in studies assessing change in quality of life.   PHQ-9: Review Flowsheet       01/15/2023  Depression screen PHQ 2/9  Decreased Interest 3  Down, Depressed, Hopeless 0  PHQ - 2 Score 3  Altered sleeping 1  Tired, decreased energy 3  Change in appetite 0  Feeling bad or failure about yourself  0  Trouble concentrating 0  Moving slowly or fidgety/restless 0  Suicidal thoughts 0  PHQ-9 Score 7  Difficult doing work/chores Not difficult at all    Details           Interpretation of Total Score  Total Score Depression Severity:  1-4 = Minimal depression, 5-9 = Mild depression, 10-14 = Moderate depression, 15-19 = Moderately severe depression, 20-27 = Severe depression   Psychosocial Evaluation and Intervention:  Psychosocial Evaluation - 01/15/23 1323       Psychosocial Evaluation & Interventions   Interventions Stress management education;Relaxation education;Encouraged to exercise with the program and follow exercise prescription    Comments Patient has no psychosocial barriers identified at his orientation visit. His initial PHQ-9 score was 7 due to him not having any energy to do anything  and his SOB. He denies any depression or anxiety. He does say he has trouble staying asleep sometimes but does not take anything. He says this is not a problem. He says he has support from his brother-in-law and some friends. He does have 3 children but says he does not talk to them often. He lives alone and uses [employer] for his transportation. He says he plans to set this up himself. He is ready  to start the program hoping to gain some energy and have less SOB.    Expected Outcomes Patient will continue to have no psychosocial barriers identified.    Continue Psychosocial Services  No Follow up required             Psychosocial Re-Evaluation:  Psychosocial Re-Evaluation     Row Name 02/05/23 1422             Psychosocial Re-Evaluation   Current issues with None Identified       Comments Patient is new to program starting his first session.  His intial QOL score is 19.71%  and his PHQ 9 score is 7 .  Patient  was referred to PR by Dr.Rakesh with diagnosis of centrilobular emphysema.  He seems to enjoy coming to the program and  interactive with class and staff, also very talkeative and friendly.   Patient has no psychosocial issues noted at this time.  He demonstrates an interest in improving his breathing health.       Expected Outcomes Patient will  have no psychosocial issues identified at dischage.       Interventions Stress management education;Relaxation education;Encouraged to attend Pulmonary Rehabilitation for the exercise       Continue Psychosocial Services  No Follow up required                Psychosocial Discharge (Final Psychosocial Re-Evaluation):  Psychosocial Re-Evaluation - 02/05/23 1422       Psychosocial Re-Evaluation   Current issues with None Identified    Comments Patient is new to program starting his first session.  His intial QOL score is 19.71%  and his PHQ 9 score is 7 .  Patient  was referred to PR by Dr.Rakesh with diagnosis of centrilobular  emphysema.  He seems to enjoy coming to the program and  interactive with class and staff, also very talkeative and friendly.   Patient has no psychosocial issues noted at this time.  He demonstrates an interest in improving his breathing health.    Expected Outcomes Patient will  have no psychosocial issues identified at dischage.    Interventions Stress management education;Relaxation education;Encouraged to attend Pulmonary Rehabilitation for the exercise    Continue Psychosocial Services  No Follow up required              Education: Education Goals: Education classes will be provided on a weekly basis, covering required topics. Participant will state understanding/return demonstration of topics presented.  Learning Barriers/Preferences:  Learning Barriers/Preferences - 01/15/23 1319       Learning Barriers/Preferences   Learning Barriers None    Learning Preferences Audio             Education Topics: How Lungs Work and Diseases: - Discuss the anatomy of the lungs and diseases that can affect the lungs, such as COPD.   Exercise: -Discuss the importance of exercise, FITT principles of exercise, normal and abnormal responses to exercise, and how to exercise safely.   Environmental Irritants: -Discuss types of environmental irritants and how to limit exposure to environmental irritants.   Meds/Inhalers and oxygen: - Discuss respiratory medications, definition of an inhaler and oxygen, and the proper way to use an inhaler and oxygen.   Energy Saving Techniques: - Discuss methods to conserve energy and decrease shortness of breath when performing activities of daily living.    Bronchial Hygiene / Breathing Techniques: - Discuss breathing mechanics, pursed-lip breathing technique,  proper posture, effective ways to clear airways,  and other functional breathing techniques   Cleaning Equipment: - Provides group verbal and written instruction about the health risks  of elevated stress, cause of high stress, and healthy ways to reduce stress.   Nutrition I: Fats: - Discuss the types of cholesterol, what cholesterol does to the body, and how cholesterol levels can be controlled.   Nutrition II: Labels: -Discuss the different components of food labels and how to read food labels.   Respiratory Infections: - Discuss the signs and symptoms of respiratory infections, ways to prevent respiratory infections, and the importance of seeking medical treatment when having a respiratory infection.   Stress I: Signs and Symptoms: - Discuss the causes of stress, how stress may lead to anxiety and depression, and ways to limit stress.   Stress II: Relaxation: -Discuss relaxation techniques to limit stress.   Oxygen for Home/Travel: - Discuss how to prepare for travel when on oxygen and proper ways to transport and store oxygen to ensure safety.   Knowledge Questionnaire Score:  Knowledge Questionnaire Score - 01/15/23 1319       Knowledge Questionnaire Score   Pre Score 9/18             Core Components/Risk Factors/Patient Goals at Admission:  Personal Goals and Risk Factors at Admission - 01/15/23 1320       Core Components/Risk Factors/Patient Goals on Admission    Weight Management Obesity;Weight Maintenance    Improve shortness of breath with ADL's Yes    Intervention Provide education, individualized exercise plan and daily activity instruction to help decrease symptoms of SOB with activities of daily living.    Expected Outcomes Short Term: Improve cardiorespiratory fitness to achieve a reduction of symptoms when performing ADLs;Long Term: Be able to perform more ADLs without symptoms or delay the onset of symptoms    Heart Failure Yes    Intervention Provide a combined exercise and nutrition program that is supplemented with education, support and counseling about heart failure. Directed toward relieving symptoms such as shortness of  breath, decreased exercise tolerance, and extremity edema.    Expected Outcomes Improve functional capacity of life    Hypertension Yes    Intervention Provide education on lifestyle modifcations including regular physical activity/exercise, weight management, moderate sodium restriction and increased consumption of fresh fruit, vegetables, and low fat dairy, alcohol moderation, and smoking cessation.;Monitor prescription use compliance.    Expected Outcomes Short Term: Continued assessment and intervention until BP is < 140/59mm HG in hypertensive participants. < 130/42mm HG in hypertensive participants with diabetes, heart failure or chronic kidney disease.;Long Term: Maintenance of blood pressure at goal levels.    Personal Goal Other Yes    Personal Goal Patient wants to breathe better and have more energy to complete his ADL's.    Intervention Patient will attend PR 2 days/week with exercise and education.    Expected Outcomes Patient will complete the program meeting both personal and program goals.             Core Components/Risk Factors/Patient Goals Review:   Goals and Risk Factor Review     Row Name 02/05/23 1440             Core Components/Risk Factors/Patient Goals Review   Personal Goals Review Develop more efficient breathing techniques such as purse lipped breathing and diaphragmatic breathing and practicing self-pacing with activity.;Improve shortness of breath with ADL's;Other       Review Patient is new to the prorgram completing first session.  We will  help him with develping efficient breathing techniques such as purse lipped breathing and pratice self pacing with increased activity.   Tolerates exercise with no supplemental oxygen needed at this time, during exercise session his O2 sats range 92%-96%.  Exercised on the treadmill and Nu-step without any problem or difficulty.  His personal goals for the program is he wants to breath better and have more energy to do  his ADL's.   We will continue to monitor his progress as he works toward meeting these goals.       Expected Outcomes Patient will complete the program meeting both program and personal goals.                Core Components/Risk Factors/Patient Goals at Discharge (Final Review):   Goals and Risk Factor Review - 02/05/23 1440       Core Components/Risk Factors/Patient Goals Review   Personal Goals Review Develop more efficient breathing techniques such as purse lipped breathing and diaphragmatic breathing and practicing self-pacing with activity.;Improve shortness of breath with ADL's;Other    Review Patient is new to the prorgram completing first session.  We will help him with develping efficient breathing techniques such as purse lipped breathing and pratice self pacing with increased activity.   Tolerates exercise with no supplemental oxygen needed at this time, during exercise session his O2 sats range 92%-96%.  Exercised on the treadmill and Nu-step without any problem or difficulty.  His personal goals for the program is he wants to breath better and have more energy to do his ADL's.   We will continue to monitor his progress as he works toward meeting these goals.    Expected Outcomes Patient will complete the program meeting both program and personal goals.             ITP Comments:  ITP Comments     Row Name 02/14/23 1600           ITP Comments 30 day review completed. ITP sent to Dr.Jehanzeb Memon, Medical Director of  Pulmonary Rehab. Continue with ITP unless changes are made by physician.                Comments: 30 day review

## 2023-02-15 ENCOUNTER — Encounter (HOSPITAL_COMMUNITY): Payer: Medicare HMO

## 2023-02-15 DIAGNOSIS — J432 Centrilobular emphysema: Secondary | ICD-10-CM | POA: Diagnosis not present

## 2023-02-15 DIAGNOSIS — R5381 Other malaise: Secondary | ICD-10-CM | POA: Diagnosis not present

## 2023-02-15 DIAGNOSIS — R0602 Shortness of breath: Secondary | ICD-10-CM | POA: Diagnosis not present

## 2023-02-15 NOTE — Progress Notes (Signed)
Daily Session Note  Patient Details  Name: Ruben Young MRN: [id number] Date of Birth: 1951/07/15 Referring Provider:   Flowsheet Row PULMONARY REHAB OTHER RESP ORIENTATION from 01/15/2023 in [facility] CARDIAC REHABILITATION  Referring Provider Dr. Vassie Loll       Encounter Date: 02/15/2023  Check In:  Session Check In - 02/15/23 1330       Check-In   Supervising physician immediately available to respond to emergencies [facility] MD immediately available    Physician(s) Dr Jenene Slicker    Location [facility]    Staff Present Ross Ludwig, BS, Exercise Physiologist;Hillary Troutman BSN, RN;Keddrick Wyne Daphine Deutscher, RN, BSN    Virtual Visit No    Medication changes reported     No    Fall or balance concerns reported    No    Tobacco Cessation No Change    Warm-up and Cool-down Performed on first and last piece of equipment    Resistance Training Performed Yes    VAD Patient? No      Pain Assessment   Currently in Pain? No/denies    Pain Score 0-No pain             Capillary Blood Glucose: No results found for this or any previous visit (from the past 24 hour(s)).    Social History   Tobacco Use  Smoking Status Former   Current packs/day: 0.00   Average packs/day: 1 pack/day for 57.8 years (57.8 ttl pk-yrs)   Types: Cigarettes   Start date: 1966   Quit date: 05/2022   Years since quitting: 0.7   Passive exposure: Past  Smokeless Tobacco Never    Goals Met:  Proper associated with RPD/PD & O2 Sat Independence with exercise equipment Using PLB without cueing & demonstrates good technique Exercise tolerated well Queuing for purse lip breathing No report of concerns or symptoms today Strength training completed today  Goals Unmet:  Not Applicable  Comments: Pt able to follow exercise prescription today without complaint.  Will continue to monitor for progression.    Dr. Erick Blinks is Medical Director for [facility] Pulmonary Rehab.

## 2023-02-19 NOTE — Telephone Encounter (Signed)
Ok to proceed with TCS. Holding Eliquis 48 hours prior. See previous orders given at time of OV.

## 2023-02-20 ENCOUNTER — Encounter (HOSPITAL_COMMUNITY): Payer: Medicare HMO

## 2023-02-20 DIAGNOSIS — J432 Centrilobular emphysema: Secondary | ICD-10-CM

## 2023-02-20 NOTE — Telephone Encounter (Addendum)
Spoke with pt. He has been scheduled for 8/19. Aware to hold his iron x 7 days prior, eliquis x 2 days prior. He already has prep at home. Will call back with pre-op appt. Will send new instructions.   PA approved via cohere. Authorization #[id number], DOS: 03/19/2023 - 05/19/2023

## 2023-02-20 NOTE — Progress Notes (Signed)
Daily Session Note  Patient Details  Name: Ruben Young MRN: [id number] Date of Birth: 1951-04-17 Referring Provider:   Flowsheet Row PULMONARY REHAB OTHER RESP ORIENTATION from 01/15/2023 in [facility] CARDIAC REHABILITATION  Referring Provider Dr. Vassie Loll       Encounter Date: 02/20/2023  Check In:  Session Check In - 02/20/23 1330       Check-In   Supervising physician immediately available to respond to emergencies See telemetry face sheet for immediately available MD    Location [facility]    Staff Present Ross Ludwig, BS, Exercise Physiologist;Daphyne Daphine Deutscher, RN, BSN    Virtual Visit No    Medication changes reported     No    Fall or balance concerns reported    No    Tobacco Cessation No Change    Warm-up and Cool-down Performed on first and last piece of equipment    Resistance Training Performed Yes    VAD Patient? No    PAD/SET Patient? No      Pain Assessment   Currently in Pain? No/denies    Pain Score 0-No pain    Multiple Pain Sites No             Capillary Blood Glucose: No results found for this or any previous visit (from the past 24 hour(s)).    Social History   Tobacco Use  Smoking Status Former   Current packs/day: 0.00   Average packs/day: 1 pack/day for 57.8 years (57.8 ttl pk-yrs)   Types: Cigarettes   Start date: 1966   Quit date: 05/2022   Years since quitting: 0.7   Passive exposure: Past  Smokeless Tobacco Never    Goals Met:  Independence with exercise equipment Exercise tolerated well No report of concerns or symptoms today Strength training completed today  Goals Unmet:  Not Applicable  Comments: Pt able to follow exercise prescription today without complaint.  Will continue to monitor for progression.    Dr. Erick Blinks is Medical Director for [facility] Pulmonary Rehab.

## 2023-02-21 ENCOUNTER — Encounter: Payer: Self-pay | Admitting: *Deleted

## 2023-02-22 ENCOUNTER — Other Ambulatory Visit: Payer: Self-pay | Admitting: Cardiology

## 2023-02-22 ENCOUNTER — Encounter (HOSPITAL_COMMUNITY)
Admission: RE | Admit: 2023-02-22 | Discharge: 2023-02-22 | Disposition: A | Discharge status: Home / Self Care | Payer: Medicare HMO | Source: Ambulatory Visit | Attending: Pulmonary Disease | Admitting: Pulmonary Disease

## 2023-02-22 DIAGNOSIS — J432 Centrilobular emphysema: Secondary | ICD-10-CM

## 2023-02-22 NOTE — Progress Notes (Signed)
Daily Session Note  Patient Details  Name: Ruben Young MRN: [id number] Date of Birth: [birth year] Referring Provider:   Flowsheet Row PULMONARY REHAB OTHER RESP ORIENTATION from 01/15/2023 in [facility] CARDIAC REHABILITATION  Referring Provider Dr. Vassie Loll       Encounter Date: 02/22/2023  Check In:  Session Check In - 02/22/23 1330       Check-In   Supervising physician immediately available to respond to emergencies See telemetry face sheet for immediately available MD    Physician(s) Branch    Location [facility]    Staff Present Ross Ludwig, BS, Exercise Physiologist;Debra Laural Benes, RN, BSN;Hillary Troutman BSN, RN;Daphyne Daphine Deutscher, RN, BSN    Virtual Visit No    Medication changes reported     No    Fall or balance concerns reported    No    Tobacco Cessation No Change    Warm-up and Cool-down Performed on first and last piece of equipment    Resistance Training Performed Yes    VAD Patient? No      Pain Assessment   Currently in Pain? No/denies    Pain Score 0-No pain    Multiple Pain Sites No             Capillary Blood Glucose: No results found for this or any previous visit (from the past 24 hour(s)).    Social History   Tobacco Use  Smoking Status Former   Current packs/day: 0.00   Average packs/day: 1 pack/day for 57.8 years (57.8 ttl pk-yrs)   Types: Cigarettes   Start date: 1966   Quit date: 05/2022   Years since quitting: 0.7   Passive exposure: Past  Smokeless Tobacco Never    Goals Met:  Independence with exercise equipment Exercise tolerated well No report of concerns or symptoms today Strength training completed today  Goals Unmet:  Not Applicable  Comments: Pt able to follow exercise prescription today without complaint.  Will continue to monitor for progression.    Dr. Erick Blinks is Medical Director for [facility] Pulmonary Rehab.

## 2023-02-23 ENCOUNTER — Telehealth: Payer: Self-pay | Admitting: *Deleted

## 2023-02-23 ENCOUNTER — Ambulatory Visit (HOSPITAL_COMMUNITY): Admission: RE | Admit: 2023-02-23 | Payer: Medicare HMO | Source: Ambulatory Visit

## 2023-02-23 NOTE — Telephone Encounter (Signed)
Pt left vm that he missed his appointment this morning because he over slept.  Advised pt that he missed his ultrasound this morning. Gave number to CS to call and reschedule. Pt verbalized understanding.

## 2023-02-26 DIAGNOSIS — I2699 Other pulmonary embolism without acute cor pulmonale: Secondary | ICD-10-CM | POA: Diagnosis not present

## 2023-02-26 DIAGNOSIS — J432 Centrilobular emphysema: Secondary | ICD-10-CM | POA: Diagnosis not present

## 2023-02-26 DIAGNOSIS — I722 Aneurysm of renal artery: Secondary | ICD-10-CM | POA: Diagnosis not present

## 2023-02-26 DIAGNOSIS — I10 Essential (primary) hypertension: Secondary | ICD-10-CM | POA: Diagnosis not present

## 2023-02-26 DIAGNOSIS — I2510 Atherosclerotic heart disease of native coronary artery without angina pectoris: Secondary | ICD-10-CM | POA: Diagnosis not present

## 2023-02-26 DIAGNOSIS — I723 Aneurysm of iliac artery: Secondary | ICD-10-CM | POA: Diagnosis not present

## 2023-02-26 DIAGNOSIS — R0609 Other forms of dyspnea: Secondary | ICD-10-CM | POA: Diagnosis not present

## 2023-02-26 DIAGNOSIS — Z6827 Body mass index (BMI) 27.0-27.9, adult: Secondary | ICD-10-CM | POA: Diagnosis not present

## 2023-02-26 DIAGNOSIS — I5022 Chronic systolic (congestive) heart failure: Secondary | ICD-10-CM | POA: Diagnosis not present

## 2023-02-27 ENCOUNTER — Encounter (HOSPITAL_COMMUNITY): Admission: RE | Admit: 2023-02-27 | Payer: Medicare HMO | Source: Ambulatory Visit

## 2023-02-27 DIAGNOSIS — J432 Centrilobular emphysema: Secondary | ICD-10-CM

## 2023-02-27 NOTE — Progress Notes (Signed)
Daily Session Note  Patient Details  Name: Ruben Young MRN: [id number] Date of Birth: 1951/04/30 Referring Provider:   Flowsheet Row PULMONARY REHAB OTHER RESP ORIENTATION from 01/15/2023 in [facility] CARDIAC REHABILITATION  Referring Provider Dr. Vassie Loll       Encounter Date: 02/27/2023  Check In:  Session Check In - 02/27/23 1330       Check-In   Supervising physician immediately available to respond to emergencies See telemetry face sheet for immediately available MD    Physician(s) Branch    Location [facility]    Staff Present Ross Ludwig, BS, Exercise Physiologist;Santana Gosdin, RN;Daphyne Daphine Deutscher, RN, Thomos Lemons, MA, RCEP, CCRP, CCET    Virtual Visit No    Medication changes reported     No    Fall or balance concerns reported    No    Tobacco Cessation No Change    Warm-up and Cool-down Performed on first and last piece of equipment    Resistance Training Performed Yes    VAD Patient? No    PAD/SET Patient? No      Pain Assessment   Currently in Pain? No/denies    Pain Score 0-No pain    Multiple Pain Sites No             Capillary Blood Glucose: No results found for this or any previous visit (from the past 24 hour(s)).    Social History   Tobacco Use  Smoking Status Former   Current packs/day: 0.00   Average packs/day: 1 pack/day for 57.8 years (57.8 ttl pk-yrs)   Types: Cigarettes   Start date: 1966   Quit date: 05/2022   Years since quitting: 0.7   Passive exposure: Past  Smokeless Tobacco Never    Goals Met:  Proper associated with RPD/PD & O2 Sat Independence with exercise equipment Exercise tolerated well No report of concerns or symptoms today Strength training completed today  Goals Unmet:  Not Applicable  Comments: Pt able to follow exercise prescription today without complaint.  Will continue to monitor for progression.    Dr. Erick Blinks is Medical Director for [facility] Pulmonary  Rehab.

## 2023-03-01 ENCOUNTER — Encounter (HOSPITAL_COMMUNITY)
Admission: RE | Admit: 2023-03-01 | Discharge: 2023-03-01 | Disposition: A | Discharge status: Home / Self Care | Payer: Medicare HMO | Source: Ambulatory Visit | Attending: Pulmonary Disease | Admitting: Pulmonary Disease

## 2023-03-01 DIAGNOSIS — J432 Centrilobular emphysema: Secondary | ICD-10-CM | POA: Diagnosis not present

## 2023-03-01 NOTE — Progress Notes (Signed)
Daily Session Note  Patient Details  Name: Ruben Young MRN: [id number] Date of Birth: Apr 04, 1951 Referring Provider:   Flowsheet Row PULMONARY REHAB OTHER RESP ORIENTATION from 01/15/2023 in [facility] CARDIAC REHABILITATION  Referring Provider Dr. Vassie Loll       Encounter Date: 03/01/2023  Check In:  Session Check In - 03/01/23 1330       Check-In   Supervising physician immediately available to respond to emergencies See telemetry face sheet for immediately available MD    Location [facility]    Staff Present Ross Ludwig, BS, Exercise Physiologist;Hillary Leonidas Romberg BSN, RN;Charlize Hathaway Daphine Deutscher, RN, BSN    Virtual Visit No    Medication changes reported     No    Fall or balance concerns reported    No    Tobacco Cessation No Change    Warm-up and Cool-down Performed on first and last piece of equipment    Resistance Training Performed Yes    VAD Patient? No      Pain Assessment   Currently in Pain? No/denies             Capillary Blood Glucose: No results found for this or any previous visit (from the past 24 hour(s)).    Social History   Tobacco Use  Smoking Status Former   Current packs/day: 0.00   Average packs/day: 1 pack/day for 57.8 years (57.8 ttl pk-yrs)   Types: Cigarettes   Start date: 1966   Quit date: 05/2022   Years since quitting: 0.7   Passive exposure: Past  Smokeless Tobacco Never    Goals Met:  Proper associated with RPD/PD & O2 Sat Independence with exercise equipment Using PLB without cueing & demonstrates good technique Exercise tolerated well Queuing for purse lip breathing No report of concerns or symptoms today Strength training completed today  Goals Unmet:  Not Applicable  Comments: Pt able to follow exercise prescription today without complaint.  Will continue to monitor for progression.    Dr. Erick Blinks is Medical Director for [facility] Pulmonary Rehab.

## 2023-03-06 ENCOUNTER — Encounter (HOSPITAL_COMMUNITY): Payer: Medicare HMO

## 2023-03-08 ENCOUNTER — Ambulatory Visit (HOSPITAL_COMMUNITY): Payer: Medicare HMO

## 2023-03-08 ENCOUNTER — Encounter (HOSPITAL_COMMUNITY)
Admission: RE | Admit: 2023-03-08 | Discharge: 2023-03-08 | Disposition: A | Discharge status: Home / Self Care | Payer: Medicare HMO | Source: Ambulatory Visit | Attending: Pulmonary Disease | Admitting: Pulmonary Disease

## 2023-03-08 DIAGNOSIS — J432 Centrilobular emphysema: Secondary | ICD-10-CM | POA: Diagnosis not present

## 2023-03-08 NOTE — Progress Notes (Signed)
Daily Session Note  Patient Details  Name: Ruben Young MRN: [id number] Date of Birth: June 10, 1951 Referring Provider:   Flowsheet Row PULMONARY REHAB OTHER RESP ORIENTATION from 01/15/2023 in [facility] CARDIAC REHABILITATION  Referring Provider Dr. Vassie Loll       Encounter Date: 03/08/2023  Check In:  Session Check In - 03/08/23 1310       Check-In   Supervising physician immediately available to respond to emergencies See telemetry face sheet for immediately available ER MD    Location [facility]    Staff Present Rodena Medin, RN, Pleas Koch, RN, BSN    Virtual Visit No    Medication changes reported     No    Fall or balance concerns reported    No    Tobacco Cessation No Change    Warm-up and Cool-down Performed on first and last piece of equipment    Resistance Training Performed Yes    VAD Patient? No    PAD/SET Patient? No      Pain Assessment   Currently in Pain? No/denies    Pain Score 0-No pain    Multiple Pain Sites No             Capillary Blood Glucose: No results found for this or any previous visit (from the past 24 hour(s)).    Social History   Tobacco Use  Smoking Status Former   Current packs/day: 0.00   Average packs/day: 1 pack/day for 57.8 years (57.8 ttl pk-yrs)   Types: Cigarettes   Start date: 1966   Quit date: 05/2022   Years since quitting: 0.7   Passive exposure: Past  Smokeless Tobacco Never    Goals Met:  Proper associated with RPD/PD & O2 Sat Independence with exercise equipment Using PLB without cueing & demonstrates good technique Exercise tolerated well No report of concerns or symptoms today Strength training completed today  Goals Unmet:  Not Applicable  Comments: Pt able to follow exercise prescription today without complaint.  Will continue to monitor for progression.    Dr. Erick Blinks is Medical Director for [facility] Pulmonary Rehab.

## 2023-03-13 ENCOUNTER — Encounter (HOSPITAL_COMMUNITY)
Admission: RE | Admit: 2023-03-13 | Discharge: 2023-03-13 | Disposition: A | Discharge status: Home / Self Care | Payer: Medicare HMO | Source: Ambulatory Visit | Attending: Pulmonary Disease | Admitting: Pulmonary Disease

## 2023-03-13 DIAGNOSIS — J432 Centrilobular emphysema: Secondary | ICD-10-CM

## 2023-03-13 NOTE — Progress Notes (Signed)
Daily Session Note  Patient Details  Name: Ruben Young MRN: [id number] Date of Birth: [birth year] Referring Provider:   Flowsheet Row PULMONARY REHAB OTHER RESP ORIENTATION from 01/15/2023 in [facility] CARDIAC REHABILITATION  Referring Provider Dr. Vassie Loll       Encounter Date: 03/13/2023  Check In:  Session Check In - 03/13/23 1330       Check-In   Supervising physician immediately available to respond to emergencies See telemetry face sheet for immediately available ER MD    Physician(s) Branch    Location [facility]    Staff Present Ross Ludwig, BS, Exercise Physiologist;Barron Vanloan, RN;Jessica [place], MA, RCEP, CCRP, Harolyn Rutherford, RN, BSN    Virtual Visit No    Medication changes reported     No    Fall or balance concerns reported    No    Tobacco Cessation No Change    Warm-up and Cool-down Performed on first and last piece of equipment    Resistance Training Performed Yes    VAD Patient? No    PAD/SET Patient? No      Pain Assessment   Currently in Pain? No/denies    Pain Score 0-No pain    Multiple Pain Sites No             Capillary Blood Glucose: No results found for this or any previous visit (from the past 24 hour(s)).    Social History   Tobacco Use  Smoking Status Former   Current packs/day: 0.00   Average packs/day: 1 pack/day for 57.8 years (57.8 ttl pk-yrs)   Types: Cigarettes   Start date: 1966   Quit date: 05/2022   Years since quitting: 0.7   Passive exposure: Past  Smokeless Tobacco Never    Goals Met:  Proper associated with RPD/PD & O2 Sat Independence with exercise equipment Using PLB without cueing & demonstrates good technique Exercise tolerated well No report of concerns or symptoms today Strength training completed today  Goals Unmet:  Not Applicable  Comments: Pt able to follow exercise prescription today without complaint.  Will continue to monitor for progression.    Dr.  Erick Blinks is Medical Director for [facility] Pulmonary Rehab.

## 2023-03-13 NOTE — Patient Instructions (Signed)
Ruben Young  03/13/2023     @PREFPERIOPPHARMACY @   Your procedure is scheduled on  03/19/2023.   Report to [facility] at  0845 A.M.   Call this number if you have problems the morning of surgery:  [phone]  If you experience any cold or flu symptoms such as cough, fever, chills, shortness of breath, etc. between now and your scheduled surgery, please notify us at the above number.   Remember:  Follow the diet and prep instructions given to you by the office.     Your last dose of iron should be on 03/11/2023.      Your last dose of eliquis should be on 03/16/2023.     Take these medicines the morning of surgery with A SIP OF WATER                                   amlodipine, carvedilol.     Do not wear jewelry, make-up or nail polish, including gel polish,  artificial nails, or any other type of covering on natural nails (fingers and  toes).  Do not wear lotions, powders, or perfumes, or deodorant.  Do not shave 48 hours prior to surgery.  Men may shave face and neck.  Do not bring valuables to the hospital.  [facility] is not responsible for any belongings or valuables.  Contacts, dentures or bridgework may not be worn into surgery.  Leave your suitcase in the car.  After surgery it may be brought to your room.  For patients admitted to the hospital, discharge time will be determined by your treatment team.  Patients discharged the day of surgery will not be allowed to drive home and must have someone with them for 24 hours.    Special instructions:   DO NOT smoke tobacco or vape for 24 hours before your procedure.  Please read over the following fact sheets that you were given. Anesthesia Post-op Instructions and Care and Recovery After Surgery      Colonoscopy, Adult, Care After The following information offers guidance on how to care for yourself after your procedure. Your health care provider may also give you more specific instructions. If  you have problems or questions, contact your health care provider. What can I expect after the procedure? After the procedure, it is common to have: A small amount of blood in your stool for 24 hours after the procedure. Some gas. Mild cramping or bloating of your abdomen. Follow these instructions at home: Eating and drinking  Drink enough fluid to keep your urine pale yellow. Follow instructions from your health care provider about eating or drinking restrictions. Resume your normal diet as told by your health care provider. Avoid heavy or fried foods that are hard to digest. Activity Rest as told by your health care provider. Avoid sitting for a long time without moving. Get up to take short walks every 1-2 hours. This is important to improve blood flow and breathing. Ask for help if you feel weak or unsteady. Return to your normal activities as told by your health care provider. Ask your health care provider what activities are safe for you. Managing cramping and bloating  Try walking around when you have cramps or feel bloated. If directed, apply heat to your abdomen as told by your health care provider. Use the heat source that your health care provider recommends,  such as a moist heat pack or a heating pad. Place a towel between your skin and the heat source. Leave the heat on for 20-30 minutes. Remove the heat if your skin turns bright red. This is especially important if you are unable to feel pain, heat, or cold. You have a greater risk of getting burned. General instructions If you were given a sedative during the procedure, it can affect you for several hours. Do not drive or operate machinery until your health care provider says that it is safe. For the first 24 hours after the procedure: Do not sign important documents. Do not drink alcohol. Do your regular daily activities at a slower pace than normal. Eat soft foods that are easy to digest. Take over-the-counter and  prescription medicines only as told by your health care provider. Keep all follow-up visits. This is important. Contact a health care provider if: You have blood in your stool 2-3 days after the procedure. Get help right away if: You have more than a small spotting of blood in your stool. You have large blood clots in your stool. You have swelling of your abdomen. You have nausea or vomiting. You have a fever. You have increasing pain in your abdomen that is not relieved with medicine. These symptoms may be an emergency. Get help right away. Call 911. Do not wait to see if the symptoms will go away. Do not drive yourself to the hospital. Summary After the procedure, it is common to have a small amount of blood in your stool. You may also have mild cramping and bloating of your abdomen. If you were given a sedative during the procedure, it can affect you for several hours. Do not drive or operate machinery until your health care provider says that it is safe. Get help right away if you have a lot of blood in your stool, nausea or vomiting, a fever, or increased pain in your abdomen. This information is not intended to replace advice given to you by your health care provider. Make sure you discuss any questions you have with your health care provider. Document Revised: 08/29/2022 Document Reviewed: 03/09/2021 Elsevier Patient Education  2024 [employer] Monitored Anesthesia Care, Care After The following information offers guidance on how to care for yourself after your procedure. Your health care provider may also give you more specific instructions. If you have problems or questions, contact your health care provider. What can I expect after the procedure? After the procedure, it is common to have: Tiredness. Little or no memory about what happened during or after the procedure. Impaired judgment when it comes to making decisions. Nausea or vomiting. Some trouble with balance. Follow  these instructions at home: For the time period you were told by your health care provider:  Rest. Do not participate in activities where you could fall or become injured. Do not drive or use machinery. Do not drink alcohol. Do not take sleeping pills or medicines that cause drowsiness. Do not make important decisions or sign legal documents. Do not take care of children on your own. Medicines Take over-the-counter and prescription medicines only as told by your health care provider. If you were prescribed antibiotics, take them as told by your health care provider. Do not stop using the antibiotic even if you start to feel better. Eating and drinking Follow instructions from your health care provider about what you may eat and drink. Drink enough fluid to keep your urine pale yellow. If you vomit:  Drink clear fluids slowly and in small amounts as you are able. Clear fluids include water, ice chips, low-calorie sports drinks, and fruit juice that has water added to it (diluted fruit juice). Eat light and bland foods in small amounts as you are able. These foods include bananas, applesauce, rice, lean meats, toast, and crackers. General instructions  Have a responsible adult stay with you for the time you are told. It is important to have someone help care for you until you are awake and alert. If you have sleep apnea, surgery and some medicines can increase your risk for breathing problems. Follow instructions from your health care provider about wearing your sleep device: When you are sleeping. This includes during daytime naps. While taking prescription pain medicines, sleeping medicines, or medicines that make you drowsy. Do not use any products that contain nicotine or tobacco. These products include cigarettes, chewing tobacco, and vaping devices, such as e-cigarettes. If you need help quitting, ask your health care provider. Contact a health care provider if: You feel nauseous or  vomit every time you eat or drink. You feel light-headed. You are still sleepy or having trouble with balance after 24 hours. You get a rash. You have a fever. You have redness or swelling around the IV site. Get help right away if: You have trouble breathing. You have new confusion after you get home. These symptoms may be an emergency. Get help right away. Call 911. Do not wait to see if the symptoms will go away. Do not drive yourself to the hospital. This information is not intended to replace advice given to you by your health care provider. Make sure you discuss any questions you have with your health care provider. Document Revised: 12/12/2021 Document Reviewed: 12/12/2021 Elsevier Patient Education  2024 [employer].

## 2023-03-14 ENCOUNTER — Encounter (HOSPITAL_COMMUNITY)
Admission: RE | Admit: 2023-03-14 | Discharge: 2023-03-14 | Disposition: A | Discharge status: Home / Self Care | Payer: Medicare HMO | Source: Ambulatory Visit | Attending: Internal Medicine | Admitting: Internal Medicine

## 2023-03-14 ENCOUNTER — Encounter (HOSPITAL_COMMUNITY): Payer: Self-pay

## 2023-03-14 ENCOUNTER — Encounter (HOSPITAL_COMMUNITY): Payer: Self-pay | Admitting: *Deleted

## 2023-03-14 ENCOUNTER — Other Ambulatory Visit: Payer: Self-pay

## 2023-03-14 VITALS — BP 127/72 | HR 54 | Temp 97.8°F | Resp 18 | Ht 69.0 in | Wt 181.4 lb

## 2023-03-14 DIAGNOSIS — Z01812 Encounter for preprocedural laboratory examination: Secondary | ICD-10-CM | POA: Diagnosis not present

## 2023-03-14 DIAGNOSIS — J432 Centrilobular emphysema: Secondary | ICD-10-CM

## 2023-03-14 DIAGNOSIS — Z79899 Other long term (current) drug therapy: Secondary | ICD-10-CM | POA: Insufficient documentation

## 2023-03-14 HISTORY — DX: Dyspnea, unspecified: R06.00

## 2023-03-14 LAB — BASIC METABOLIC PANEL
Anion gap: 6 (ref 5–15)
BUN: 9 mg/dL (ref 8–23)
CO2: 25 mmol/L (ref 22–32)
Calcium: 8.7 mg/dL — ABNORMAL LOW (ref 8.9–10.3)
Chloride: 106 mmol/L (ref 98–111)
Creatinine, Ser: 0.91 mg/dL (ref 0.61–1.24)
GFR, Estimated: >60 mL/min (ref >60)
Glucose, Bld: 92 mg/dL (ref 70–99)
Potassium: 3.5 mmol/L (ref 3.5–5.1)
Sodium: 137 mmol/L (ref 135–145)

## 2023-03-14 NOTE — Progress Notes (Signed)
Pulmonary Individual Treatment Plan  Patient Details  Name: Ruben Young MRN: [id number] Date of Birth: 04-03-1951 Referring Provider:   Flowsheet Row PULMONARY REHAB OTHER RESP ORIENTATION from 01/15/2023 in [facility] CARDIAC REHABILITATION  Referring Provider Dr. Vassie Loll       Initial Encounter Date:  Flowsheet Row PULMONARY REHAB OTHER RESP ORIENTATION from 01/15/2023 in [facility] CARDIAC REHABILITATION  Date 01/15/23       Visit Diagnosis: Centrilobular emphysema (HCC)  Patient's Home Medications on Admission:   Current Outpatient Medications:    albuterol (VENTOLIN HFA) 108 (90 Base) MCG/ACT inhaler, Inhale 2 puffs into the lungs every 4 (four) hours as needed for wheezing or shortness of breath., Disp: 6.7 g, Rfl: 1   amLODipine (NORVASC) 10 MG tablet, TAKE 1 TABLET(10 MG) BY MOUTH DAILY, Disp: 90 tablet, Rfl: 3   apixaban (ELIQUIS) 5 MG TABS tablet, TAKE 1 TABLET(5 MG) BY MOUTH TWICE DAILY, Disp: 60 tablet, Rfl: 2   Ascorbic Acid (VITAMIN C PO), Take 1,000 mg by mouth every other day., Disp: , Rfl:    aspirin EC 81 MG tablet, Take 81 mg by mouth daily., Disp: , Rfl:    atorvastatin (LIPITOR) 40 MG tablet, TAKE 1 TABLET BY MOUTH DAILY AT 6 PM, Disp: 90 tablet, Rfl: 2   B Complex Vitamins (B COMPLEX PO), Take 1 capsule by mouth every other day., Disp: , Rfl:    CALCIUM PO, Take 1 tablet by mouth every other day., Disp: , Rfl:    carvedilol (COREG) 6.25 MG tablet, TAKE 1 TABLET(6.25 MG) BY MOUTH TWICE DAILY WITH A MEAL, Disp: 180 tablet, Rfl: 2   Cholecalciferol (VITAMIN D3) 25 MCG (1000 UT) CAPS, Take 1,000 Units by mouth daily. , Disp: , Rfl:    ferrous sulfate 325 (65 FE) MG EC tablet, Take 325 mg by mouth every other day., Disp: , Rfl:    GINSENG PO, Take 1 capsule by mouth every other day., Disp: , Rfl:    hydrochlorothiazide (HYDRODIURIL) 25 MG tablet, Take 25 mg by mouth daily., Disp: , Rfl:    hydrocortisone cream 1 %, Apply 1 Application topically daily as needed  for itching., Disp: , Rfl:    Multiple Vitamin (MULTIVITAMIN WITH MINERALS) TABS tablet, Take 1 tablet by mouth daily., Disp: , Rfl:    nitroGLYCERIN (NITROSTAT) 0.4 MG SL tablet, Place 1 tablet (0.4 mg total) under the tongue every 5 (five) minutes x 3 doses as needed for chest pain. For chest pains, Disp: 25 tablet, Rfl: 0   Omega-3 Fatty Acids (FISH OIL) 1000 MG CAPS, Take 1,000 mg by mouth every other day., Disp: , Rfl:    POTASSIUM PO, Take 1 tablet by mouth every other day., Disp: , Rfl:    vitamin E 400 UNIT capsule, Take 400 Units by mouth every other day., Disp: , Rfl:   Past Medical History: Past Medical History:  Diagnosis Date   AAA (abdominal aortic aneurysm) (HCC)    DDD (degenerative disc disease), lumbar    DVT (deep venous thrombosis) (HCC)    Dyspnea    Hyperlipidemia    Hypertension    MI (myocardial infarction) (HCC)    2018   Old myocardial infarction    Pulmonary emboli (HCC)    Tobacco use disorder     Tobacco Use: Social History   Tobacco Use  Smoking Status Former   Current packs/day: 0.00   Average packs/day: 1 pack/day for 57.8 years (57.8 ttl pk-yrs)   Types: Cigarettes  Start date: 1966   Quit date: 05/2022   Years since quitting: 0.7   Passive exposure: Past  Smokeless Tobacco Never    Labs: Review Flowsheet  More data exists      Latest Ref Rng & Units 12/15/2011 09/24/2012 09/25/2012 04/14/2014 06/30/2022  Labs for ITP Cardiac and Pulmonary Rehab  Cholestrol 0 - 200 mg/dL - - 500  938  -  LDL (calc) 0 - 99 mg/dL - - 81  63  -  HDL-C >18.29 mg/dL - - 34  93.71  -  Trlycerides 0.0 - 149.0 mg/dL - - 91  69.6  -  PH, Arterial 7.35 - 7.45 - - - - 7.5   PCO2 arterial 32 - 48 mmHg - - - - 38   Bicarbonate 20.0 - 28.0 mmol/L - - - - 29.7   TCO2 0 - 100 mmol/L 28  24  - - -  O2 Saturation % - - - - 84.7     Details            Capillary Blood Glucose: No results found for: "GLUCAP"   Pulmonary Assessment Scores:  Pulmonary Assessment  Scores     Row Name 01/15/23 1316         ADL UCSD   ADL Phase Entry     SOB Score total 40     Rest 1     Walk 1     Stairs 3     Bath 2     Dress 2     Shop 3       CAT Score   CAT Score 21       mMRC Score   mMRC Score 3             UCSD: Self-administered rating of dyspnea associated with activities of daily living (ADLs) 6-point scale (0 = "not at all" to 5 = "maximal or unable to do because of breathlessness")  Scoring Scores range from 0 to 120.  Minimally important difference is 5 units  CAT: CAT can identify the health impairment of COPD patients and is better correlated with disease progression.  CAT has a scoring range of zero to 40. The CAT score is classified into four groups of low (less than 10), medium (10 - 20), high (21-30) and very high (31-40) based on the impact level of disease on health status. A CAT score over 10 suggests significant symptoms.  A worsening CAT score could be explained by an exacerbation, poor medication adherence, poor inhaler technique, or progression of COPD or comorbid conditions.  CAT MCID is 2 points  mMRC: mMRC (Modified Medical Research Council) Dyspnea Scale is used to assess the degree of baseline functional disability in patients of respiratory disease due to dyspnea. No minimal important difference is established. A decrease in score of 1 point or greater is considered a positive change.   Pulmonary Function Assessment:   Exercise Target Goals: Exercise Program Goal: Individual exercise prescription set using results from initial 6 min walk test and THRR while considering  patient's activity barriers and safety.   Exercise Prescription Goal: Initial exercise prescription builds to 30-45 minutes a day of aerobic activity, 2-3 days per week.  Home exercise guidelines will be given to patient during program as part of exercise prescription that the participant will acknowledge.  Activity Barriers & Risk  Stratification:  Activity Barriers & Cardiac Risk Stratification - 01/15/23 1243       Activity Barriers & Cardiac  Risk Stratification   Activity Barriers Arthritis;Back Problems;Deconditioning;Shortness of Breath    Cardiac Risk Stratification High             6 Minute Walk:  6 Minute Walk     Row Name 01/15/23 1337         6 Minute Walk   Phase Initial     Distance 1200 feet     Walk Time 6 minutes     # of Rest Breaks 0     MPH 2.27     METS 2.61     RPE 12     Perceived Dyspnea  13     VO2 Peak 9.15     Symptoms No     Resting HR 73 bpm     Resting BP 112/50     Resting Oxygen Saturation  96 %     Exercise Oxygen Saturation  during 6 min walk 90 %     Max Ex. HR 92 bpm     Max Ex. BP 136/60     2 Minute Post BP 118/60       Interval HR   1 Minute HR 80     2 Minute HR 81     3 Minute HR 87     4 Minute HR 92     5 Minute HR 90     6 Minute HR 92     2 Minute Post HR 76     Interval Heart Rate? Yes       Interval Oxygen   Interval Oxygen? Yes     Baseline Oxygen Saturation % 96 %     1 Minute Oxygen Saturation % 92 %     1 Minute Liters of Oxygen 0 L     2 Minute Oxygen Saturation % 92 %     2 Minute Liters of Oxygen 0 L     3 Minute Oxygen Saturation % 90 %     3 Minute Liters of Oxygen 0 L     4 Minute Oxygen Saturation % 91 %     4 Minute Liters of Oxygen 0 L     5 Minute Oxygen Saturation % 92 %     5 Minute Liters of Oxygen 0 L     6 Minute Oxygen Saturation % 93 %     6 Minute Liters of Oxygen 0 L     2 Minute Post Oxygen Saturation % 96 %     2 Minute Post Liters of Oxygen 0 L              Oxygen Initial Assessment:  Oxygen Initial Assessment - 01/15/23 1316       Initial 6 min Walk   Oxygen Used None             Oxygen Re-Evaluation:  Oxygen Re-Evaluation     Row Name 01/15/23 1316 03/01/23 1413 03/02/23 1235         Program Oxygen Prescription   Program Oxygen Prescription -- None --       Home Oxygen   Home  Oxygen Device -- None --     Sleep Oxygen Prescription -- None --     Home Exercise Oxygen Prescription -- None --     Home Resting Oxygen Prescription -- None --       Goals/Expected Outcomes   Short Term Goals -- To learn and understand importance of monitoring SPO2 with pulse oximeter and demonstrate accurate  use of the pulse oximeter.;To learn and understand importance of maintaining oxygen saturations>88%;To learn and demonstrate proper pursed lip breathing techniques or other breathing techniques. ;To learn and demonstrate proper use of respiratory medications To learn and demonstrate proper pursed lip breathing techniques or other breathing techniques.      Long  Term Goals -- Verbalizes importance of monitoring SPO2 with pulse oximeter and return demonstration;Maintenance of O2 saturations>88%;Exhibits proper breathing techniques, such as pursed lip breathing or other method taught during program session;Compliance with respiratory medication Exhibits proper breathing techniques, such as pursed lip breathing or other method taught during program session     Comments -- Pt has no home 02 or CPAP.  He uses his inhaler as needed.  Class education today on importance of PLB. Diaphragmatic and PLB breathing explained and performed with patient. Patient has a better understanding of how to do these exercises to help with breathing performance and relaxation. Patient performed breathing techniques adequately and to practice further at home.     Goals/Expected Outcomes -- Short term:  Pt will become more proficient with PLB   Long term:  Pt will utilize PLB in everyday life Short: practice PLB and diaphragmatic breathing at home. Long: Use PLB and diaphragmatic breathing independently              Oxygen Discharge (Final Oxygen Re-Evaluation):  Oxygen Re-Evaluation - 03/02/23 1235       Goals/Expected Outcomes   Short Term Goals To learn and demonstrate proper pursed lip breathing techniques or  other breathing techniques.     Long  Term Goals Exhibits proper breathing techniques, such as pursed lip breathing or other method taught during program session    Comments Diaphragmatic and PLB breathing explained and performed with patient. Patient has a better understanding of how to do these exercises to help with breathing performance and relaxation. Patient performed breathing techniques adequately and to practice further at home.    Goals/Expected Outcomes Short: practice PLB and diaphragmatic breathing at home. Long: Use PLB and diaphragmatic breathing independently             Initial Exercise Prescription:  Initial Exercise Prescription - 01/15/23 1300       Date of Initial Exercise RX and Referring Provider   Date 01/15/23    Referring Provider Dr. Vassie Loll    Expected Discharge Date 05/22/23      Treadmill   MPH 2    Grade 0    Minutes 17      NuStep   Level 1    SPM 60    Minutes 22      Prescription Details   Frequency (times per week) 2    Duration Progress to 30 minutes of continuous aerobic without signs/symptoms of physical distress      Intensity   THRR 40-80% of Max Heartrate 59.118    Ratings of Perceived Exertion 11-13    Perceived Dyspnea 0-4      Resistance Training   Training Prescription Yes    Weight 3    Reps 10-15             Perform Capillary Blood Glucose checks as needed.  Exercise Prescription Changes:   Exercise Prescription Changes     Row Name 01/30/23 1400 02/27/23 1400           Response to Exercise   Blood Pressure (Admit) 116/60 138/76      Blood Pressure (Exercise) 130/60 130/60      Blood  Pressure (Exit) 126/64 106/60      Heart Rate (Admit) 69 bpm 68 bpm      Heart Rate (Exercise) 106 bpm 89 bpm      Heart Rate (Exit) 75 bpm 76 bpm      Oxygen Saturation (Admit) 94 % 94 %      Oxygen Saturation (Exercise) 92 % 93 %      Oxygen Saturation (Exit) 97 % 97 %      Rating of Perceived Exertion (Exercise) 12 12       Perceived Dyspnea (Exercise) 12 2      Duration Continue with 30 min of aerobic exercise without signs/symptoms of physical distress. Continue with 30 min of aerobic exercise without signs/symptoms of physical distress.      Intensity THRR unchanged THRR unchanged        Progression   Progression Continue to progress workloads to maintain intensity without signs/symptoms of physical distress. Continue to progress workloads to maintain intensity without signs/symptoms of physical distress.        Resistance Training   Training Prescription Yes Yes      Weight 3 4      Reps 10-15 10-15      Time 10 Minutes --        Treadmill   MPH 1.7 1.6      Grade 0 0      Minutes 17 15      METs 2.3 2.23        NuStep   Level 1 1      SPM 59 73      Minutes 22 15      METs 1.8 1.8        Oxygen   Maintain Oxygen Saturation -- 88% or higher               Exercise Comments:   Exercise Goals and Review:   Exercise Goals     Row Name 01/15/23 1340             Exercise Goals   Increase Physical Activity Yes       Intervention Provide advice, education, support and counseling about physical activity/exercise needs.;Develop an individualized exercise prescription for aerobic and resistive training based on initial evaluation findings, risk stratification, comorbidities and participant's personal goals.       Expected Outcomes Short Term: Attend rehab on a regular basis to increase amount of physical activity.;Long Term: Add in home exercise to make exercise part of routine and to increase amount of physical activity.;Long Term: Exercising regularly at least 3-5 days a week.       Increase Strength and Stamina Yes       Intervention Provide advice, education, support and counseling about physical activity/exercise needs.;Develop an individualized exercise prescription for aerobic and resistive training based on initial evaluation findings, risk stratification, comorbidities and  participant's personal goals.       Expected Outcomes Short Term: Increase workloads from initial exercise prescription for resistance, speed, and METs.;Long Term: Improve cardiorespiratory fitness, muscular endurance and strength as measured by increased METs and functional capacity ( );Short Term: Perform resistance training exercises routinely during rehab and add in resistance training at home       Able to understand and use rate of perceived exertion (RPE) scale Yes       Intervention Provide education and explanation on how to use RPE scale       Expected Outcomes Short Term: Able to use RPE daily in  rehab to express subjective intensity level;Long Term:  Able to use RPE to guide intensity level when exercising independently       Able to understand and use Dyspnea scale Yes       Intervention Provide education and explanation on how to use Dyspnea scale       Expected Outcomes Short Term: Able to use Dyspnea scale daily in rehab to express subjective sense of shortness of breath during exertion;Long Term: Able to use Dyspnea scale to guide intensity level when exercising independently       Knowledge and understanding of Target Heart Rate Range (THRR) Yes       Intervention Provide education and explanation of THRR including how the numbers were predicted and where they are located for reference       Expected Outcomes Short Term: Able to state/look up THRR;Long Term: Able to use THRR to govern intensity when exercising independently;Short Term: Able to use daily as guideline for intensity in rehab       Understanding of Exercise Prescription Yes       Intervention Provide education, explanation, and written materials on patient's individual exercise prescription       Expected Outcomes Short Term: Able to explain program exercise prescription;Long Term: Able to explain home exercise prescription to exercise independently                Exercise Goals Re-Evaluation :  Exercise Goals  Re-Evaluation     Row Name 02/28/23 1307 03/01/23 1347           Exercise Goal Re-Evaluation   Exercise Goals Review -- Increase Physical Activity;Increase Strength and Stamina;Understanding of Exercise Prescription      Comments Pt has not increased his levles since starting rehab. He continues to exercise at an RPE of 12-13. Will continue to monitor and progress as able, Pt has not been feeling any stronger since starting rehab.  Pt has increased his speed on the treadmill and states that he feels ok, maybe just a little more out of breath.  He is not currently do any exercise at home.  We will continue to monitor his progress.      Expected Outcomes -- Short term:  Go over home exercise routine  Long term:  Start a home exercise program               Discharge Exercise Prescription (Final Exercise Prescription Changes):  Exercise Prescription Changes - 02/27/23 1400       Response to Exercise   Blood Pressure (Admit) 138/76    Blood Pressure (Exercise) 130/60    Blood Pressure (Exit) 106/60    Heart Rate (Admit) 68 bpm    Heart Rate (Exercise) 89 bpm    Heart Rate (Exit) 76 bpm    Oxygen Saturation (Admit) 94 %    Oxygen Saturation (Exercise) 93 %    Oxygen Saturation (Exit) 97 %    Rating of Perceived Exertion (Exercise) 12    Perceived Dyspnea (Exercise) 2    Duration Continue with 30 min of aerobic exercise without signs/symptoms of physical distress.    Intensity THRR unchanged      Progression   Progression Continue to progress workloads to maintain intensity without signs/symptoms of physical distress.      Resistance Training   Training Prescription Yes    Weight 4    Reps 10-15      Treadmill   MPH 1.6    Grade 0    Minutes  15    METs 2.23      NuStep   Level 1    SPM 73    Minutes 15    METs 1.8      Oxygen   Maintain Oxygen Saturation 88% or higher             Nutrition:  Target Goals: Understanding of nutrition guidelines, daily intake  of sodium 1500mg , cholesterol 200mg , calories 30% from fat and 7% or less from saturated fats, daily to have 5 or more servings of fruits and vegetables.  Biometrics:  Pre Biometrics - 01/15/23 1340       Pre Biometrics   Height 5\' 9"  (1.753 m)    Weight 179 lb 7.3 oz (81.4 kg)    Waist Circumference 42 inches    Hip Circumference 39 inches    Waist to Hip Ratio 1.08 %    BMI (Calculated) 26.49    Triceps Skinfold 15 mm    % Body Fat 28.2 %    Grip Strength 27.7 kg    Flexibility 0 in    Single Leg Stand 0 seconds              Nutrition Therapy Plan and Nutrition Goals:  Nutrition Therapy & Goals - 01/15/23 1318       Nutrition Therapy   RD appointment deferred Yes      Personal Nutrition Goals   Comments Patient's diet assessment score was 109. He says he cooks his own meals. Handout provided and explained regarding healthier choices and referral to RD offered. Patient declined. He says he is happy with his diet. We will continue to monitor.      Intervention Plan   Intervention Nutrition handout(s) given to patient.    Expected Outcomes Short Term Goal: Understand basic principles of dietary content, such as calories, fat, sodium, cholesterol and nutrients.             Nutrition Assessments:  Nutrition Assessments - 01/15/23 1318       MEDFICTS Scores   Pre Score 109            MEDIFICTS Score Key: ?70 Need to make dietary changes  40-70 Heart Healthy Diet ? 40 Therapeutic Level Cholesterol Diet   Picture Your Plate Scores: <96 Unhealthy dietary pattern with much room for improvement. 41-50 Dietary pattern unlikely to meet recommendations for good health and room for improvement. 51-60 More healthful dietary pattern, with some room for improvement.  >60 Healthy dietary pattern, although there may be some specific behaviors that could be improved.    Nutrition Goals Re-Evaluation:  Nutrition Goals Re-Evaluation     Row Name 03/01/23 1400              Goals   Nutrition Goal Heart healthy diet with adequate protein       Comment Pt eats 2 meals a day-breakfast and supper.  He does eat some red meats but he also likes chicken and fish (fried fish).  He states that he does not eat a lot of salty foods.  He does eat fruits and vegetables but not that often.       Expected Outcome Short term:  Adding more fruits/veggies to his diet    Long term:  Continue to work on healthy eating habits such as eating less fried foods                Nutrition Goals Discharge (Final Nutrition Goals Re-Evaluation):  Nutrition Goals Re-Evaluation -  03/01/23 1400       Goals   Nutrition Goal Heart healthy diet with adequate protein    Comment Pt eats 2 meals a day-breakfast and supper.  He does eat some red meats but he also likes chicken and fish (fried fish).  He states that he does not eat a lot of salty foods.  He does eat fruits and vegetables but not that often.    Expected Outcome Short term:  Adding more fruits/veggies to his diet    Long term:  Continue to work on healthy eating habits such as eating less fried foods             Psychosocial: Target Goals: Acknowledge presence or absence of significant depression and/or stress, maximize coping skills, provide positive support system. Participant is able to verbalize types and ability to use techniques and skills needed for reducing stress and depression.  Initial Review & Psychosocial Screening:  Initial Psych Review & Screening - 01/15/23 1322       Initial Review   Current issues with Current Sleep Concerns      Family Dynamics   Good Support System? Yes      Barriers   Psychosocial barriers to participate in program There are no identifiable barriers or psychosocial needs.;The patient should benefit from training in stress management and relaxation.      Screening Interventions   Interventions To provide support and resources with identified psychosocial needs;Provide  feedback about the scores to participant;Encouraged to exercise    Expected Outcomes Short Term goal: Identification and review with participant of any Quality of Life or Depression concerns found by scoring the questionnaire.;Short Term goal: Utilizing psychosocial counselor, staff and physician to assist with identification of specific Stressors or current issues interfering with healing process. Setting desired goal for each stressor or current issue identified.             Quality of Life Scores:  Quality of Life - 01/15/23 1341       Quality of Life   Select Quality of Life      Quality of Life Scores   Health/Function Pre 18.54 %    Socioeconomic Pre 20.67 %    Psych/Spiritual Pre 21.07 %    Family Pre 21 %    GLOBAL Pre 19.71 %            Scores of 19 and below usually indicate a poorer quality of life in these areas.  A difference of  2-3 points is a clinically meaningful difference.  A difference of 2-3 points in the total score of the Quality of Life Index has been associated with significant improvement in overall quality of life, self-image, physical symptoms, and general health in studies assessing change in quality of life.   PHQ-9: Review Flowsheet       01/15/2023  Depression screen PHQ 2/9  Decreased Interest 3  Down, Depressed, Hopeless 0  PHQ - 2 Score 3  Altered sleeping 1  Tired, decreased energy 3  Change in appetite 0  Feeling bad or failure about yourself  0  Trouble concentrating 0  Moving slowly or fidgety/restless 0  Suicidal thoughts 0  PHQ-9 Score 7  Difficult doing work/chores Not difficult at all    Details           Interpretation of Total Score  Total Score Depression Severity:  1-4 = Minimal depression, 5-9 = Mild depression, 10-14 = Moderate depression, 15-19 = Moderately severe depression, 20-27 =  Severe depression   Psychosocial Evaluation and Intervention:  Psychosocial Evaluation - 01/15/23 1323       Psychosocial  Evaluation & Interventions   Interventions Stress management education;Relaxation education;Encouraged to exercise with the program and follow exercise prescription    Comments Patient has no psychosocial barriers identified at his orientation visit. His initial PHQ-9 score was 7 due to him not having any energy to do anything and his SOB. He denies any depression or anxiety. He does say he has trouble staying asleep sometimes but does not take anything. He says this is not a problem. He says he has support from his brother-in-law and some friends. He does have 3 children but says he does not talk to them often. He lives alone and uses [employer] for his transportation. He says he plans to set this up himself. He is ready to start the program hoping to gain some energy and have less SOB.    Expected Outcomes Patient will continue to have no psychosocial barriers identified.    Continue Psychosocial Services  No Follow up required             Psychosocial Re-Evaluation:  Psychosocial Re-Evaluation     Row Name 02/05/23 1422 03/01/23 1352           Psychosocial Re-Evaluation   Current issues with None Identified Current Sleep Concerns      Comments Patient is new to program starting his first session.  His intial QOL score is 19.71%  and his PHQ 9 score is 7 .  Patient  was referred to PR by Dr.Rakesh with diagnosis of centrilobular emphysema.  He seems to enjoy coming to the program and  interactive with class and staff, also very talkeative and friendly.   Patient has no psychosocial issues noted at this time.  He demonstrates an interest in improving his breathing health. Pt still has sleep concerns, since he wakes up multiple times during the night and may be up for an hour or so.  This is a chonic problem but he does not want to take any sleep aids.      Expected Outcomes Patient will  have no psychosocial issues identified at dischage. Short term:  Pt will try to get 6 hours of sleep a day           Long term:  Pt will have no identifiable psychosocial barriers      Interventions Stress management education;Relaxation education;Encouraged to attend Pulmonary Rehabilitation for the exercise Stress management education;Relaxation education;Encouraged to attend Pulmonary Rehabilitation for the exercise      Continue Psychosocial Services  No Follow up required Follow up required by staff               Psychosocial Discharge (Final Psychosocial Re-Evaluation):  Psychosocial Re-Evaluation - 03/01/23 1352       Psychosocial Re-Evaluation   Current issues with Current Sleep Concerns    Comments Pt still has sleep concerns, since he wakes up multiple times during the night and may be up for an hour or so.  This is a chonic problem but he does not want to take any sleep aids.    Expected Outcomes Short term:  Pt will try to get 6 hours of sleep a day          Long term:  Pt will have no identifiable psychosocial barriers    Interventions Stress management education;Relaxation education;Encouraged to attend Pulmonary Rehabilitation for the exercise    Continue Psychosocial Services  Follow up required by staff              Education: Education Goals: Education classes will be provided on a weekly basis, covering required topics. Participant will state understanding/return demonstration of topics presented.  Learning Barriers/Preferences:  Learning Barriers/Preferences - 01/15/23 1319       Learning Barriers/Preferences   Learning Barriers None    Learning Preferences Audio             Education Topics: How Lungs Work and Diseases: - Discuss the anatomy of the lungs and diseases that can affect the lungs, such as COPD.   Exercise: -Discuss the importance of exercise, FITT principles of exercise, normal and abnormal responses to exercise, and how to exercise safely.   Environmental Irritants: -Discuss types of environmental irritants and how to limit exposure to  environmental irritants.   Meds/Inhalers and oxygen: - Discuss respiratory medications, definition of an inhaler and oxygen, and the proper way to use an inhaler and oxygen. Flowsheet Row PULMONARY REHAB OTHER RESPIRATORY from 03/08/2023 in [facility]  Date 02/15/23  Educator handout       Energy Saving Techniques: - Discuss methods to conserve energy and decrease shortness of breath when performing activities of daily living.  Flowsheet Row PULMONARY REHAB OTHER RESPIRATORY from 03/08/2023 in [place] [facility]  Date 02/22/23  Educator handout  Instruction Review Code 1- Verbalizes Understanding       Bronchial Hygiene / Breathing Techniques: - Discuss breathing mechanics, pursed-lip breathing technique,  proper posture, effective ways to clear airways, and other functional breathing techniques Flowsheet Row PULMONARY REHAB OTHER RESPIRATORY from 03/08/2023 in [place] [facility]  Date 03/01/23  Educator HB  Instruction Review Code 1- Personnel officer: - Provides group verbal and written instruction about the health risks of elevated stress, cause of high stress, and healthy ways to reduce stress. Flowsheet Row PULMONARY REHAB OTHER RESPIRATORY from 03/08/2023 in [place] [facility]  Date 03/08/23  Educator [facility]  Instruction Review Code 1- Verbalizes Understanding       Nutrition I: Fats: - Discuss the types of cholesterol, what cholesterol does to the body, and how cholesterol levels can be controlled.   Nutrition II: Labels: -Discuss the different components of food labels and how to read food labels.   Respiratory Infections: - Discuss the signs and symptoms of respiratory infections, ways to prevent respiratory infections, and the importance of seeking medical treatment when having a respiratory infection.   Stress I: Signs and Symptoms: - Discuss the causes of stress,  how stress may lead to anxiety and depression, and ways to limit stress.   Stress II: Relaxation: -Discuss relaxation techniques to limit stress.   Oxygen for Home/Travel: - Discuss how to prepare for travel when on oxygen and proper ways to transport and store oxygen to ensure safety.   Knowledge Questionnaire Score:  Knowledge Questionnaire Score - 01/15/23 1319       Knowledge Questionnaire Score   Pre Score 9/18             Core Components/Risk Factors/Patient Goals at Admission:  Personal Goals and Risk Factors at Admission - 01/15/23 1320       Core Components/Risk Factors/Patient Goals on Admission    Weight Management Obesity;Weight Maintenance    Improve shortness of breath with ADL's Yes    Intervention Provide education, individualized exercise plan and daily activity instruction to help decrease symptoms of  SOB with activities of daily living.    Expected Outcomes Short Term: Improve cardiorespiratory fitness to achieve a reduction of symptoms when performing ADLs;Long Term: Be able to perform more ADLs without symptoms or delay the onset of symptoms    Heart Failure Yes    Intervention Provide a combined exercise and nutrition program that is supplemented with education, support and counseling about heart failure. Directed toward relieving symptoms such as shortness of breath, decreased exercise tolerance, and extremity edema.    Expected Outcomes Improve functional capacity of life    Hypertension Yes    Intervention Provide education on lifestyle modifcations including regular physical activity/exercise, weight management, moderate sodium restriction and increased consumption of fresh fruit, vegetables, and low fat dairy, alcohol moderation, and smoking cessation.;Monitor prescription use compliance.    Expected Outcomes Short Term: Continued assessment and intervention until BP is < 140/75mm HG in hypertensive participants. < 130/35mm HG in hypertensive  participants with diabetes, heart failure or chronic kidney disease.;Long Term: Maintenance of blood pressure at goal levels.    Personal Goal Other Yes    Personal Goal Patient wants to breathe better and have more energy to complete his ADL's.    Intervention Patient will attend PR 2 days/week with exercise and education.    Expected Outcomes Patient will complete the program meeting both personal and program goals.             Core Components/Risk Factors/Patient Goals Review:   Goals and Risk Factor Review     Row Name 02/05/23 1440 03/01/23 1406           Core Components/Risk Factors/Patient Goals Review   Personal Goals Review Develop more efficient breathing techniques such as purse lipped breathing and diaphragmatic breathing and practicing self-pacing with activity.;Improve shortness of breath with ADL's;Other Heart Failure;Hypertension;Weight Management/Obesity;Improve shortness of breath with ADL's      Review Patient is new to the prorgram completing first session.  We will help him with develping efficient breathing techniques such as purse lipped breathing and pratice self pacing with increased activity.   Tolerates exercise with no supplemental oxygen needed at this time, during exercise session his O2 sats range 92%-96%.  Exercised on the treadmill and Nu-step without any problem or difficulty.  His personal goals for the program is he wants to breath better and have more energy to do his ADL's.   We will continue to monitor his progress as he works toward meeting these goals. Pt is taking his blood pressure, cholesterol, and heart medication as prescribed.  We talked about the importance of weighing himself everyday since he has a hx of heart failure.  We also talked about pursed lip breathing to help with his shortness of breath.  The pt checks his blood pressure occasionally at home, but we discussed checking his blood pressure more often at home.      Expected Outcomes  Patient will complete the program meeting both program and personal goals. Short term:  Checking his weight daily at home   Long term:  Being more involved in his plan of care with BP checks at homes               Core Components/Risk Factors/Patient Goals at Discharge (Final Review):   Goals and Risk Factor Review - 03/01/23 1406       Core Components/Risk Factors/Patient Goals Review   Personal Goals Review Heart Failure;Hypertension;Weight Management/Obesity;Improve shortness of breath with ADL's    Review Pt is taking his  blood pressure, cholesterol, and heart medication as prescribed.  We talked about the importance of weighing himself everyday since he has a hx of heart failure.  We also talked about pursed lip breathing to help with his shortness of breath.  The pt checks his blood pressure occasionally at home, but we discussed checking his blood pressure more often at home.    Expected Outcomes Short term:  Checking his weight daily at home   Long term:  Being more involved in his plan of care with BP checks at homes             ITP Comments:  ITP Comments     Row Name 02/14/23 1600 03/14/23 1511         ITP Comments 30 day review completed. ITP sent to Dr.Jehanzeb Memon, Medical Director of  Pulmonary Rehab. Continue with ITP unless changes are made by physician. 30 day review completed. ITP sent to Dr.Jehanzeb Memon, Medical Director of  Pulmonary Rehab. Continue with ITP unless changes are made by physician.               Comments: 30 day review

## 2023-03-15 ENCOUNTER — Encounter (HOSPITAL_COMMUNITY): Payer: Medicare HMO

## 2023-03-15 NOTE — Telephone Encounter (Signed)
Pt left vm stating he needs to reschedule his procedure.  LMTRC

## 2023-03-19 ENCOUNTER — Encounter: Payer: Self-pay | Admitting: *Deleted

## 2023-03-19 ENCOUNTER — Telehealth: Payer: Self-pay | Admitting: *Deleted

## 2023-03-19 NOTE — Telephone Encounter (Signed)
Pt called and rescheduled his appointment from today until 04/20/23 at 1pm with Dr.Carver. He said he didn't have a ride. New instructions mailed to pt

## 2023-03-19 NOTE — Telephone Encounter (Signed)
Pt called and rescheduled his appointment from today until 04/20/23 at 1pm with Dr.Carver. He said he didn't have a ride.

## 2023-03-20 ENCOUNTER — Encounter (HOSPITAL_COMMUNITY): Payer: Medicare HMO

## 2023-03-20 ENCOUNTER — Encounter (HOSPITAL_COMMUNITY)
Admission: RE | Admit: 2023-03-20 | Discharge: 2023-03-20 | Disposition: A | Discharge status: Home / Self Care | Payer: Medicare HMO | Source: Ambulatory Visit | Attending: Pulmonary Disease | Admitting: Pulmonary Disease

## 2023-03-20 DIAGNOSIS — J432 Centrilobular emphysema: Secondary | ICD-10-CM | POA: Diagnosis not present

## 2023-03-20 NOTE — Progress Notes (Signed)
Daily Session Note  Patient Details  Name: Ruben Young MRN: [id number] Date of Birth: 04-04-1951 Referring Provider:   Flowsheet Row PULMONARY REHAB OTHER RESP ORIENTATION from 01/15/2023 in [facility]  Referring Provider Dr. Vassie Loll       Encounter Date: 03/20/2023  Check In:  Session Check In - 03/20/23 1330       Check-In   Supervising physician immediately available to respond to emergencies See telemetry face sheet for immediately available MD    Physician(s) Dr.McDowell    Location [facility]    Staff Present Ross Ludwig, BS, Exercise Physiologist;Ersie Savino, RN;Jessica Elim, MA, RCEP, CCRP, Harolyn Rutherford, RN, BSN    Virtual Visit No    Medication changes reported     No    Fall or balance concerns reported    No    Tobacco Cessation No Change    Warm-up and Cool-down Performed on first and last piece of equipment    Resistance Training Performed Yes    VAD Patient? No    PAD/SET Patient? No      Pain Assessment   Currently in Pain? No/denies    Pain Score 0-No pain    Multiple Pain Sites No             Capillary Blood Glucose: No results found for this or any previous visit (from the past 24 hour(s)).    Social History   Tobacco Use  Smoking Status Former   Current packs/day: 0.00   Average packs/day: 1 pack/day for 57.8 years (57.8 ttl pk-yrs)   Types: Cigarettes   Start date: 1966   Quit date: 05/2022   Years since quitting: 0.8   Passive exposure: Past  Smokeless Tobacco Never    Goals Met:  Proper associated with RPD/PD & O2 Sat Independence with exercise equipment Using PLB without cueing & demonstrates good technique Exercise tolerated well No report of concerns or symptoms today Strength training completed today  Goals Unmet:  Not Applicable  Comments: Pt able to follow exercise prescription today without complaint.  Will continue to monitor for progression.    Dr.  Erick Blinks is Medical Director for [facility] Pulmonary Rehab.

## 2023-03-22 ENCOUNTER — Encounter (HOSPITAL_COMMUNITY): Payer: Medicare HMO

## 2023-03-22 DIAGNOSIS — R972 Elevated prostate specific antigen [PSA]: Secondary | ICD-10-CM | POA: Diagnosis not present

## 2023-03-23 ENCOUNTER — Ambulatory Visit (HOSPITAL_COMMUNITY)
Admission: RE | Admit: 2023-03-23 | Discharge: 2023-03-23 | Disposition: A | Discharge status: Home / Self Care | Payer: Medicare HMO | Source: Ambulatory Visit | Attending: Gastroenterology | Admitting: Gastroenterology

## 2023-03-23 DIAGNOSIS — R140 Abdominal distension (gaseous): Secondary | ICD-10-CM | POA: Diagnosis not present

## 2023-03-23 DIAGNOSIS — Z86010 Personal history of colonic polyps: Secondary | ICD-10-CM | POA: Insufficient documentation

## 2023-03-23 DIAGNOSIS — K7689 Other specified diseases of liver: Secondary | ICD-10-CM | POA: Diagnosis not present

## 2023-03-27 ENCOUNTER — Encounter (HOSPITAL_COMMUNITY): Payer: Medicare HMO

## 2023-03-29 ENCOUNTER — Encounter (HOSPITAL_COMMUNITY): Payer: Medicare HMO

## 2023-03-29 DIAGNOSIS — R972 Elevated prostate specific antigen [PSA]: Secondary | ICD-10-CM | POA: Diagnosis not present

## 2023-03-29 DIAGNOSIS — R351 Nocturia: Secondary | ICD-10-CM | POA: Diagnosis not present

## 2023-03-29 DIAGNOSIS — N401 Enlarged prostate with lower urinary tract symptoms: Secondary | ICD-10-CM | POA: Diagnosis not present

## 2023-04-03 ENCOUNTER — Encounter (HOSPITAL_COMMUNITY): Payer: Medicare HMO

## 2023-04-05 ENCOUNTER — Telehealth (HOSPITAL_COMMUNITY): Payer: Self-pay | Admitting: Emergency Medicine

## 2023-04-05 ENCOUNTER — Encounter (HOSPITAL_COMMUNITY)
Admission: RE | Admit: 2023-04-05 | Discharge: 2023-04-05 | Disposition: A | Discharge status: Home / Self Care | Payer: 59 | Source: Ambulatory Visit | Attending: Pulmonary Disease | Admitting: Pulmonary Disease

## 2023-04-05 ENCOUNTER — Encounter (HOSPITAL_COMMUNITY): Payer: Medicare HMO

## 2023-04-05 DIAGNOSIS — J432 Centrilobular emphysema: Secondary | ICD-10-CM | POA: Insufficient documentation

## 2023-04-05 NOTE — Telephone Encounter (Signed)
Follow-up wellness check. Left message to return call.

## 2023-04-05 NOTE — Progress Notes (Addendum)
Daily Session Note  Patient Details  Name: Ruben Young MRN: [id number] Date of Birth: Jun 03, 1951 Referring Provider:   Flowsheet Row PULMONARY REHAB OTHER RESP ORIENTATION from 01/15/2023 in [facility]  Referring Provider Dr. Vassie Loll       Encounter Date: 04/05/2023  Check In:  Session Check In - 04/05/23 1339       Check-In   Supervising physician immediately available to respond to emergencies See telemetry face sheet for immediately available MD    Location [facility]    Staff Present Fabio Pierce, MA, RCEP, CCRP, Harolyn Rutherford, RN, BSN;Hillary Troutman BSN, RN    Virtual Visit No    Medication changes reported     Yes    Comments started antibiotic 04/03/23    Fall or balance concerns reported    No    Warm-up and Cool-down Performed on first and last piece of equipment    Resistance Training Performed Yes    VAD Patient? No    PAD/SET Patient? No      Pain Assessment   Currently in Pain? No/denies             Capillary Blood Glucose: No results found for this or any previous visit (from the past 24 hour(s)).    Social History   Tobacco Use  Smoking Status Former   Current packs/day: 0.00   Average packs/day: 1 pack/day for 57.8 years (57.8 ttl pk-yrs)   Types: Cigarettes   Start date: 1966   Quit date: 05/2022   Years since quitting: 0.8   Passive exposure: Past  Smokeless Tobacco Never    Goals Met:  Proper associated with RPD/PD & O2 Sat Independence with exercise equipment Exercise tolerated well Personal goals reviewed No report of concerns or symptoms today Strength training completed today  Goals Unmet:  Not Applicable  Comments: Pt able to follow exercise prescription today without complaint.  Will continue to monitor for progression.    Dr. Erick Blinks is Medical Director for [facility] Pulmonary Rehab.

## 2023-04-09 ENCOUNTER — Ambulatory Visit: Payer: 59 | Attending: Cardiology | Admitting: Cardiology

## 2023-04-09 ENCOUNTER — Encounter: Payer: Self-pay | Admitting: Cardiology

## 2023-04-09 VITALS — BP 116/62 | HR 75 | Ht 66.0 in | Wt 177.2 lb

## 2023-04-09 DIAGNOSIS — I2510 Atherosclerotic heart disease of native coronary artery without angina pectoris: Secondary | ICD-10-CM

## 2023-04-09 DIAGNOSIS — J449 Chronic obstructive pulmonary disease, unspecified: Secondary | ICD-10-CM | POA: Diagnosis not present

## 2023-04-09 DIAGNOSIS — I723 Aneurysm of iliac artery: Secondary | ICD-10-CM | POA: Diagnosis not present

## 2023-04-09 NOTE — Patient Instructions (Signed)

## 2023-04-09 NOTE — Progress Notes (Signed)
  Cardiology Office Note:  .   Date:  04/09/2023  ID:  Ruben Young, DOB 01-04-1951, MRN [id number] PCP: Mirna Mires, MD  [facility] Providers Cardiologist:  Donato Schultz, MD    History of Present Illness: .   Ruben Young is a 72 y.o. male Discussed the use of AI scribe software for clinical note transcription with the patient, who gave verbal consent to proceed.  History of Present Illness   72 year old patient with a history of anterior ST elevation myocardial infarction (STEMI) in 2014, treated with a bare metal stent in the left anterior descending artery, presents for a routine follow-up. He also has a history of ischemic cardiomyopathy, left common iliac artery aneurysm, and bilateral pulmonary embolism in 2023, for which he is on Eliquis. He denies any chest pain but reports shortness of breath, which he attributes to his lungs, possibly indicating COPD. He has successfully quit smoking since December, with only occasional urges.  His most recent echocardiogram in December 2023 showed low normal pump function with an EF of 50-55%. He also reports that his urologist recently started him on a new medication due to a high PSA level, with a follow-up planned in two weeks.       ROS: No CP, no SOB  Studies Reviewed: Marland Kitchen        As above Risk Assessment/Calculations:            Physical Exam:   VS:  BP 116/62   Pulse 75   Ht 5\' 6"  (1.676 m)   Wt 177 lb 3.2 oz (80.4 kg)   SpO2 98%   BMI 28.60 kg/m    Wt Readings from Last 3 Encounters:  04/09/23 177 lb 3.2 oz (80.4 kg)  03/14/23 181 lb 7 oz (82.3 kg)  02/12/23 181 lb 6.4 oz (82.3 kg)    GEN: Well nourished, well developed in no acute distress NECK: No JVD; No carotid bruits CARDIAC: RRR, no murmurs, rubs, gallops RESPIRATORY:  Clear to auscultation without rales, wheezing or rhonchi  ABDOMEN: Soft, non-tender, non-distended EXTREMITIES:  No edema; No deformity   ASSESSMENT AND PLAN: .   Assessment and  Plan    Coronary Artery Disease History of anterior ST elevation myocardial infarction in 2014 with bare metal stent placement in the left anterior descending artery. Currently asymptomatic with no chest pain. Echocardiogram in December 2023 showed low normal pump function with EF 50-55%. -Continue Aspirin 81mg  daily, Carvedilol 6.25mg  twice daily, Amlodipine 10mg  daily, and Atorvastatin (dose not specified in conversation).  Pulmonary Embolism History of bilateral pulmonary embolism in December 2023. Currently on Eliquis. -Continue Eliquis (dose not specified in conversation).  Hypertension Currently managed with Carvedilol 6.25mg  twice daily, Amlodipine 10mg  daily, and Hydrochlorothiazide 25mg  daily. Patient reported not taking Hydrochlorothiazide recently due to pharmacy issue, but blood pressure is controlled. -Continue Carvedilol 6.25mg  twice daily, Amlodipine 10mg  daily. Hydrochlorothiazide 25mg  daily can be held given controlled blood pressure.  Chronic Obstructive Pulmonary Disease Reports shortness of breath, likely related to COPD. Patient has quit smoking since December. -Encourage continued smoking cessation.  Elevated PSA Patient reports urologist has started a new medication due to elevated PSA levels. Patient has had two previous biopsies. -Follow up with urologist as recommended.  Follow-up in 1 year unless changes occur.              Signed, Donato Schultz, MD

## 2023-04-10 ENCOUNTER — Encounter (HOSPITAL_COMMUNITY): Payer: Medicare HMO

## 2023-04-10 ENCOUNTER — Encounter (HOSPITAL_COMMUNITY)
Admission: RE | Admit: 2023-04-10 | Discharge: 2023-04-10 | Disposition: A | Discharge status: Home / Self Care | Payer: 59 | Source: Ambulatory Visit | Attending: Pulmonary Disease | Admitting: Pulmonary Disease

## 2023-04-10 DIAGNOSIS — J432 Centrilobular emphysema: Secondary | ICD-10-CM

## 2023-04-10 NOTE — Progress Notes (Signed)
Daily Session Note  Patient Details  Name: Ruben Young MRN: [id number] Date of Birth: 12-Mar-1951 Referring Provider:   Flowsheet Row PULMONARY REHAB OTHER RESP ORIENTATION from 01/15/2023 in [facility]  Referring Provider Dr. Vassie Loll       Encounter Date: 04/10/2023  Check In:  Session Check In - 04/10/23 1330       Check-In   Supervising physician immediately available to respond to emergencies See telemetry face sheet for immediately available MD    Location [facility]    Staff Present Ross Ludwig, BS, Exercise Physiologist;Phyllis Billingsley, RN;Connar Keating Daphine Deutscher, RN, BSN    Virtual Visit No    Medication changes reported     No    Fall or balance concerns reported    No    Tobacco Cessation No Change    Warm-up and Cool-down Performed on first and last piece of equipment    Resistance Training Performed Yes    VAD Patient? No      Pain Assessment   Currently in Pain? No/denies             Capillary Blood Glucose: No results found for this or any previous visit (from the past 24 hour(s)).    Social History   Tobacco Use  Smoking Status Former   Current packs/day: 0.00   Average packs/day: 1 pack/day for 57.8 years (57.8 ttl pk-yrs)   Types: Cigarettes   Start date: 1966   Quit date: 05/2022   Years since quitting: 0.8   Passive exposure: Past  Smokeless Tobacco Never    Goals Met:  Proper associated with RPD/PD & O2 Sat Independence with exercise equipment Using PLB without cueing & demonstrates good technique Exercise tolerated well Queuing for purse lip breathing No report of concerns or symptoms today Strength training completed today  Goals Unmet:  Not Applicable  Comments: Pt able to follow exercise prescription today without complaint.  Will continue to monitor for progression.    Dr. Erick Blinks is Medical Director for [facility] Pulmonary Rehab.

## 2023-04-11 ENCOUNTER — Encounter (HOSPITAL_COMMUNITY): Payer: Self-pay | Admitting: *Deleted

## 2023-04-11 DIAGNOSIS — J432 Centrilobular emphysema: Secondary | ICD-10-CM

## 2023-04-11 NOTE — Progress Notes (Signed)
Pulmonary Individual Treatment Plan  Patient Details  Name: Ruben Young MRN: [id number] Date of Birth: 07/28/1951 Referring Provider:   Flowsheet Row PULMONARY REHAB OTHER RESP ORIENTATION from 01/15/2023 in [facility] CARDIAC REHABILITATION  Referring Provider Dr. Vassie Loll       Initial Encounter Date:  Flowsheet Row PULMONARY REHAB OTHER RESP ORIENTATION from 01/15/2023 in [facility] CARDIAC REHABILITATION  Date 01/15/23       Visit Diagnosis: Centrilobular emphysema (HCC)  Patient's Home Medications on Admission:   Current Outpatient Medications:    albuterol (VENTOLIN HFA) 108 (90 Base) MCG/ACT inhaler, Inhale 2 puffs into the lungs every 4 (four) hours as needed for wheezing or shortness of breath., Disp: 6.7 g, Rfl: 1   amLODipine (NORVASC) 10 MG tablet, TAKE 1 TABLET(10 MG) BY MOUTH DAILY, Disp: 90 tablet, Rfl: 3   apixaban (ELIQUIS) 5 MG TABS tablet, TAKE 1 TABLET(5 MG) BY MOUTH TWICE DAILY, Disp: 60 tablet, Rfl: 2   Ascorbic Acid (VITAMIN C PO), Take 1,000 mg by mouth every other day., Disp: , Rfl:    aspirin EC 81 MG tablet, Take 81 mg by mouth daily., Disp: , Rfl:    atorvastatin (LIPITOR) 40 MG tablet, TAKE 1 TABLET BY MOUTH DAILY AT 6 PM, Disp: 90 tablet, Rfl: 2   B Complex Vitamins (B COMPLEX PO), Take 1 capsule by mouth every other day., Disp: , Rfl:    CALCIUM PO, Take 1 tablet by mouth every other day., Disp: , Rfl:    carvedilol (COREG) 6.25 MG tablet, TAKE 1 TABLET(6.25 MG) BY MOUTH TWICE DAILY WITH A MEAL, Disp: 180 tablet, Rfl: 2   Cholecalciferol (VITAMIN D3) 25 MCG (1000 UT) CAPS, Take 1,000 Units by mouth daily. , Disp: , Rfl:    ferrous sulfate 325 (65 FE) MG EC tablet, Take 325 mg by mouth every other day., Disp: , Rfl:    GINSENG PO, Take 1 capsule by mouth every other day., Disp: , Rfl:    hydrochlorothiazide (HYDRODIURIL) 25 MG tablet, Take 25 mg by mouth daily., Disp: , Rfl:    hydrocortisone cream 1 %, Apply 1 Application topically daily as needed  for itching., Disp: , Rfl:    Multiple Vitamin (MULTIVITAMIN WITH MINERALS) TABS tablet, Take 1 tablet by mouth daily., Disp: , Rfl:    nitroGLYCERIN (NITROSTAT) 0.4 MG SL tablet, Place 1 tablet (0.4 mg total) under the tongue every 5 (five) minutes x 3 doses as needed for chest pain. For chest pains, Disp: 25 tablet, Rfl: 0   Omega-3 Fatty Acids (FISH OIL) 1000 MG CAPS, Take 1,000 mg by mouth every other day., Disp: , Rfl:    POTASSIUM PO, Take 1 tablet by mouth every other day., Disp: , Rfl:    sulfamethoxazole-trimethoprim (BACTRIM DS) 800-160 MG tablet, Take 1 tablet by mouth 2 (two) times daily., Disp: , Rfl:    vitamin E 400 UNIT capsule, Take 400 Units by mouth every other day., Disp: , Rfl:   Past Medical History: Past Medical History:  Diagnosis Date   AAA (abdominal aortic aneurysm) (HCC)    DDD (degenerative disc disease), lumbar    DVT (deep venous thrombosis) (HCC)    Dyspnea    Hyperlipidemia    Hypertension    MI (myocardial infarction) (HCC)    2018   Old myocardial infarction    Pulmonary emboli (HCC)    Tobacco use disorder     Tobacco Use: Social History   Tobacco Use  Smoking Status Former  Current packs/day: 0.00   Average packs/day: 1 pack/day for 57.8 years (57.8 ttl pk-yrs)   Types: Cigarettes   Start date: 57   Quit date: 05/2022   Years since quitting: 0.8   Passive exposure: Past  Smokeless Tobacco Never    Labs: Review Flowsheet  More data exists      Latest Ref Rng & Units 12/15/2011 09/24/2012 09/25/2012 04/14/2014 06/30/2022  Labs for ITP Cardiac and Pulmonary Rehab  Cholestrol 0 - 200 mg/dL - - 284  132  -  LDL (calc) 0 - 99 mg/dL - - 81  63  -  HDL-C >44.01 mg/dL - - 34  02.72  -  Trlycerides 0.0 - 149.0 mg/dL - - 91  53.6  -  PH, Arterial 7.35 - 7.45 - - - - 7.5   PCO2 arterial 32 - 48 mmHg - - - - 38   Bicarbonate 20.0 - 28.0 mmol/L - - - - 29.7   TCO2 0 - 100 mmol/L 28  24  - - -  O2 Saturation % - - - - 84.7     Details             Capillary Blood Glucose: No results found for: "GLUCAP"   Pulmonary Assessment Scores:  Pulmonary Assessment Scores     Row Name 01/15/23 1316         ADL UCSD   ADL Phase Entry     SOB Score total 40     Rest 1     Walk 1     Stairs 3     Bath 2     Dress 2     Shop 3       CAT Score   CAT Score 21       mMRC Score   mMRC Score 3             UCSD: Self-administered rating of dyspnea associated with activities of daily living (ADLs) 6-point scale (0 = "not at all" to 5 = "maximal or unable to do because of breathlessness")  Scoring Scores range from 0 to 120.  Minimally important difference is 5 units  CAT: CAT can identify the health impairment of COPD patients and is better correlated with disease progression.  CAT has a scoring range of zero to 40. The CAT score is classified into four groups of low (less than 10), medium (10 - 20), high (21-30) and very high (31-40) based on the impact level of disease on health status. A CAT score over 10 suggests significant symptoms.  A worsening CAT score could be explained by an exacerbation, poor medication adherence, poor inhaler technique, or progression of COPD or comorbid conditions.  CAT MCID is 2 points  mMRC: mMRC (Modified Medical Research Council) Dyspnea Scale is used to assess the degree of baseline functional disability in patients of respiratory disease due to dyspnea. No minimal important difference is established. A decrease in score of 1 point or greater is considered a positive change.   Pulmonary Function Assessment:   Exercise Target Goals: Exercise Program Goal: Individual exercise prescription set using results from initial 6 min walk test and THRR while considering  patient's activity barriers and safety.   Exercise Prescription Goal: Initial exercise prescription builds to 30-45 minutes a day of aerobic activity, 2-3 days per week.  Home exercise guidelines will be given to patient  during program as part of exercise prescription that the participant will acknowledge.  Activity Barriers & Risk  Stratification:  Activity Barriers & Cardiac Risk Stratification - 01/15/23 1243       Activity Barriers & Cardiac Risk Stratification   Activity Barriers Arthritis;Back Problems;Deconditioning;Shortness of Breath    Cardiac Risk Stratification High             6 Minute Walk:  6 Minute Walk     Row Name 01/15/23 1337         6 Minute Walk   Phase Initial     Distance 1200 feet     Walk Time 6 minutes     # of Rest Breaks 0     MPH 2.27     METS 2.61     RPE 12     Perceived Dyspnea  13     VO2 Peak 9.15     Symptoms No     Resting HR 73 bpm     Resting BP 112/50     Resting Oxygen Saturation  96 %     Exercise Oxygen Saturation  during 6 min walk 90 %     Max Ex. HR 92 bpm     Max Ex. BP 136/60     2 Minute Post BP 118/60       Interval HR   1 Minute HR 80     2 Minute HR 81     3 Minute HR 87     4 Minute HR 92     5 Minute HR 90     6 Minute HR 92     2 Minute Post HR 76     Interval Heart Rate? Yes       Interval Oxygen   Interval Oxygen? Yes     Baseline Oxygen Saturation % 96 %     1 Minute Oxygen Saturation % 92 %     1 Minute Liters of Oxygen 0 L     2 Minute Oxygen Saturation % 92 %     2 Minute Liters of Oxygen 0 L     3 Minute Oxygen Saturation % 90 %     3 Minute Liters of Oxygen 0 L     4 Minute Oxygen Saturation % 91 %     4 Minute Liters of Oxygen 0 L     5 Minute Oxygen Saturation % 92 %     5 Minute Liters of Oxygen 0 L     6 Minute Oxygen Saturation % 93 %     6 Minute Liters of Oxygen 0 L     2 Minute Post Oxygen Saturation % 96 %     2 Minute Post Liters of Oxygen 0 L              Oxygen Initial Assessment:  Oxygen Initial Assessment - 01/15/23 1316       Initial 6 min Walk   Oxygen Used None             Oxygen Re-Evaluation:  Oxygen Re-Evaluation     Row Name 01/15/23 1316 03/01/23 1413 03/02/23  1235 04/05/23 1341       Program Oxygen Prescription   Program Oxygen Prescription -- None -- None      Home Oxygen   Home Oxygen Device -- None -- None    Sleep Oxygen Prescription -- None -- None    Home Exercise Oxygen Prescription -- None -- None    Home Resting Oxygen Prescription -- None -- None      Goals/Expected  Outcomes   Short Term Goals -- To learn and understand importance of monitoring SPO2 with pulse oximeter and demonstrate accurate use of the pulse oximeter.;To learn and understand importance of maintaining oxygen saturations>88%;To learn and demonstrate proper pursed lip breathing techniques or other breathing techniques. ;To learn and demonstrate proper use of respiratory medications To learn and demonstrate proper pursed lip breathing techniques or other breathing techniques.  To learn and understand importance of maintaining oxygen saturations>88%;To learn and demonstrate proper pursed lip breathing techniques or other breathing techniques. ;To learn and demonstrate proper use of respiratory medications;To learn and understand importance of monitoring SPO2 with pulse oximeter and demonstrate accurate use of the pulse oximeter.    Long  Term Goals -- Verbalizes importance of monitoring SPO2 with pulse oximeter and return demonstration;Maintenance of O2 saturations>88%;Exhibits proper breathing techniques, such as pursed lip breathing or other method taught during program session;Compliance with respiratory medication Exhibits proper breathing techniques, such as pursed lip breathing or other method taught during program session Verbalizes importance of monitoring SPO2 with pulse oximeter and return demonstration;Maintenance of O2 saturations>88%;Exhibits proper breathing techniques, such as pursed lip breathing or other method taught during program session;Compliance with respiratory medication;Demonstrates proper use of MDI's    Comments -- Pt has no home 02 or CPAP.  He uses his  inhaler as needed.  Class education today on importance of PLB. Diaphragmatic and PLB breathing explained and performed with patient. Patient has a better understanding of how to do these exercises to help with breathing performance and relaxation. Patient performed breathing techniques adequately and to practice further at home. Pt does not wear any oxygen, but he has an albuteral inhaler that he uses when needed.  He is practicing pursed lip breathing in class and is also trying to utilize PLB at home.  We discussed the pt getting a pulse ox and checking his oxygen level at home.  He states that he is willing to do this and understands that his oxygen level should be 88% or higher.    Goals/Expected Outcomes -- Short term:  Pt will become more proficient with PLB   Long term:  Pt will utilize PLB in everyday life Short: practice PLB and diaphragmatic breathing at home. Long: Use PLB and diaphragmatic breathing independently Short term:  Pt will purchase a pulse ox and check his oxygen level at home  Long term:  Continue to use PLB and take his medications as prescribed             Oxygen Discharge (Final Oxygen Re-Evaluation):  Oxygen Re-Evaluation - 04/05/23 1341       Program Oxygen Prescription   Program Oxygen Prescription None      Home Oxygen   Home Oxygen Device None    Sleep Oxygen Prescription None    Home Exercise Oxygen Prescription None    Home Resting Oxygen Prescription None      Goals/Expected Outcomes   Short Term Goals To learn and understand importance of maintaining oxygen saturations>88%;To learn and demonstrate proper pursed lip breathing techniques or other breathing techniques. ;To learn and demonstrate proper use of respiratory medications;To learn and understand importance of monitoring SPO2 with pulse oximeter and demonstrate accurate use of the pulse oximeter.    Long  Term Goals Verbalizes importance of monitoring SPO2 with pulse oximeter and return  demonstration;Maintenance of O2 saturations>88%;Exhibits proper breathing techniques, such as pursed lip breathing or other method taught during program session;Compliance with respiratory medication;Demonstrates proper use of MDI's  Comments Pt does not wear any oxygen, but he has an albuteral inhaler that he uses when needed.  He is practicing pursed lip breathing in class and is also trying to utilize PLB at home.  We discussed the pt getting a pulse ox and checking his oxygen level at home.  He states that he is willing to do this and understands that his oxygen level should be 88% or higher.    Goals/Expected Outcomes Short term:  Pt will purchase a pulse ox and check his oxygen level at home  Long term:  Continue to use PLB and take his medications as prescribed             Initial Exercise Prescription:  Initial Exercise Prescription - 01/15/23 1300       Date of Initial Exercise RX and Referring Provider   Date 01/15/23    Referring Provider Dr. Vassie Loll    Expected Discharge Date 05/22/23      Treadmill   MPH 2    Grade 0    Minutes 17      NuStep   Level 1    SPM 60    Minutes 22      Prescription Details   Frequency (times per week) 2    Duration Progress to 30 minutes of continuous aerobic without signs/symptoms of physical distress      Intensity   THRR 40-80% of Max Heartrate 59.118    Ratings of Perceived Exertion 11-13    Perceived Dyspnea 0-4      Resistance Training   Training Prescription Yes    Weight 3    Reps 10-15             Perform Capillary Blood Glucose checks as needed.  Exercise Prescription Changes:   Exercise Prescription Changes     Row Name 01/30/23 1400 02/27/23 1400 03/13/23 1400 04/05/23 1400       Response to Exercise   Blood Pressure (Admit) 116/60 138/76 138/78 122/68    Blood Pressure (Exercise) 130/60 130/60 -- --    Blood Pressure (Exit) 126/64 106/60 128/72 122/62    Heart Rate (Admit) 69 bpm 68 bpm 62 bpm 62 bpm     Heart Rate (Exercise) 106 bpm 89 bpm 97 bpm 89 bpm    Heart Rate (Exit) 75 bpm 76 bpm 72 bpm 70 bpm    Oxygen Saturation (Admit) 94 % 94 % 95 % 97 %    Oxygen Saturation (Exercise) 92 % 93 % 93 % 95 %    Oxygen Saturation (Exit) 97 % 97 % 97 % 96 %    Rating of Perceived Exertion (Exercise) 12 12 11 13     Perceived Dyspnea (Exercise) 12 2 2 3     Duration Continue with 30 min of aerobic exercise without signs/symptoms of physical distress. Continue with 30 min of aerobic exercise without signs/symptoms of physical distress. Continue with 30 min of aerobic exercise without signs/symptoms of physical distress. Continue with 30 min of aerobic exercise without signs/symptoms of physical distress.    Intensity THRR unchanged THRR unchanged THRR unchanged THRR unchanged      Progression   Progression Continue to progress workloads to maintain intensity without signs/symptoms of physical distress. Continue to progress workloads to maintain intensity without signs/symptoms of physical distress. Continue to progress workloads to maintain intensity without signs/symptoms of physical distress. Continue to progress workloads to maintain intensity without signs/symptoms of physical distress.      Paramedic  Prescription Yes Yes Yes Yes    Weight 3 4 4 4     Reps 10-15 10-15 10-15 10-15    Time 10 Minutes -- -- --      Treadmill   MPH 1.7 1.6 2 1.8    Grade 0 0 0 0    Minutes 17 15 15 15     METs 2.3 2.23 2.53 2.38      NuStep   Level 1 1 1 2     SPM 59 73 77 70    Minutes 22 15 15 15     METs 1.8 1.8 1.9 1.9      Oxygen   Maintain Oxygen Saturation -- 88% or higher 88% or higher 88% or higher             Exercise Comments:   Exercise Goals and Review:   Exercise Goals     Row Name 01/15/23 1340             Exercise Goals   Increase Physical Activity Yes       Intervention Provide advice, education, support and counseling about physical activity/exercise  needs.;Develop an individualized exercise prescription for aerobic and resistive training based on initial evaluation findings, risk stratification, comorbidities and participant's personal goals.       Expected Outcomes Short Term: Attend rehab on a regular basis to increase amount of physical activity.;Long Term: Add in home exercise to make exercise part of routine and to increase amount of physical activity.;Long Term: Exercising regularly at least 3-5 days a week.       Increase Strength and Stamina Yes       Intervention Provide advice, education, support and counseling about physical activity/exercise needs.;Develop an individualized exercise prescription for aerobic and resistive training based on initial evaluation findings, risk stratification, comorbidities and participant's personal goals.       Expected Outcomes Short Term: Increase workloads from initial exercise prescription for resistance, speed, and METs.;Long Term: Improve cardiorespiratory fitness, muscular endurance and strength as measured by increased METs and functional capacity ( );Short Term: Perform resistance training exercises routinely during rehab and add in resistance training at home       Able to understand and use rate of perceived exertion (RPE) scale Yes       Intervention Provide education and explanation on how to use RPE scale       Expected Outcomes Short Term: Able to use RPE daily in rehab to express subjective intensity level;Long Term:  Able to use RPE to guide intensity level when exercising independently       Able to understand and use Dyspnea scale Yes       Intervention Provide education and explanation on how to use Dyspnea scale       Expected Outcomes Short Term: Able to use Dyspnea scale daily in rehab to express subjective sense of shortness of breath during exertion;Long Term: Able to use Dyspnea scale to guide intensity level when exercising independently       Knowledge and understanding of  Target Heart Rate Range (THRR) Yes       Intervention Provide education and explanation of THRR including how the numbers were predicted and where they are located for reference       Expected Outcomes Short Term: Able to state/look up THRR;Long Term: Able to use THRR to govern intensity when exercising independently;Short Term: Able to use daily as guideline for intensity in rehab       Understanding of Exercise Prescription Yes  Intervention Provide education, explanation, and written materials on patient's individual exercise prescription       Expected Outcomes Short Term: Able to explain program exercise prescription;Long Term: Able to explain home exercise prescription to exercise independently                Exercise Goals Re-Evaluation :  Exercise Goals Re-Evaluation     Row Name 02/28/23 1307 03/01/23 1347 03/20/23 0959 04/05/23 1335       Exercise Goal Re-Evaluation   Exercise Goals Review -- Increase Physical Activity;Increase Strength and Stamina;Understanding of Exercise Prescription Increase Physical Activity;Understanding of Exercise Prescription;Increase Strength and Stamina Increase Physical Activity;Increase Strength and Stamina;Understanding of Exercise Prescription    Comments Pt has not increased his levles since starting rehab. He continues to exercise at an RPE of 12-13. Will continue to monitor and progress as able, Pt has not been feeling any stronger since starting rehab.  Pt has increased his speed on the treadmill and states that he feels ok, maybe just a little more out of breath.  He is not currently do any exercise at home.  We will continue to monitor his progress. Pt has increased his speed on the treadmill to 2.0 but has not increased his level on the NuStep. He is still at level 1 on the stepper. Will continue to monitor and progress as able Pt does not feel like he is stronger since starting the program.  He states that he still gets short of breath with  activity, but he has increased his speed on the treadmill and is willing to increase to level 2 today on the Nustep.  He currently does not exerise at home.    Expected Outcomes -- Short term:  Go over home exercise routine  Long term:  Start a home exercise program short: increase level on the NuStep to level 2   long: continue to attend pulmonary rehab Short term:  Pt will continue to increase his speed/levels on the equipment in rehab  Long term:  Pt will start a home exercise program and continue to attend PR             Discharge Exercise Prescription (Final Exercise Prescription Changes):  Exercise Prescription Changes - 04/05/23 1400       Response to Exercise   Blood Pressure (Admit) 122/68    Blood Pressure (Exit) 122/62    Heart Rate (Admit) 62 bpm    Heart Rate (Exercise) 89 bpm    Heart Rate (Exit) 70 bpm    Oxygen Saturation (Admit) 97 %    Oxygen Saturation (Exercise) 95 %    Oxygen Saturation (Exit) 96 %    Rating of Perceived Exertion (Exercise) 13    Perceived Dyspnea (Exercise) 3    Duration Continue with 30 min of aerobic exercise without signs/symptoms of physical distress.    Intensity THRR unchanged      Progression   Progression Continue to progress workloads to maintain intensity without signs/symptoms of physical distress.      Resistance Training   Training Prescription Yes    Weight 4    Reps 10-15      Treadmill   MPH 1.8    Grade 0    Minutes 15    METs 2.38      NuStep   Level 2    SPM 70    Minutes 15    METs 1.9      Oxygen   Maintain Oxygen Saturation 88% or higher  Nutrition:  Target Goals: Understanding of nutrition guidelines, daily intake of sodium 1500mg , cholesterol 200mg , calories 30% from fat and 7% or less from saturated fats, daily to have 5 or more servings of fruits and vegetables.  Biometrics:  Pre Biometrics - 01/15/23 1340       Pre Biometrics   Height 5\' 9"  (1.753 m)    Weight 179 lb 7.3 oz  (81.4 kg)    Waist Circumference 42 inches    Hip Circumference 39 inches    Waist to Hip Ratio 1.08 %    BMI (Calculated) 26.49    Triceps Skinfold 15 mm    % Body Fat 28.2 %    Grip Strength 27.7 kg    Flexibility 0 in    Single Leg Stand 0 seconds              Nutrition Therapy Plan and Nutrition Goals:  Nutrition Therapy & Goals - 01/15/23 1318       Nutrition Therapy   RD appointment deferred Yes      Personal Nutrition Goals   Comments Patient's diet assessment score was 109. He says he cooks his own meals. Handout provided and explained regarding healthier choices and referral to RD offered. Patient declined. He says he is happy with his diet. We will continue to monitor.      Intervention Plan   Intervention Nutrition handout(s) given to patient.    Expected Outcomes Short Term Goal: Understand basic principles of dietary content, such as calories, fat, sodium, cholesterol and nutrients.             Nutrition Assessments:  Nutrition Assessments - 01/15/23 1318       MEDFICTS Scores   Pre Score 109            MEDIFICTS Score Key: >=70 Need to make dietary changes  40-70 Heart Healthy Diet <= 40 Therapeutic Level Cholesterol Diet   Picture Your Plate Scores: <16 Unhealthy dietary pattern with much room for improvement. 41-50 Dietary pattern unlikely to meet recommendations for good health and room for improvement. 51-60 More healthful dietary pattern, with some room for improvement.  >60 Healthy dietary pattern, although there may be some specific behaviors that could be improved.    Nutrition Goals Re-Evaluation:  Nutrition Goals Re-Evaluation     Row Name 03/01/23 1400 04/05/23 1352           Goals   Nutrition Goal Heart healthy diet with adequate protein Heart healthy diet with adequate protein      Comment Pt eats 2 meals a day-breakfast and supper.  He does eat some red meats but he also likes chicken and fish (fried fish).  He  states that he does not eat a lot of salty foods.  He does eat fruits and vegetables but not that often. Pt continues to eat only 2 meals a day.  He prefers fried fish and chicken, but states that his cardiologist told him to used canola oil which he does when he fries his food.  He tries to stay away from salty foods, and he eats fruits/vegetables but not that often.      Expected Outcome Short term:  Adding more fruits/veggies to his diet    Long term:  Continue to work on healthy eating habits such as eating less fried foods Short term:    Try to eat more baked vs fried foods          Long term:  Continue to  work on healthy eating habits and getting adequate fruits/vegetales in his diet               Nutrition Goals Discharge (Final Nutrition Goals Re-Evaluation):  Nutrition Goals Re-Evaluation - 04/05/23 1352       Goals   Nutrition Goal Heart healthy diet with adequate protein    Comment Pt continues to eat only 2 meals a day.  He prefers fried fish and chicken, but states that his cardiologist told him to used canola oil which he does when he fries his food.  He tries to stay away from salty foods, and he eats fruits/vegetables but not that often.    Expected Outcome Short term:    Try to eat more baked vs fried foods          Long term:  Continue to work on healthy eating habits and getting adequate fruits/vegetales in his diet             Psychosocial: Target Goals: Acknowledge presence or absence of significant depression and/or stress, maximize coping skills, provide positive support system. Participant is able to verbalize types and ability to use techniques and skills needed for reducing stress and depression.  Initial Review & Psychosocial Screening:  Initial Psych Review & Screening - 01/15/23 1322       Initial Review   Current issues with Current Sleep Concerns      Family Dynamics   Good Support System? Yes      Barriers   Psychosocial barriers to participate in  program There are no identifiable barriers or psychosocial needs.;The patient should benefit from training in stress management and relaxation.      Screening Interventions   Interventions To provide support and resources with identified psychosocial needs;Provide feedback about the scores to participant;Encouraged to exercise    Expected Outcomes Short Term goal: Identification and review with participant of any Quality of Life or Depression concerns found by scoring the questionnaire.;Short Term goal: Utilizing psychosocial counselor, staff and physician to assist with identification of specific Stressors or current issues interfering with healing process. Setting desired goal for each stressor or current issue identified.             Quality of Life Scores:  Quality of Life - 01/15/23 1341       Quality of Life   Select Quality of Life      Quality of Life Scores   Health/Function Pre 18.54 %    Socioeconomic Pre 20.67 %    Psych/Spiritual Pre 21.07 %    Family Pre 21 %    GLOBAL Pre 19.71 %            Scores of 19 and below usually indicate a poorer quality of life in these areas.  A difference of  2-3 points is a clinically meaningful difference.  A difference of 2-3 points in the total score of the Quality of Life Index has been associated with significant improvement in overall quality of life, self-image, physical symptoms, and general health in studies assessing change in quality of life.   PHQ-9: Review Flowsheet       01/15/2023  Depression screen PHQ 2/9  Decreased Interest 3  Down, Depressed, Hopeless 0  PHQ - 2 Score 3  Altered sleeping 1  Tired, decreased energy 3  Change in appetite 0  Feeling bad or failure about yourself  0  Trouble concentrating 0  Moving slowly or fidgety/restless 0  Suicidal thoughts 0  PHQ-9  Score 7  Difficult doing work/chores Not difficult at all    Details           Interpretation of Total Score  Total Score  Depression Severity:  1-4 = Minimal depression, 5-9 = Mild depression, 10-14 = Moderate depression, 15-19 = Moderately severe depression, 20-27 = Severe depression   Psychosocial Evaluation and Intervention:  Psychosocial Evaluation - 01/15/23 1323       Psychosocial Evaluation & Interventions   Interventions Stress management education;Relaxation education;Encouraged to exercise with the program and follow exercise prescription    Comments Patient has no psychosocial barriers identified at his orientation visit. His initial PHQ-9 score was 7 due to him not having any energy to do anything and his SOB. He denies any depression or anxiety. He does say he has trouble staying asleep sometimes but does not take anything. He says this is not a problem. He says he has support from his brother-in-law and some friends. He does have 3 children but says he does not talk to them often. He lives alone and uses [employer] for his transportation. He says he plans to set this up himself. He is ready to start the program hoping to gain some energy and have less SOB.    Expected Outcomes Patient will continue to have no psychosocial barriers identified.    Continue Psychosocial Services  No Follow up required             Psychosocial Re-Evaluation:  Psychosocial Re-Evaluation     Row Name 02/05/23 1422 03/01/23 1352 04/05/23 1347         Psychosocial Re-Evaluation   Current issues with None Identified Current Sleep Concerns Current Sleep Concerns     Comments Patient is new to program starting his first session.  His intial QOL score is 19.71%  and his PHQ 9 score is 7 .  Patient  was referred to PR by Dr.Rakesh with diagnosis of centrilobular emphysema.  He seems to enjoy coming to the program and  interactive with class and staff, also very talkeative and friendly.   Patient has no psychosocial issues noted at this time.  He demonstrates an interest in improving his breathing health. Pt still has sleep  concerns, since he wakes up multiple times during the night and may be up for an hour or so.  This is a chonic problem but he does not want to take any sleep aids. Pt still has sleep issues, since he wakes up multiple times during the night.  He states that he gets 2-3 hours of sleep at time.  He does not want to take any sleep aids, and most of the time he falls alseep with the tv on.     Expected Outcomes Patient will  have no psychosocial issues identified at dischage. Short term:  Pt will try to get 6 hours of sleep a day          Long term:  Pt will have no identifiable psychosocial barriers Short term: Pt will try Melatonin OTC to see if he can get more sleep                Long term:  Pt will continue to have no identifiable psychosocial barriers     Interventions Stress management education;Relaxation education;Encouraged to attend Pulmonary Rehabilitation for the exercise Stress management education;Relaxation education;Encouraged to attend Pulmonary Rehabilitation for the exercise Encouraged to attend Pulmonary Rehabilitation for the exercise;Stress management education;Relaxation education     Continue Psychosocial Services  No Follow up required Follow up required by staff Follow up required by staff              Psychosocial Discharge (Final Psychosocial Re-Evaluation):  Psychosocial Re-Evaluation - 04/05/23 1347       Psychosocial Re-Evaluation   Current issues with Current Sleep Concerns    Comments Pt still has sleep issues, since he wakes up multiple times during the night.  He states that he gets 2-3 hours of sleep at time.  He does not want to take any sleep aids, and most of the time he falls alseep with the tv on.    Expected Outcomes Short term: Pt will try Melatonin OTC to see if he can get more sleep                Long term:  Pt will continue to have no identifiable psychosocial barriers    Interventions Encouraged to attend Pulmonary Rehabilitation for the exercise;Stress  management education;Relaxation education    Continue Psychosocial Services  Follow up required by staff              Education: Education Goals: Education classes will be provided on a weekly basis, covering required topics. Participant will state understanding/return demonstration of topics presented.  Learning Barriers/Preferences:  Learning Barriers/Preferences - 01/15/23 1319       Learning Barriers/Preferences   Learning Barriers None    Learning Preferences Audio             Education Topics: How Lungs Work and Diseases: - Discuss the anatomy of the lungs and diseases that can affect the lungs, such as COPD. Flowsheet Row PULMONARY REHAB OTHER RESPIRATORY from 04/05/2023 in [facility]  Date 04/05/23  Educator [facility]  Instruction Review Code 1- Verbalizes Understanding       Exercise: -Discuss the importance of exercise, FITT principles of exercise, normal and abnormal responses to exercise, and how to exercise safely.   Environmental Irritants: -Discuss types of environmental irritants and how to limit exposure to environmental irritants.   Meds/Inhalers and oxygen: - Discuss respiratory medications, definition of an inhaler and oxygen, and the proper way to use an inhaler and oxygen. Flowsheet Row PULMONARY REHAB OTHER RESPIRATORY from 04/05/2023 in [place] [facility]  Date 02/15/23  Educator handout       Energy Saving Techniques: - Discuss methods to conserve energy and decrease shortness of breath when performing activities of daily living.  Flowsheet Row PULMONARY REHAB OTHER RESPIRATORY from 04/05/2023 in [facility]  Date 02/22/23  Educator handout  Instruction Review Code 1- Verbalizes Understanding       Bronchial Hygiene / Breathing Techniques: - Discuss breathing mechanics, pursed-lip breathing technique,  proper posture, effective ways to clear airways, and other functional breathing  techniques Flowsheet Row PULMONARY REHAB OTHER RESPIRATORY from 04/05/2023 in [place] [facility]  Date 03/01/23  Educator [facility]  Instruction Review Code 1- Personnel officer: - Provides group verbal and written instruction about the health risks of elevated stress, cause of high stress, and healthy ways to reduce stress. Flowsheet Row PULMONARY REHAB OTHER RESPIRATORY from 04/05/2023 in [place] [facility]  Date 03/08/23  Educator [facility]  Instruction Review Code 1- Verbalizes Understanding       Nutrition I: Fats: - Discuss the types of cholesterol, what cholesterol does to the body, and how cholesterol levels can be controlled.   Nutrition II: Labels: -Discuss the different  components of food labels and how to read food labels.   Respiratory Infections: - Discuss the signs and symptoms of respiratory infections, ways to prevent respiratory infections, and the importance of seeking medical treatment when having a respiratory infection.   Stress I: Signs and Symptoms: - Discuss the causes of stress, how stress may lead to anxiety and depression, and ways to limit stress.   Stress II: Relaxation: -Discuss relaxation techniques to limit stress.   Oxygen for Home/Travel: - Discuss how to prepare for travel when on oxygen and proper ways to transport and store oxygen to ensure safety.   Knowledge Questionnaire Score:  Knowledge Questionnaire Score - 01/15/23 1319       Knowledge Questionnaire Score   Pre Score 9/18             Core Components/Risk Factors/Patient Goals at Admission:  Personal Goals and Risk Factors at Admission - 01/15/23 1320       Core Components/Risk Factors/Patient Goals on Admission    Weight Management Obesity;Weight Maintenance    Improve shortness of breath with ADL's Yes    Intervention Provide education, individualized exercise plan and daily activity instruction to help decrease  symptoms of SOB with activities of daily living.    Expected Outcomes Short Term: Improve cardiorespiratory fitness to achieve a reduction of symptoms when performing ADLs;Long Term: Be able to perform more ADLs without symptoms or delay the onset of symptoms    Heart Failure Yes    Intervention Provide a combined exercise and nutrition program that is supplemented with education, support and counseling about heart failure. Directed toward relieving symptoms such as shortness of breath, decreased exercise tolerance, and extremity edema.    Expected Outcomes Improve functional capacity of life    Hypertension Yes    Intervention Provide education on lifestyle modifcations including regular physical activity/exercise, weight management, moderate sodium restriction and increased consumption of fresh fruit, vegetables, and low fat dairy, alcohol moderation, and smoking cessation.;Monitor prescription use compliance.    Expected Outcomes Short Term: Continued assessment and intervention until BP is < 140/72mm HG in hypertensive participants. < 130/68mm HG in hypertensive participants with diabetes, heart failure or chronic kidney disease.;Long Term: Maintenance of blood pressure at goal levels.    Personal Goal Other Yes    Personal Goal Patient wants to breathe better and have more energy to complete his ADL's.    Intervention Patient will attend PR 2 days/week with exercise and education.    Expected Outcomes Patient will complete the program meeting both personal and program goals.             Core Components/Risk Factors/Patient Goals Review:   Goals and Risk Factor Review     Row Name 02/05/23 1440 03/01/23 1406 04/05/23 1402         Core Components/Risk Factors/Patient Goals Review   Personal Goals Review Develop more efficient breathing techniques such as purse lipped breathing and diaphragmatic breathing and practicing self-pacing with activity.;Improve shortness of breath with  ADL's;Other Heart Failure;Hypertension;Weight Management/Obesity;Improve shortness of breath with ADL's Weight Management/Obesity;Heart Failure;Hypertension;Improve shortness of breath with ADL's     Review Patient is new to the prorgram completing first session.  We will help him with develping efficient breathing techniques such as purse lipped breathing and pratice self pacing with increased activity.   Tolerates exercise with no supplemental oxygen needed at this time, during exercise session his O2 sats range 92%-96%.  Exercised on the treadmill and Nu-step without any problem or difficulty.  His personal goals for the program is he wants to breath better and have more energy to do his ADL's.   We will continue to monitor his progress as he works toward meeting these goals. Pt is taking his blood pressure, cholesterol, and heart medication as prescribed.  We talked about the importance of weighing himself everyday since he has a hx of heart failure.  We also talked about pursed lip breathing to help with his shortness of breath.  The pt checks his blood pressure occasionally at home, but we discussed checking his blood pressure more often at home. Pt is taking his blood pressure, cholesterol, heart medication, and Eliquis/ASA as prescribed.  He checks his feet/legs everyday for any fluid.  His weight has remained stable since he started rehab.  He is using PLB in rehab and at home to help with his shortness of breath.     Expected Outcomes Patient will complete the program meeting both program and personal goals. Short term:  Checking his weight daily at home   Long term:  Being more involved in his plan of care with BP checks at homes Short term:  Check his weight daily at home  Long term:  Continue to take his medications as prescribed and check his blood pressure daily              Core Components/Risk Factors/Patient Goals at Discharge (Final Review):   Goals and Risk Factor Review - 04/05/23  1402       Core Components/Risk Factors/Patient Goals Review   Personal Goals Review Weight Management/Obesity;Heart Failure;Hypertension;Improve shortness of breath with ADL's    Review Pt is taking his blood pressure, cholesterol, heart medication, and Eliquis/ASA as prescribed.  He checks his feet/legs everyday for any fluid.  His weight has remained stable since he started rehab.  He is using PLB in rehab and at home to help with his shortness of breath.    Expected Outcomes Short term:  Check his weight daily at home  Long term:  Continue to take his medications as prescribed and check his blood pressure daily             ITP Comments:  ITP Comments     Row Name 02/14/23 1600 03/14/23 1511 04/11/23 1520       ITP Comments 30 day review completed. ITP sent to Dr.Jehanzeb Memon, Medical Director of  Pulmonary Rehab. Continue with ITP unless changes are made by physician. 30 day review completed. ITP sent to Dr.Jehanzeb Memon, Medical Director of  Pulmonary Rehab. Continue with ITP unless changes are made by physician. 30 day review completed. ITP sent to Dr.Jehanzeb Memon, Medical Director of  Pulmonary Rehab. Continue with ITP unless changes are made by physician.              Comments: 30 day review

## 2023-04-12 ENCOUNTER — Encounter (HOSPITAL_COMMUNITY): Payer: Medicare HMO

## 2023-04-12 ENCOUNTER — Encounter (HOSPITAL_COMMUNITY)
Admission: RE | Admit: 2023-04-12 | Discharge: 2023-04-12 | Disposition: A | Discharge status: Home / Self Care | Payer: 59 | Source: Ambulatory Visit | Attending: Pulmonary Disease | Admitting: Pulmonary Disease

## 2023-04-12 DIAGNOSIS — J432 Centrilobular emphysema: Secondary | ICD-10-CM | POA: Diagnosis not present

## 2023-04-12 NOTE — Progress Notes (Signed)
Daily Session Note  Patient Details  Name: Ruben Young MRN: [id number] Date of Birth: 1951/03/26 Referring Provider:   Flowsheet Row PULMONARY REHAB OTHER RESP ORIENTATION from 01/15/2023 in [facility]  Referring Provider Dr. Vassie Loll       Encounter Date: 04/12/2023  Check In:  Session Check In - 04/12/23 1315       Check-In   Supervising physician immediately available to respond to emergencies [facility] MD immediately available    Physician(s) Dr.McDowell    Location [facility]    Staff Present Ross Ludwig, BS, Exercise Physiologist;Bulah Lurie [place] BSN, RN;Daphyne Daphine Deutscher, RN, BSN    Virtual Visit No    Medication changes reported     No    Fall or balance concerns reported    No    Tobacco Cessation No Change    Warm-up and Cool-down Performed on first and last piece of equipment    Resistance Training Performed Yes    VAD Patient? No    PAD/SET Patient? No      Pain Assessment   Currently in Pain? No/denies    Pain Score 0-No pain    Multiple Pain Sites No             Capillary Blood Glucose: No results found for this or any previous visit (from the past 24 hour(s)).    Social History   Tobacco Use  Smoking Status Former   Current packs/day: 0.00   Average packs/day: 1 pack/day for 57.8 years (57.8 ttl pk-yrs)   Types: Cigarettes   Start date: 1966   Quit date: 05/2022   Years since quitting: 0.8   Passive exposure: Past  Smokeless Tobacco Never    Goals Met:  Proper associated with RPD/PD & O2 Sat Independence with exercise equipment Using PLB without cueing & demonstrates good technique Exercise tolerated well Queuing for purse lip breathing No report of concerns or symptoms today Strength training completed today  Goals Unmet:  Not Applicable  Comments: Marland KitchenMarland KitchenPt able to follow exercise prescription today without complaint.  Will continue to monitor for progression.    Dr. Erick Blinks is Medical  Director for [facility] Pulmonary Rehab.

## 2023-04-16 ENCOUNTER — Telehealth: Payer: Self-pay | Admitting: *Deleted

## 2023-04-16 NOTE — Telephone Encounter (Signed)
Pt walked into office. He does not have working telephone. Requests his pre-op be in person instead of telephone call. I called Kim in pre-op and she advised me to send carolyn a message to change appt to in person instead of phone call. I have done so and pt is aware.

## 2023-04-17 ENCOUNTER — Encounter (HOSPITAL_COMMUNITY)
Admission: RE | Admit: 2023-04-17 | Discharge: 2023-04-17 | Disposition: A | Discharge status: Home / Self Care | Payer: 59 | Source: Ambulatory Visit | Attending: Pulmonary Disease | Admitting: Pulmonary Disease

## 2023-04-17 ENCOUNTER — Encounter (HOSPITAL_COMMUNITY): Payer: Medicare HMO

## 2023-04-17 DIAGNOSIS — J432 Centrilobular emphysema: Secondary | ICD-10-CM | POA: Diagnosis not present

## 2023-04-17 NOTE — Progress Notes (Signed)
Daily Session Note  Patient Details  Name: Ruben Young MRN: [id number] Date of Birth: 11/09/1950 Referring Provider:   Flowsheet Row PULMONARY REHAB OTHER RESP ORIENTATION from 01/15/2023 in [facility] C[facility]AC RE[facility]  Referring Provider Dr. Vassie Loll       Encounter Date: 04/17/2023  Check In:  Session Check In - 04/17/23 1330       Check-In   Supervising physician immediately available to respond to emergencies See telemetry face sheet for immediately available MD    Location [facility]    Staff Present Erskine Speed, RN;Daphyne Daphine Deutscher, RN, BSN;Jessica Hawkins, MA, RCEP, CCRP, CCET    Virtual Visit No    Medication changes reported     No    Fall or balance concerns reported    No    Tobacco Cessation No Change    Warm-up and Cool-down Performed on first and last piece of equipment    Resistance Training Performed Yes    VAD Patient? No    PAD/SET Patient? No      Pain Assessment   Currently in Pain? No/denies    Pain Score 0-No pain    Multiple Pain Sites No             Capillary Blood Glucose: No results found for this or any previous visit (from the past 24 hour(s)).    Social History   Tobacco Use  Smoking Status Former   Current packs/day: 0.00   Average packs/day: 1 pack/day for 57.8 years (57.8 ttl pk-yrs)   Types: Cigarettes   Start date: 1966   Quit date: 05/2022   Years since quitting: 0.8   Passive exposure: Past  Smokeless Tobacco Never    Goals Met:  Proper associated with RPD/PD & O2 Sat Independence with exercise equipment Using PLB without cueing & demonstrates good technique Exercise tolerated well No report of concerns or symptoms today  Goals Unmet:  Not Applicable  Comments: Pt able to follow exercise prescription today without complaint.  Will continue to monitor for progression.    Dr. Erick Blinks is Medical Director for [facility] Pulmonary Rehab.

## 2023-04-17 NOTE — Patient Instructions (Signed)
TRANCE TREFRY  04/17/2023     @PREFPERIOPPHARMACY @   Your procedure is scheduled on 04/20/2023.  Report to [facility] at 10:30 A.M.  Call this number if you have problems the morning of surgery:  [phone]  If you experience any cold or flu symptoms such as cough, fever, chills, shortness of breath, etc. between now and your scheduled surgery, please notify us at the above number.   Remember:   Please Follow the Diet and Prep Instructions given to you by Dr Queen Blossom office.    Last dose of Eliquis should be on 04/17/2023          Take these medicines the morning of surgery with A SIP OF WATER : Norvasc and Carvedilol    Do not wear jewelry, make-up or nail polish, including gel polish,  artificial nails, or any other type of covering on natural nails (fingers and  toes).  Do not wear lotions, powders, or perfumes, or deodorant.  Do not shave 48 hours prior to surgery.  Men may shave face and neck.  Do not bring valuables to the hospital.  [facility] is not responsible for any belongings or valuables.  Contacts, dentures or bridgework may not be worn into surgery.  Leave your suitcase in the car.  After surgery it may be brought to your room.  For patients admitted to the hospital, discharge time will be determined by your treatment team.  Patients discharged the day of surgery will not be allowed to drive home.   Name and phone number of your driver:   Family Special instructions:  N/A  Please read over the following fact sheets that you were given. Care and Recovery After Surgery  Colonoscopy, Adult A colonoscopy is a procedure to look at the entire large intestine. This procedure is done using a long, thin, flexible tube that has a camera on the end. You may have a colonoscopy: As a part of normal colorectal screening. If you have certain symptoms, such as: A low number of red blood cells in your blood (anemia). Diarrhea that does not go away. Pain in  your abdomen. Blood in your stool. A colonoscopy can help screen for and diagnose medical problems, including: An abnormal growth of cells or tissue (tumor). Abnormal growths within the lining of your intestine (polyps). Inflammation. Areas of bleeding. Tell your health care provider about: Any allergies you have. All medicines you are taking, including vitamins, herbs, eye drops, creams, and over-the-counter medicines. Any problems you or family members have had with anesthetic medicines. Any bleeding problems you have. Any surgeries you have had. Any medical conditions you have. Any problems you have had with having bowel movements. Whether you are pregnant or may be pregnant. What are the risks? Generally, this is a safe procedure. However, problems may occur, including: Bleeding. Damage to your intestine. Allergic reactions to medicines given during the procedure. Infection. This is rare. What happens before the procedure? Eating and drinking restrictions Follow instructions from your health care provider about eating or drinking restrictions, which may include: A few days before the procedure: Follow a low-fiber diet. Avoid nuts, seeds, dried fruit, raw fruits, and vegetables. 1-3 days before the procedure: Eat only gelatin dessert or ice pops. Drink only clear liquids, such as water, clear juice, clear broth or bouillon, black coffee or tea, or clear soft drinks or sports drinks. Avoid liquids that contain red or purple dye. The day of the procedure: Do not eat solid foods. You  may continue to drink clear liquids until up to 2 hours before the procedure. Do not eat or drink anything starting 2 hours before the procedure, or within the time period that your health care provider recommends. Bowel prep If you were prescribed a bowel prep to take by mouth (orally) to clean out your colon: Take it as told by your health care provider. Starting the day before your procedure, you  will need to drink a large amount of liquid medicine. The liquid will cause you to have many bowel movements of loose stool until your stool becomes almost clear or light green. If your skin or the opening between the buttocks (anus) gets irritated from diarrhea, you may relieve the irritation using: Wipes with medicine in them, such as adult wet wipes with aloe and vitamin E. A product to soothe skin, such as petroleum jelly. If you vomit while drinking the bowel prep: Take a break for up to 60 minutes. Begin the bowel prep again. Call your health care provider if you keep vomiting or you cannot take the bowel prep without vomiting. To clean out your colon, you may also be given: Laxative medicines. These help you have a bowel movement. Instructions for enema use. An enema is liquid medicine injected into your rectum. Medicines Ask your health care provider about: Changing or stopping your regular medicines or supplements. This is especially important if you are taking iron supplements, diabetes medicines, or blood thinners. Taking medicines such as aspirin and ibuprofen. These medicines can thin your blood. Do not take these medicines unless your health care provider tells you to take them. Taking over-the-counter medicines, vitamins, herbs, and supplements. General instructions Ask your health care provider what steps will be taken to help prevent infection. These may include washing skin with a germ-killing soap. If you will be going home right after the procedure, plan to have a responsible adult: Take you home from the hospital or clinic. You will not be allowed to drive. Care for you for the time you are told. What happens during the procedure?  An IV will be inserted into one of your veins. You will be given a medicine to make you fall asleep (general anesthetic). You will lie on your side with your knees bent. A lubricant will be put on the tube. Then the tube will be: Inserted  into your anus. Gently eased through all parts of your large intestine. Air will be sent into your colon to keep it open. This may cause some pressure or cramping. Images will be taken with the camera and will appear on a screen. A small tissue sample may be removed to be looked at under a microscope (biopsy). The tissue may be sent to a lab for testing if any signs of problems are found. If small polyps are found, they may be removed and checked for cancer cells. When the procedure is finished, the tube will be removed. The procedure may vary among health care providers and hospitals. What happens after the procedure? Your blood pressure, heart rate, breathing rate, and blood oxygen level will be monitored until you leave the hospital or clinic. You may have a small amount of blood in your stool. You may pass gas and have mild cramping or bloating in your abdomen. This is caused by the air that was used to open your colon during the exam. If you were given a sedative during the procedure, it can affect you for several hours. Do not drive or operate  machinery until your health care provider says that it is safe. It is up to you to get the results of your procedure. Ask your health care provider, or the department that is doing the procedure, when your results will be ready. Summary A colonoscopy is a procedure to look at the entire large intestine. Follow instructions from your health care provider about eating and drinking before the procedure. If you were prescribed an oral bowel prep to clean out your colon, take it as told by your health care provider. During the colonoscopy, a flexible tube with a camera on its end is inserted into the anus and then passed into all parts of the large intestine. This information is not intended to replace advice given to you by your health care provider. Make sure you discuss any questions you have with your health care provider. Document Revised: 08/29/2022  Document Reviewed: 03/09/2021 Elsevier Patient Education  2024 [employer]  Monitored Anesthesia Care Anesthesia refers to the techniques, procedures, and medicines that help a person stay safe and comfortable during surgery. Monitored anesthesia care, or sedation, is one type of anesthesia. You may have sedation if you do not need to be asleep for your procedure. Procedures that use sedation may include: Surgery to remove cataracts from your eyes. A dental procedure. A biopsy. This is when a tissue sample is removed and looked at under a microscope. You will be watched closely during your procedure. Your level of sedation or type of anesthesia may be changed to fit your needs. Tell a health care provider about: Any allergies you have. All medicines you are taking, including vitamins, herbs, eye drops, creams, and over-the-counter medicines. Any problems you or family members have had with anesthesia. Any bleeding problems you have. Any surgeries you have had. Any medical conditions or illnesses you have. This includes sleep apnea, cough, fever, or the flu. Whether you are pregnant or may be pregnant. Whether you use cigarettes, alcohol, or drugs. Any use of steroids, whether by mouth or as a cream. What are the risks? Your health care provider will talk with you about risks. These may include: Getting too much medicine (oversedation). Nausea. Allergic reactions to medicines. Trouble breathing. If this happens, a breathing tube may be used to help you breathe. It will be removed when you are awake and breathing on your own. Heart trouble. Lung trouble. Confusion that gets better with time (emergence delirium). What happens before the procedure? When to stop eating and drinking Follow instructions from your health care provider about what you may eat and drink. These may include: 8 hours before your procedure Stop eating most foods. Do not eat meat, fried foods, or fatty foods. Eat  only light foods, such as toast or crackers. All liquids are okay except energy drinks and alcohol. 6 hours before your procedure Stop eating. Drink only clear liquids, such as water, clear fruit juice, black coffee, plain tea, and sports drinks. Do not drink energy drinks or alcohol. 2 hours before your procedure Stop drinking all liquids. You may be allowed to take medicines with small sips of water. If you do not follow your health care provider's instructions, your procedure may be delayed or canceled. Medicines Ask your health care provider about: Changing or stopping your regular medicines. These include any diabetes medicines or blood thinners you take. Taking medicines such as aspirin and ibuprofen. These medicines can thin your blood. Do not take them unless your health care provider tells you to. Taking over-the-counter  medicines, vitamins, herbs, and supplements. Testing You may have an exam or testing. You may have a blood or urine sample taken. General instructions Do not use any products that contain nicotine or tobacco for at least 4 weeks before the procedure. These products include cigarettes, chewing tobacco, and vaping devices, such as e-cigarettes. If you need help quitting, ask your health care provider. If you will be going home right after the procedure, plan to have a responsible adult: Take you home from the hospital or clinic. You will not be allowed to drive. Care for you for the time you are told. What happens during the procedure?  Your blood pressure, heart rate, breathing, level of pain, and blood oxygen level will be monitored. An IV will be inserted into one of your veins. You may be given: A sedative. This helps you relax. Anesthesia. This will: Numb certain areas of your body. Make you fall asleep for surgery. You will be given medicines as needed to keep you comfortable. The more medicine you are given, the deeper your level of sedation will be.  Your level of sedation may be changed to fit your needs. There are three levels of sedation: Mild sedation. At this level, you may feel awake and relaxed. You will be able to follow directions. Moderate sedation. At this level, you will be sleepy. You may not remember the procedure. Deep sedation. At this level, you will be asleep. You will not remember the procedure. How you get the medicines will depend on your age and the procedure. They may be given as: A pill. This may be taken by mouth (orally) or inserted into the rectum. An injection. This may be into a vein or muscle. A spray through the nose. After your procedure is over, the medicine will be stopped. The procedure may vary among health care providers and hospitals. What happens after the procedure? Your blood pressure, heart rate, breathing rate, and blood oxygen level will be monitored until you leave the hospital or clinic. You may feel sleepy, clumsy, or nauseous. You may not remember what happened during or after the procedure. Sedation can affect you for several hours. Do not drive or use machinery until your health care provider says that it is safe. This information is not intended to replace advice given to you by your health care provider. Make sure you discuss any questions you have with your health care provider. Document Revised: 12/12/2021 Document Reviewed: 12/12/2021 Elsevier Patient Education  2024 [employer].

## 2023-04-18 ENCOUNTER — Encounter (HOSPITAL_COMMUNITY): Payer: Self-pay

## 2023-04-18 ENCOUNTER — Encounter (HOSPITAL_COMMUNITY): Payer: 59

## 2023-04-18 ENCOUNTER — Encounter (HOSPITAL_COMMUNITY)
Admission: RE | Admit: 2023-04-18 | Discharge: 2023-04-18 | Disposition: A | Discharge status: Home / Self Care | Payer: 59 | Source: Ambulatory Visit | Attending: Internal Medicine | Admitting: Internal Medicine

## 2023-04-18 ENCOUNTER — Other Ambulatory Visit: Payer: Self-pay

## 2023-04-19 ENCOUNTER — Encounter (HOSPITAL_COMMUNITY): Payer: 59

## 2023-04-19 ENCOUNTER — Encounter (HOSPITAL_COMMUNITY): Payer: Medicare HMO

## 2023-04-20 ENCOUNTER — Ambulatory Visit (HOSPITAL_COMMUNITY)
Admission: RE | Admit: 2023-04-20 | Discharge: 2023-04-20 | Disposition: A | Discharge status: Home / Self Care | Payer: 59 | Attending: Internal Medicine | Admitting: Internal Medicine

## 2023-04-20 ENCOUNTER — Encounter (HOSPITAL_COMMUNITY)
Admission: RE | Disposition: A | Discharge status: Home / Self Care | Payer: Self-pay | Source: Home / Self Care | Attending: Internal Medicine

## 2023-04-20 ENCOUNTER — Ambulatory Visit (HOSPITAL_BASED_OUTPATIENT_CLINIC_OR_DEPARTMENT_OTHER): Payer: 59 | Admitting: Anesthesiology

## 2023-04-20 ENCOUNTER — Encounter (HOSPITAL_COMMUNITY): Payer: Self-pay

## 2023-04-20 ENCOUNTER — Ambulatory Visit (HOSPITAL_COMMUNITY): Payer: 59 | Admitting: Anesthesiology

## 2023-04-20 DIAGNOSIS — K648 Other hemorrhoids: Secondary | ICD-10-CM | POA: Diagnosis not present

## 2023-04-20 DIAGNOSIS — K5730 Diverticulosis of large intestine without perforation or abscess without bleeding: Secondary | ICD-10-CM | POA: Diagnosis not present

## 2023-04-20 DIAGNOSIS — Z1211 Encounter for screening for malignant neoplasm of colon: Secondary | ICD-10-CM | POA: Insufficient documentation

## 2023-04-20 DIAGNOSIS — J449 Chronic obstructive pulmonary disease, unspecified: Secondary | ICD-10-CM | POA: Diagnosis not present

## 2023-04-20 DIAGNOSIS — I252 Old myocardial infarction: Secondary | ICD-10-CM | POA: Diagnosis not present

## 2023-04-20 DIAGNOSIS — I2510 Atherosclerotic heart disease of native coronary artery without angina pectoris: Secondary | ICD-10-CM | POA: Insufficient documentation

## 2023-04-20 DIAGNOSIS — Z87891 Personal history of nicotine dependence: Secondary | ICD-10-CM | POA: Diagnosis not present

## 2023-04-20 DIAGNOSIS — D120 Benign neoplasm of cecum: Secondary | ICD-10-CM

## 2023-04-20 DIAGNOSIS — Z955 Presence of coronary angioplasty implant and graft: Secondary | ICD-10-CM | POA: Diagnosis not present

## 2023-04-20 DIAGNOSIS — I10 Essential (primary) hypertension: Secondary | ICD-10-CM | POA: Diagnosis not present

## 2023-04-20 DIAGNOSIS — K649 Unspecified hemorrhoids: Secondary | ICD-10-CM

## 2023-04-20 DIAGNOSIS — K635 Polyp of colon: Secondary | ICD-10-CM | POA: Insufficient documentation

## 2023-04-20 DIAGNOSIS — Z86010 Personal history of colonic polyps: Secondary | ICD-10-CM

## 2023-04-20 DIAGNOSIS — D123 Benign neoplasm of transverse colon: Secondary | ICD-10-CM | POA: Diagnosis not present

## 2023-04-20 DIAGNOSIS — K644 Residual hemorrhoidal skin tags: Secondary | ICD-10-CM | POA: Insufficient documentation

## 2023-04-20 HISTORY — PX: COLONOSCOPY WITH PROPOFOL: SHX5780

## 2023-04-20 HISTORY — PX: POLYPECTOMY: SHX5525

## 2023-04-20 SURGERY — COLONOSCOPY WITH PROPOFOL
Anesthesia: General

## 2023-04-20 MED ORDER — LACTATED RINGERS IV SOLN: INTRAVENOUS | Status: DC

## 2023-04-20 MED ORDER — PHENYLEPHRINE 80 MCG/ML (10ML) SYRINGE FOR IV PUSH (FOR BLOOD PRESSURE SUPPORT)
PREFILLED_SYRINGE | INTRAVENOUS | Status: DC | PRN
Start: 2023-04-20 — End: 2023-04-20
  Administered 2023-04-20 (×2): 160 ug via INTRAVENOUS

## 2023-04-20 MED ORDER — PROPOFOL 10 MG/ML IV BOLUS
INTRAVENOUS | Status: DC | PRN
  Administered 2023-04-20: 100 mg via INTRAVENOUS

## 2023-04-20 MED ORDER — PROPOFOL 500 MG/50ML IV EMUL
INTRAVENOUS | Status: DC | PRN
  Administered 2023-04-20: 150 ug/kg/min via INTRAVENOUS

## 2023-04-20 MED ORDER — EPHEDRINE SULFATE-NACL 50-0.9 MG/10ML-% IV SOSY
PREFILLED_SYRINGE | INTRAVENOUS | Status: DC | PRN
Start: 2023-04-20 — End: 2023-04-20
  Administered 2023-04-20: 10 mg via INTRAVENOUS

## 2023-04-20 MED ORDER — LIDOCAINE HCL (CARDIAC) PF 100 MG/5ML IV SOSY
PREFILLED_SYRINGE | INTRAVENOUS | Status: DC | PRN
  Administered 2023-04-20: 80 mg via INTRAVENOUS

## 2023-04-20 MED ORDER — PHENYLEPHRINE 80 MCG/ML (10ML) SYRINGE FOR IV PUSH (FOR BLOOD PRESSURE SUPPORT)
PREFILLED_SYRINGE | INTRAVENOUS | Status: AC
  Filled 2023-04-20: qty 10

## 2023-04-20 NOTE — Anesthesia Preprocedure Evaluation (Signed)
Anesthesia Evaluation  Patient identified by MRN, date of birth, ID band Patient awake    Reviewed: Allergy & Precautions, H&P , NPO status , Patient's Chart, lab work & pertinent test results, reviewed documented beta blocker date and time   Airway Mallampati: II  TM Distance: >3 FB Neck ROM: full    Dental no notable dental hx.    Pulmonary neg pulmonary ROS, shortness of breath, COPD, former smoker   Pulmonary exam normal breath sounds clear to auscultation       Cardiovascular Exercise Tolerance: Good hypertension, + CAD, + Past MI and + Cardiac Stents   Rhythm:regular Rate:Normal     Neuro/Psych negative neurological ROS  negative psych ROS   GI/Hepatic negative GI ROS, Neg liver ROS,,,  Endo/Other  negative endocrine ROS    Renal/GU negative Renal ROS  negative genitourinary   Musculoskeletal   Abdominal   Peds  Hematology negative hematology ROS (+)   Anesthesia Other Findings   Reproductive/Obstetrics negative OB ROS                             Anesthesia Physical Anesthesia Plan  ASA: 3  Anesthesia Plan: General   Post-op Pain Management:    Induction:   PONV Risk Score and Plan: Propofol infusion  Airway Management Planned:   Additional Equipment:   Intra-op Plan:   Post-operative Plan:   Informed Consent: I have reviewed the patients History and Physical, chart, labs and discussed the procedure including the risks, benefits and alternatives for the proposed anesthesia with the patient or authorized representative who has indicated his/her understanding and acceptance.     Dental Advisory Given  Plan Discussed with: CRNA  Anesthesia Plan Comments:        Anesthesia Quick Evaluation

## 2023-04-20 NOTE — H&P (Signed)
Primary Care Physician:  Mirna Mires, MD Primary Gastroenterologist:  Dr. Marletta Lor  Pre-Procedure History & Physical: HPI:  Ruben Young is a 72 y.o. male is here for a colonoscopy to be performed for surveillance purposes, personal history of adenomatous colon polyps in 2020  Past Medical History:  Diagnosis Date   AAA (abdominal aortic aneurysm) (HCC)    DDD (degenerative disc disease), lumbar    DVT (deep venous thrombosis) (HCC)    Dyspnea    Hyperlipidemia    Hypertension    MI (myocardial infarction) (HCC)    2018   Old myocardial infarction    Pulmonary emboli (HCC)    Tobacco use disorder     Past Surgical History:  Procedure Laterality Date   COLONOSCOPY N/A 10/01/2015   Procedure: COLONOSCOPY;  Surgeon: West Bali, MD;  Location: [facility];  Service: Endoscopy;  Laterality: N/A;  245   COLONOSCOPY N/A 04/09/2019   Procedure: COLONOSCOPY;  Surgeon: West Bali, MD;  Location: [facility];  Service: Endoscopy;  Laterality: N/A;  12:00   CORONARY ANGIOPLASTY WITH STENT PLACEMENT     EXCISION MASS LOWER EXTREMETIES Left 01/28/2016   Procedure: EXCISION LEFT BUTTOCKS SINUS TRACH;  Surgeon: Ovidio Kin, MD;  Location: [facility];  Service: General;  Laterality: Left;   LEFT HEART CATHETERIZATION WITH CORONARY ANGIOGRAM N/A 09/24/2012   Procedure: LEFT HEART CATHETERIZATION WITH CORONARY ANGIOGRAM;  Surgeon: Corky Crafts, MD;  Location: [facility] CATH LAB;  Service: Cardiovascular;  Laterality: N/A;   POLYPECTOMY  04/09/2019   Procedure: POLYPECTOMY;  Surgeon: West Bali, MD;  Location: [facility];  Service: Endoscopy;;   TONSILLECTOMY      Prior to Admission medications   Medication Sig Start Date End Date Taking? Authorizing Provider  albuterol (VENTOLIN HFA) 108 (90 Base) MCG/ACT inhaler Inhale 2 puffs into the lungs every 4 (four) hours as needed for wheezing or shortness of breath. 12/05/22  Yes Oretha Milch, MD  amLODipine (NORVASC) 10 MG tablet TAKE 1  TABLET(10 MG) BY MOUTH DAILY 05/19/22  Yes Jake Bathe, MD  Ascorbic Acid (VITAMIN C PO) Take 1,000 mg by mouth every other day.   Yes [provider]  atorvastatin (LIPITOR) 40 MG tablet TAKE 1 TABLET BY MOUTH DAILY AT 6 PM 02/22/23  Yes Jake Bathe, MD  B Complex Vitamins (B COMPLEX PO) Take 1 capsule by mouth every other day.   Yes [provider]  CALCIUM PO Take 1 tablet by mouth every other day.   Yes [provider]  carvedilol (COREG) 6.25 MG tablet TAKE 1 TABLET(6.25 MG) BY MOUTH TWICE DAILY WITH A MEAL 02/22/23  Yes Jake Bathe, MD  Cholecalciferol (VITAMIN D3) 25 MCG (1000 UT) CAPS Take 1,000 Units by mouth daily.    Yes [provider]  GINSENG PO Take 1 capsule by mouth every other day.   Yes [provider]  hydrochlorothiazide (HYDRODIURIL) 25 MG tablet Take 25 mg by mouth daily. 07/18/22  Yes [provider]  hydrocortisone cream 1 % Apply 1 Application topically daily as needed for itching.   Yes [provider]  Multiple Vitamin (MULTIVITAMIN WITH MINERALS) TABS tablet Take 1 tablet by mouth daily.   Yes [provider]  POTASSIUM PO Take 1 tablet by mouth every other day.   Yes [provider]  sulfamethoxazole-trimethoprim (BACTRIM DS) 800-160 MG tablet Take 1 tablet by mouth 2 (two) times daily. 03/29/23  Yes [provider]  vitamin E 400 UNIT capsule Take 400 Units by mouth every other day.   Yes [provider]  apixaban (ELIQUIS) 5 MG TABS tablet TAKE 1 TABLET(5 MG) BY MOUTH TWICE DAILY 01/26/23   Oretha Milch, MD  aspirin EC 81 MG tablet Take 81 mg by mouth daily.    [provider]  ferrous sulfate 325 (65 FE) MG EC tablet Take 325 mg by mouth every other day.    [provider]  nitroGLYCERIN (NITROSTAT) 0.4 MG SL tablet Place 1 tablet (0.4 mg total) under the tongue every 5 (five) minutes x 3 doses as needed for chest pain. For chest pains 05/05/21    Jake Bathe, MD  Omega-3 Fatty Acids (FISH OIL) 1000 MG CAPS Take 1,000 mg by mouth every other day.    [provider]    Allergies as of 02/20/2023   (No Known Allergies)    Family History  Problem Relation Age of Onset   Cancer Mother    Heart attack Mother    Colon cancer Neg Hx     Social History   Socioeconomic History   Marital status: Single    Spouse name: Not on file   Number of children: Not on file   Years of education: Not on file   Highest education level: Not on file  Occupational History   Not on file  Tobacco Use   Smoking status: Former    Current packs/day: 0.00    Average packs/day: 1 pack/day for 57.8 years (57.8 ttl pk-yrs)    Types: Cigarettes    Start date: 52    Quit date: 05/2022    Years since quitting: 0.8    Passive exposure: Past   Smokeless tobacco: Never  Vaping Use   Vaping status: Never Used  Substance and Sexual Activity   Alcohol use: No    Comment: pt denies   Drug use: Not Currently    Comment: pt denies   Sexual activity: Not on file  Other Topics Concern   Not on file  Social History Narrative   Not on file   Social Determinants of Health   Financial Resource Strain: Not on file  Food Insecurity: No Food Insecurity (06/30/2022)   Hunger Vital Sign    Worried About Running Out of Food in the Last Year: Never true    Ran Out of Food in the Last Year: Never true  Transportation Needs: No Transportation Needs (06/30/2022)   PRAPARE - Administrator, Civil Service (Medical): No    Lack of Transportation (Non-Medical): No  Physical Activity: Not on file  Stress: Not on file  Social Connections: Not on file  Intimate Partner Violence: Not At Risk (06/30/2022)   Humiliation, Afraid, Rape, and Kick questionnaire    Fear of Current or Ex-Partner: No    Emotionally Abused: No    Physically Abused: No    Sexually Abused: No    Review of Systems: See HPI, otherwise negative ROS  Physical  Exam: Vital signs in last 24 hours: Temp:  [97.4 F (36.3 C)] 97.4 F (36.3 C) (09/20 1143) Pulse Rate:  [66] 66 (09/20 1143) Resp:  [18] 18 (09/20 1143) BP: (141)/(75) 141/75 (09/20 1143) SpO2:  [98 %] 98 % (09/20 1143) Weight:  [80.4 kg] 80.4 kg (09/20 1143)   General:   Alert,  Well-developed, well-nourished, pleasant and cooperative in NAD Head:  Normocephalic and atraumatic. Eyes:  Sclera clear, no icterus.   Conjunctiva  pink. Ears:  Normal auditory acuity. Nose:  No deformity, discharge,  or lesions. Msk:  Symmetrical without gross deformities. Normal posture. Extremities:  Without clubbing or edema. Neurologic:  Alert and  oriented x4;  grossly normal neurologically. Skin:  Intact without significant lesions or rashes. Psych:  Alert and cooperative. Normal mood and affect.  Impression/Plan: Ruben Young is here for a colonoscopy to be performed for surveillance purposes, personal history of adenomatous colon polyps in 2020  The risks of the procedure including infection, bleed, or perforation as well as benefits, limitations, alternatives and imponderables have been reviewed with the patient. Questions have been answered. All parties agreeable.

## 2023-04-20 NOTE — Transfer of Care (Addendum)
Immediate Anesthesia Transfer of Care Note  Patient: Ruben Young  Procedure(s) Performed: COLONOSCOPY WITH PROPOFOL POLYPECTOMY  Patient Location: Short Stay  Anesthesia Type:General  Level of Consciousness: drowsy and patient cooperative  Airway & Oxygen Therapy: Patient Spontanous Breathing and Patient connected to nasal cannula oxygen  Post-op Assessment: Report given to RN and Post -op Vital signs reviewed and stable  Post vital signs: Reviewed and stable  Last Vitals:  Vitals Value Taken Time  BP 104/54 04/20/23   1308  Temp 36.4 04/20/23   1308  Pulse 60 04/20/23   1308  Resp 23 04/20/23   1308  SpO2 94% 04/20/23   1308    Last Pain:  Vitals:   04/20/23 1242  TempSrc:   PainSc: 0-No pain         Complications: No notable events documented.

## 2023-04-20 NOTE — Op Note (Signed)
[facility] Patient Name: Ruben Young Procedure Date: 04/20/2023 12:32 PM MRN: [id number] Date of Birth: [birth year] Attending MD: Hennie Duos. Marletta Lor , [place], [id number] CSN: [id number] Age: 72 Admit Type: Outpatient Procedure:                Colonoscopy Indications:              Surveillance: Personal history of adenomatous                            polyps on last colonoscopy > 3 years ago Providers:                Hennie Duos. Marletta Lor, DO, Buel Ream. Thomasena Edis RN, RN,                            Elinor Parkinson, Lennice Sites Technician, Technician Referring MD:              Medicines:                See the Anesthesia note for documentation of the                            administered medications Complications:            No immediate complications. Estimated Blood Loss:     Estimated blood loss was minimal. Procedure:                Pre-Anesthesia Assessment:                           - The anesthesia plan was to use monitored                            anesthesia care (MAC).                           After obtaining informed consent, the colonoscope                            was passed under direct vision. Throughout the                            procedure, the patient's blood pressure, pulse, and                            oxygen saturations were monitored continuously. The                            [id number]-HQ190L ([id number]) scope was introduced through                            the anus and advanced to the the cecum, identified                            by appendiceal orifice and ileocecal valve. The  colonoscopy was performed without difficulty. The                            patient tolerated the procedure well. The quality                            of the bowel preparation was evaluated using the                            BBPS [facility] Bowel Preparation Scale) with scores                            of: Right Colon = 2 (minor amount of residual                             staining, small fragments of stool and/or opaque                            liquid, but mucosa seen well), Transverse Colon = 3                            (entire mucosa seen well with no residual staining,                            small fragments of stool or opaque liquid) and Left                            Colon = 2 (minor amount of residual staining, small                            fragments of stool and/or opaque liquid, but mucosa                            seen well). The total BBPS score equals 7. The                            quality of the bowel preparation was good. Scope In: 12:46:16 PM Scope Out: 1:04:08 PM Scope Withdrawal Time: 0 hours 15 minutes 48 seconds  Total Procedure Duration: 0 hours 17 minutes 52 seconds  Findings:      Hemorrhoids were found on perianal exam.      Non-bleeding internal hemorrhoids were found.      Multiple large-mouthed and small-mouthed diverticula were found in the       sigmoid colon.      A 3 mm polyp was found in the cecum. The polyp was sessile. The polyp       was removed with a cold snare. Resection and retrieval were complete.      Three sessile polyps were found in the transverse colon. The polyps were       4 to 6 mm in size. These polyps were removed with a cold snare.       Resection and retrieval were complete.      Three sessile polyps were found in the sigmoid colon. The  polyps were 4       to 5 mm in size. These polyps were removed with a cold snare. Resection       and retrieval were complete. Impression:               - Hemorrhoids found on perianal exam.                           - Non-bleeding internal hemorrhoids.                           - Diverticulosis in the sigmoid colon.                           - One 3 mm polyp in the cecum, removed with a cold                            snare. Resected and retrieved.                           - Three 4 to 6 mm polyps in the transverse colon,                             removed with a cold snare. Resected and retrieved.                           - Three 4 to 5 mm polyps in the sigmoid colon,                            removed with a cold snare. Resected and retrieved. Moderate Sedation:      Per Anesthesia Care Recommendation:           - Patient has a contact number available for                            emergencies. The signs and symptoms of potential                            delayed complications were discussed with the                            patient. Return to normal activities tomorrow.                            Written discharge instructions were provided to the                            patient.                           - Resume previous diet.                           - Continue present medications.                           -  Await pathology results.                           - Repeat colonoscopy in 3 - 5 years for                            surveillance.                           - Return to GI clinic PRN. Procedure Code(s):        --- Professional ---                           [phone], Colonoscopy, flexible; with removal of                            tumor(s), polyp(s), or other lesion(s) by snare                            technique Diagnosis Code(s):        --- Professional ---                           Z86.010, Personal history of colonic polyps                           K64.8, Other hemorrhoids                           D12.0, Benign neoplasm of cecum                           D12.3, Benign neoplasm of transverse colon (hepatic                            flexure or splenic flexure)                           D12.5, Benign neoplasm of sigmoid colon                           K57.30, Diverticulosis of large intestine without                            perforation or abscess without bleeding CPT copyright 2022 American Medical Association. All rights reserved. The codes documented in this report are preliminary and  upon coder review may  be revised to meet current compliance requirements. Hennie Duos. Marletta Lor, DO Hennie Duos. Marletta Lor, DO 04/20/2023 1:10:51 PM This report has been signed electronically. Number of Addenda: 0

## 2023-04-20 NOTE — Discharge Instructions (Addendum)
  Colonoscopy Discharge Instructions  Read the instructions outlined below and refer to this sheet in the next few weeks. These discharge instructions provide you with general information on caring for yourself after you leave the hospital. Your doctor may also give you specific instructions. While your treatment has been planned according to the most current medical practices available, unavoidable complications occasionally occur.   ACTIVITY You may resume your regular activity, but move at a slower pace for the next 24 hours.  Take frequent rest periods for the next 24 hours.  Walking will help get rid of the air and reduce the bloated feeling in your belly (abdomen).  No driving for 24 hours (because of the medicine (anesthesia) used during the test).   Do not sign any important legal documents or operate any machinery for 24 hours (because of the anesthesia used during the test).  NUTRITION Drink plenty of fluids.  You may resume your normal diet as instructed by your doctor.  Begin with a light meal and progress to your normal diet. Heavy or fried foods are harder to digest and may make you feel sick to your stomach (nauseated).  Avoid alcoholic beverages for 24 hours or as instructed.  MEDICATIONS You may resume your normal medications unless your doctor tells you otherwise.  WHAT YOU CAN EXPECT TODAY Some feelings of bloating in the abdomen.  Passage of more gas than usual.  Spotting of blood in your stool or on the toilet paper.  IF YOU HAD POLYPS REMOVED DURING THE COLONOSCOPY: No aspirin products for 7 days or as instructed.  No alcohol for 7 days or as instructed.  Eat a soft diet for the next 24 hours.  FINDING OUT THE RESULTS OF YOUR TEST Not all test results are available during your visit. If your test results are not back during the visit, make an appointment with your caregiver to find out the results. Do not assume everything is normal if you have not heard from your  caregiver or the medical facility. It is important for you to follow up on all of your test results.  SEEK IMMEDIATE MEDICAL ATTENTION IF: You have more than a spotting of blood in your stool.  Your belly is swollen (abdominal distention).  You are nauseated or vomiting.  You have a temperature over 101.  You have abdominal pain or discomfort that is severe or gets worse throughout the day.   Your colonoscopy revealed 6 polyp(s) which I removed successfully. Await pathology results, my office will contact you. I recommend repeating colonoscopy in 3-5 years for surveillance purposes.   You also have diverticulosis and internal hemorrhoids. I would recommend increasing fiber in your diet or adding OTC Benefiber/Metamucil. Be sure to drink at least 4 to 6 glasses of water daily. Follow-up with GI as needed.  Okay to resume Eliquis tomorrow.   I hope you have a great rest of your week!  Hennie Duos. Marletta Lor, D.O. Gastroenterology and Hepatology [facility]

## 2023-04-21 NOTE — Anesthesia Postprocedure Evaluation (Signed)
Anesthesia Post Note  Patient: Ruben Young  Procedure(s) Performed: COLONOSCOPY WITH PROPOFOL POLYPECTOMY  Patient location during evaluation: Phase II Anesthesia Type: General Level of consciousness: awake Pain management: pain level controlled Vital Signs Assessment: post-procedure vital signs reviewed and stable Respiratory status: spontaneous breathing and respiratory function stable Cardiovascular status: blood pressure returned to baseline and stable Postop Assessment: no headache and no apparent nausea or vomiting Anesthetic complications: no Comments: Late entry   No notable events documented.   Last Vitals:  Vitals:   04/20/23 1143 04/20/23 1308  BP: (!) 141/75 (!) 104/54  Pulse: 66 61  Resp: 18 (!) 23  Temp: (!) 36.3 C (!) 36.4 C  SpO2: 98% 94%    Last Pain:  Vitals:   04/20/23 1308  TempSrc:   PainSc: Asleep                 Windell Norfolk

## 2023-04-23 LAB — SURGICAL PATHOLOGY

## 2023-04-24 ENCOUNTER — Encounter (HOSPITAL_COMMUNITY): Payer: Medicare HMO

## 2023-04-24 ENCOUNTER — Encounter (HOSPITAL_COMMUNITY): Payer: 59

## 2023-04-25 ENCOUNTER — Other Ambulatory Visit (HOSPITAL_COMMUNITY): Payer: Medicare HMO

## 2023-04-26 ENCOUNTER — Encounter (HOSPITAL_COMMUNITY)
Admission: RE | Admit: 2023-04-26 | Discharge: 2023-04-26 | Disposition: A | Discharge status: Home / Self Care | Payer: 59 | Source: Ambulatory Visit | Attending: Pulmonary Disease | Admitting: Pulmonary Disease

## 2023-04-26 ENCOUNTER — Encounter (HOSPITAL_COMMUNITY): Payer: Medicare HMO

## 2023-04-26 ENCOUNTER — Encounter (HOSPITAL_COMMUNITY): Payer: Self-pay | Admitting: *Deleted

## 2023-04-26 DIAGNOSIS — J432 Centrilobular emphysema: Secondary | ICD-10-CM

## 2023-04-26 NOTE — Progress Notes (Addendum)
Discharge Progress Report  Patient Details  Name: Ruben Young MRN: [id number] Date of Birth: 1951-06-01 Referring Provider:   Flowsheet Row PULMONARY REHAB OTHER RESP ORIENTATION from 01/15/2023 in [facility] CARDIAC REH[facility]ATION  Referring Provider Dr. Vassie Loll        Number of Visits: 16/36  Reason for Discharge:  Early Exit:  Insurance  Smoking History:  Social History   Tobacco Use  Smoking Status Former   Current packs/day: 0.00   Average packs/day: 1 pack/day for 57.8 years (57.8 ttl pk-yrs)   Types: Cigarettes   Start date: 1966   Quit date: 05/2022   Years since quitting: 0.9   Passive exposure: Past  Smokeless Tobacco Never    Diagnosis:  Centrilobular emphysema (HCC)  ADL UCSD:  Pulmonary Assessment Scores     Row Name 01/15/23 1316         ADL UCSD   ADL Phase Entry     SOB Score total 40     Rest 1     Walk 1     Stairs 3     Bath 2     Dress 2     Shop 3       CAT Score   CAT Score 21       mMRC Score   mMRC Score 3              Initial Exercise Prescription:  Initial Exercise Prescription - 01/15/23 1300       Date of Initial Exercise RX and Referring Provider   Date 01/15/23    Referring Provider Dr. Vassie Loll    Expected Discharge Date 05/22/23      Treadmill   MPH 2    Grade 0    Minutes 17      NuStep   Level 1    SPM 60    Minutes 22      Prescription Details   Frequency (times per week) 2    Duration Progress to 30 minutes of continuous aerobic without signs/symptoms of physical distress      Intensity   THRR 40-80% of Max Heartrate 59.118    Ratings of Perceived Exertion 11-13    Perceived Dyspnea 0-4      Resistance Training   Training Prescription Yes    Weight 3    Reps 10-15             Discharge Exercise Prescription (Final Exercise Prescription Changes):  Exercise Prescription Changes - 04/05/23 1400       Response to Exercise   Blood Pressure (Admit) 122/68    Blood Pressure (Exit)  122/62    Heart Rate (Admit) 62 bpm    Heart Rate (Exercise) 89 bpm    Heart Rate (Exit) 70 bpm    Oxygen Saturation (Admit) 97 %    Oxygen Saturation (Exercise) 95 %    Oxygen Saturation (Exit) 96 %    Rating of Perceived Exertion (Exercise) 13    Perceived Dyspnea (Exercise) 3    Duration Continue with 30 min of aerobic exercise without signs/symptoms of physical distress.    Intensity THRR unchanged      Progression   Progression Continue to progress workloads to maintain intensity without signs/symptoms of physical distress.      Resistance Training   Training Prescription Yes    Weight 4    Reps 10-15      Treadmill   MPH 1.8    Grade 0    Minutes  15    METs 2.38      NuStep   Level 2    SPM 70    Minutes 15    METs 1.9      Oxygen   Maintain Oxygen Saturation 88% or higher             Functional Capacity:  6 Minute Walk     Row Name 01/15/23 1337         6 Minute Walk   Phase Initial     Distance 1200 feet     Walk Time 6 minutes     # of Rest Breaks 0     MPH 2.27     METS 2.61     RPE 12     Perceived Dyspnea  13     VO2 Peak 9.15     Symptoms No     Resting HR 73 bpm     Resting BP 112/50     Resting Oxygen Saturation  96 %     Exercise Oxygen Saturation  during 6 min walk 90 %     Max Ex. HR 92 bpm     Max Ex. BP 136/60     2 Minute Post BP 118/60       Interval HR   1 Minute HR 80     2 Minute HR 81     3 Minute HR 87     4 Minute HR 92     5 Minute HR 90     6 Minute HR 92     2 Minute Post HR 76     Interval Heart Rate? Yes       Interval Oxygen   Interval Oxygen? Yes     Baseline Oxygen Saturation % 96 %     1 Minute Oxygen Saturation % 92 %     1 Minute Liters of Oxygen 0 L     2 Minute Oxygen Saturation % 92 %     2 Minute Liters of Oxygen 0 L     3 Minute Oxygen Saturation % 90 %     3 Minute Liters of Oxygen 0 L     4 Minute Oxygen Saturation % 91 %     4 Minute Liters of Oxygen 0 L     5 Minute Oxygen  Saturation % 92 %     5 Minute Liters of Oxygen 0 L     6 Minute Oxygen Saturation % 93 %     6 Minute Liters of Oxygen 0 L     2 Minute Post Oxygen Saturation % 96 %     2 Minute Post Liters of Oxygen 0 L              Psychological, QOL, Others - Outcomes: PHQ 2/9:    01/15/2023    1:13 PM  Depression screen PHQ 2/9  Decreased Interest 3  Down, Depressed, Hopeless 0  PHQ - 2 Score 3  Altered sleeping 1  Tired, decreased energy 3  Change in appetite 0  Feeling bad or failure about yourself  0  Trouble concentrating 0  Moving slowly or fidgety/restless 0  Suicidal thoughts 0  PHQ-9 Score 7  Difficult doing work/chores Not difficult at all      Nutrition & Weight - Outcomes:  Pre Biometrics - 01/15/23 1340       Pre Biometrics   Height 5\' 9"  (1.753 m)    Weight 179 lb  7.3 oz (81.4 kg)    Waist Circumference 42 inches    Hip Circumference 39 inches    Waist to Hip Ratio 1.08 %    BMI (Calculated) 26.49    Triceps Skinfold 15 mm    % Body Fat 28.2 %    Grip Strength 27.7 kg    Flexibility 0 in    Single Leg Stand 0 seconds

## 2023-04-26 NOTE — Progress Notes (Signed)
Pulmonary Individual Treatment Plan  Patient Details  Name: Ruben Young MRN: [id number] Date of Birth: 20-Dec-1950 Referring Provider:   Flowsheet Row PULMONARY REHAB OTHER RESP ORIENTATION from 01/15/2023 in [facility] CARDIAC REHABILITATION  Referring Provider Dr. Vassie Loll       Initial Encounter Date:  Flowsheet Row PULMONARY REHAB OTHER RESP ORIENTATION from 01/15/2023 in [facility] CARDIAC REHABILITATION  Date 01/15/23       Visit Diagnosis: Centrilobular emphysema (HCC)  Patient's Home Medications on Admission:   Current Outpatient Medications:    albuterol (VENTOLIN HFA) 108 (90 Base) MCG/ACT inhaler, Inhale 2 puffs into the lungs every 4 (four) hours as needed for wheezing or shortness of breath., Disp: 6.7 g, Rfl: 1   amLODipine (NORVASC) 10 MG tablet, TAKE 1 TABLET(10 MG) BY MOUTH DAILY, Disp: 90 tablet, Rfl: 3   apixaban (ELIQUIS) 5 MG TABS tablet, TAKE 1 TABLET(5 MG) BY MOUTH TWICE DAILY, Disp: 60 tablet, Rfl: 2   Ascorbic Acid (VITAMIN C PO), Take 1,000 mg by mouth every other day., Disp: , Rfl:    aspirin EC 81 MG tablet, Take 81 mg by mouth daily., Disp: , Rfl:    atorvastatin (LIPITOR) 40 MG tablet, TAKE 1 TABLET BY MOUTH DAILY AT 6 PM, Disp: 90 tablet, Rfl: 2   B Complex Vitamins (B COMPLEX PO), Take 1 capsule by mouth every other day., Disp: , Rfl:    CALCIUM PO, Take 1 tablet by mouth every other day., Disp: , Rfl:    carvedilol (COREG) 6.25 MG tablet, TAKE 1 TABLET(6.25 MG) BY MOUTH TWICE DAILY WITH A MEAL, Disp: 180 tablet, Rfl: 2   Cholecalciferol (VITAMIN D3) 25 MCG (1000 UT) CAPS, Take 1,000 Units by mouth daily. , Disp: , Rfl:    ferrous sulfate 325 (65 FE) MG EC tablet, Take 325 mg by mouth every other day., Disp: , Rfl:    GINSENG PO, Take 1 capsule by mouth every other day., Disp: , Rfl:    hydrochlorothiazide (HYDRODIURIL) 25 MG tablet, Take 25 mg by mouth daily., Disp: , Rfl:    hydrocortisone cream 1 %, Apply 1 Application topically daily as needed  for itching., Disp: , Rfl:    Multiple Vitamin (MULTIVITAMIN WITH MINERALS) TABS tablet, Take 1 tablet by mouth daily., Disp: , Rfl:    nitroGLYCERIN (NITROSTAT) 0.4 MG SL tablet, Place 1 tablet (0.4 mg total) under the tongue every 5 (five) minutes x 3 doses as needed for chest pain. For chest pains, Disp: 25 tablet, Rfl: 0   Omega-3 Fatty Acids (FISH OIL) 1000 MG CAPS, Take 1,000 mg by mouth every other day., Disp: , Rfl:    POTASSIUM PO, Take 1 tablet by mouth every other day., Disp: , Rfl:    sulfamethoxazole-trimethoprim (BACTRIM DS) 800-160 MG tablet, Take 1 tablet by mouth 2 (two) times daily., Disp: , Rfl:    vitamin E 400 UNIT capsule, Take 400 Units by mouth every other day., Disp: , Rfl:   Past Medical History: Past Medical History:  Diagnosis Date   AAA (abdominal aortic aneurysm) (HCC)    DDD (degenerative disc disease), lumbar    DVT (deep venous thrombosis) (HCC)    Dyspnea    Hyperlipidemia    Hypertension    MI (myocardial infarction) (HCC)    2018   Old myocardial infarction    Pulmonary emboli (HCC)    Tobacco use disorder     Tobacco Use: Social History   Tobacco Use  Smoking Status Former  Current packs/day: 0.00   Average packs/day: 1 pack/day for 57.8 years (57.8 ttl pk-yrs)   Types: Cigarettes   Start date: 56   Quit date: 05/2022   Years since quitting: 0.9   Passive exposure: Past  Smokeless Tobacco Never    Labs: Review Flowsheet  More data exists      Latest Ref Rng & Units 12/15/2011 09/24/2012 09/25/2012 04/14/2014 06/30/2022  Labs for ITP Cardiac and Pulmonary Rehab  Cholestrol 0 - 200 mg/dL - - 202  542  -  LDL (calc) 0 - 99 mg/dL - - 81  63  -  HDL-C >70.62 mg/dL - - 34  37.62  -  Trlycerides 0.0 - 149.0 mg/dL - - 91  83.1  -  PH, Arterial 7.35 - 7.45 - - - - 7.5   PCO2 arterial 32 - 48 mmHg - - - - 38   Bicarbonate 20.0 - 28.0 mmol/L - - - - 29.7   TCO2 0 - 100 mmol/L 28  24  - - -  O2 Saturation % - - - - 84.7     Details             Capillary Blood Glucose: No results found for: "GLUCAP"   Pulmonary Assessment Scores:  Pulmonary Assessment Scores     Row Name 01/15/23 1316         ADL UCSD   ADL Phase Entry     SOB Score total 40     Rest 1     Walk 1     Stairs 3     Bath 2     Dress 2     Shop 3       CAT Score   CAT Score 21       mMRC Score   mMRC Score 3             UCSD: Self-administered rating of dyspnea associated with activities of daily living (ADLs) 6-point scale (0 = "not at all" to 5 = "maximal or unable to do because of breathlessness")  Scoring Scores range from 0 to 120.  Minimally important difference is 5 units  CAT: CAT can identify the health impairment of COPD patients and is better correlated with disease progression.  CAT has a scoring range of zero to 40. The CAT score is classified into four groups of low (less than 10), medium (10 - 20), high (21-30) and very high (31-40) based on the impact level of disease on health status. A CAT score over 10 suggests significant symptoms.  A worsening CAT score could be explained by an exacerbation, poor medication adherence, poor inhaler technique, or progression of COPD or comorbid conditions.  CAT MCID is 2 points  mMRC: mMRC (Modified Medical Research Council) Dyspnea Scale is used to assess the degree of baseline functional disability in patients of respiratory disease due to dyspnea. No minimal important difference is established. A decrease in score of 1 point or greater is considered a positive change.   Pulmonary Function Assessment:   Exercise Target Goals: Exercise Program Goal: Individual exercise prescription set using results from initial 6 min walk test and THRR while considering  patient's activity barriers and safety.   Exercise Prescription Goal: Initial exercise prescription builds to 30-45 minutes a day of aerobic activity, 2-3 days per week.  Home exercise guidelines will be given to patient  during program as part of exercise prescription that the participant will acknowledge.  Activity Barriers & Risk  Stratification:  Activity Barriers & Cardiac Risk Stratification - 01/15/23 1243       Activity Barriers & Cardiac Risk Stratification   Activity Barriers Arthritis;Back Problems;Deconditioning;Shortness of Breath    Cardiac Risk Stratification High             6 Minute Walk:  6 Minute Walk     Row Name 01/15/23 1337         6 Minute Walk   Phase Initial     Distance 1200 feet     Walk Time 6 minutes     # of Rest Breaks 0     MPH 2.27     METS 2.61     RPE 12     Perceived Dyspnea  13     VO2 Peak 9.15     Symptoms No     Resting HR 73 bpm     Resting BP 112/50     Resting Oxygen Saturation  96 %     Exercise Oxygen Saturation  during 6 min walk 90 %     Max Ex. HR 92 bpm     Max Ex. BP 136/60     2 Minute Post BP 118/60       Interval HR   1 Minute HR 80     2 Minute HR 81     3 Minute HR 87     4 Minute HR 92     5 Minute HR 90     6 Minute HR 92     2 Minute Post HR 76     Interval Heart Rate? Yes       Interval Oxygen   Interval Oxygen? Yes     Baseline Oxygen Saturation % 96 %     1 Minute Oxygen Saturation % 92 %     1 Minute Liters of Oxygen 0 L     2 Minute Oxygen Saturation % 92 %     2 Minute Liters of Oxygen 0 L     3 Minute Oxygen Saturation % 90 %     3 Minute Liters of Oxygen 0 L     4 Minute Oxygen Saturation % 91 %     4 Minute Liters of Oxygen 0 L     5 Minute Oxygen Saturation % 92 %     5 Minute Liters of Oxygen 0 L     6 Minute Oxygen Saturation % 93 %     6 Minute Liters of Oxygen 0 L     2 Minute Post Oxygen Saturation % 96 %     2 Minute Post Liters of Oxygen 0 L              Oxygen Initial Assessment:  Oxygen Initial Assessment - 01/15/23 1316       Initial 6 min Walk   Oxygen Used None             Oxygen Re-Evaluation:  Oxygen Re-Evaluation     Row Name 01/15/23 1316 03/01/23 1413 03/02/23  1235 04/05/23 1341       Program Oxygen Prescription   Program Oxygen Prescription -- None -- None      Home Oxygen   Home Oxygen Device -- None -- None    Sleep Oxygen Prescription -- None -- None    Home Exercise Oxygen Prescription -- None -- None    Home Resting Oxygen Prescription -- None -- None      Goals/Expected  Outcomes   Short Term Goals -- To learn and understand importance of monitoring SPO2 with pulse oximeter and demonstrate accurate use of the pulse oximeter.;To learn and understand importance of maintaining oxygen saturations>88%;To learn and demonstrate proper pursed lip breathing techniques or other breathing techniques. ;To learn and demonstrate proper use of respiratory medications To learn and demonstrate proper pursed lip breathing techniques or other breathing techniques.  To learn and understand importance of maintaining oxygen saturations>88%;To learn and demonstrate proper pursed lip breathing techniques or other breathing techniques. ;To learn and demonstrate proper use of respiratory medications;To learn and understand importance of monitoring SPO2 with pulse oximeter and demonstrate accurate use of the pulse oximeter.    Long  Term Goals -- Verbalizes importance of monitoring SPO2 with pulse oximeter and return demonstration;Maintenance of O2 saturations>88%;Exhibits proper breathing techniques, such as pursed lip breathing or other method taught during program session;Compliance with respiratory medication Exhibits proper breathing techniques, such as pursed lip breathing or other method taught during program session Verbalizes importance of monitoring SPO2 with pulse oximeter and return demonstration;Maintenance of O2 saturations>88%;Exhibits proper breathing techniques, such as pursed lip breathing or other method taught during program session;Compliance with respiratory medication;Demonstrates proper use of MDI's    Comments -- Pt has no home 02 or CPAP.  He uses his  inhaler as needed.  Class education today on importance of PLB. Diaphragmatic and PLB breathing explained and performed with patient. Patient has a better understanding of how to do these exercises to help with breathing performance and relaxation. Patient performed breathing techniques adequately and to practice further at home. Pt does not wear any oxygen, but he has an albuteral inhaler that he uses when needed.  He is practicing pursed lip breathing in class and is also trying to utilize PLB at home.  We discussed the pt getting a pulse ox and checking his oxygen level at home.  He states that he is willing to do this and understands that his oxygen level should be 88% or higher.    Goals/Expected Outcomes -- Short term:  Pt will become more proficient with PLB   Long term:  Pt will utilize PLB in everyday life Short: practice PLB and diaphragmatic breathing at home. Long: Use PLB and diaphragmatic breathing independently Short term:  Pt will purchase a pulse ox and check his oxygen level at home  Long term:  Continue to use PLB and take his medications as prescribed             Oxygen Discharge (Final Oxygen Re-Evaluation):  Oxygen Re-Evaluation - 04/05/23 1341       Program Oxygen Prescription   Program Oxygen Prescription None      Home Oxygen   Home Oxygen Device None    Sleep Oxygen Prescription None    Home Exercise Oxygen Prescription None    Home Resting Oxygen Prescription None      Goals/Expected Outcomes   Short Term Goals To learn and understand importance of maintaining oxygen saturations>88%;To learn and demonstrate proper pursed lip breathing techniques or other breathing techniques. ;To learn and demonstrate proper use of respiratory medications;To learn and understand importance of monitoring SPO2 with pulse oximeter and demonstrate accurate use of the pulse oximeter.    Long  Term Goals Verbalizes importance of monitoring SPO2 with pulse oximeter and return  demonstration;Maintenance of O2 saturations>88%;Exhibits proper breathing techniques, such as pursed lip breathing or other method taught during program session;Compliance with respiratory medication;Demonstrates proper use of MDI's  Comments Pt does not wear any oxygen, but he has an albuteral inhaler that he uses when needed.  He is practicing pursed lip breathing in class and is also trying to utilize PLB at home.  We discussed the pt getting a pulse ox and checking his oxygen level at home.  He states that he is willing to do this and understands that his oxygen level should be 88% or higher.    Goals/Expected Outcomes Short term:  Pt will purchase a pulse ox and check his oxygen level at home  Long term:  Continue to use PLB and take his medications as prescribed             Initial Exercise Prescription:  Initial Exercise Prescription - 01/15/23 1300       Date of Initial Exercise RX and Referring Provider   Date 01/15/23    Referring Provider Dr. Vassie Loll    Expected Discharge Date 05/22/23      Treadmill   MPH 2    Grade 0    Minutes 17      NuStep   Level 1    SPM 60    Minutes 22      Prescription Details   Frequency (times per week) 2    Duration Progress to 30 minutes of continuous aerobic without signs/symptoms of physical distress      Intensity   THRR 40-80% of Max Heartrate 59.118    Ratings of Perceived Exertion 11-13    Perceived Dyspnea 0-4      Resistance Training   Training Prescription Yes    Weight 3    Reps 10-15             Perform Capillary Blood Glucose checks as needed.  Exercise Prescription Changes:   Exercise Prescription Changes     Row Name 01/30/23 1400 02/27/23 1400 03/13/23 1400 04/05/23 1400       Response to Exercise   Blood Pressure (Admit) 116/60 138/76 138/78 122/68    Blood Pressure (Exercise) 130/60 130/60 -- --    Blood Pressure (Exit) 126/64 106/60 128/72 122/62    Heart Rate (Admit) 69 bpm 68 bpm 62 bpm 62 bpm     Heart Rate (Exercise) 106 bpm 89 bpm 97 bpm 89 bpm    Heart Rate (Exit) 75 bpm 76 bpm 72 bpm 70 bpm    Oxygen Saturation (Admit) 94 % 94 % 95 % 97 %    Oxygen Saturation (Exercise) 92 % 93 % 93 % 95 %    Oxygen Saturation (Exit) 97 % 97 % 97 % 96 %    Rating of Perceived Exertion (Exercise) 12 12 11 13     Perceived Dyspnea (Exercise) 12 2 2 3     Duration Continue with 30 min of aerobic exercise without signs/symptoms of physical distress. Continue with 30 min of aerobic exercise without signs/symptoms of physical distress. Continue with 30 min of aerobic exercise without signs/symptoms of physical distress. Continue with 30 min of aerobic exercise without signs/symptoms of physical distress.    Intensity THRR unchanged THRR unchanged THRR unchanged THRR unchanged      Progression   Progression Continue to progress workloads to maintain intensity without signs/symptoms of physical distress. Continue to progress workloads to maintain intensity without signs/symptoms of physical distress. Continue to progress workloads to maintain intensity without signs/symptoms of physical distress. Continue to progress workloads to maintain intensity without signs/symptoms of physical distress.      Paramedic  Prescription Yes Yes Yes Yes    Weight 3 4 4 4     Reps 10-15 10-15 10-15 10-15    Time 10 Minutes -- -- --      Treadmill   MPH 1.7 1.6 2 1.8    Grade 0 0 0 0    Minutes 17 15 15 15     METs 2.3 2.23 2.53 2.38      NuStep   Level 1 1 1 2     SPM 59 73 77 70    Minutes 22 15 15 15     METs 1.8 1.8 1.9 1.9      Oxygen   Maintain Oxygen Saturation -- 88% or higher 88% or higher 88% or higher             Exercise Comments:   Exercise Goals and Review:   Exercise Goals     Row Name 01/15/23 1340             Exercise Goals   Increase Physical Activity Yes       Intervention Provide advice, education, support and counseling about physical activity/exercise  needs.;Develop an individualized exercise prescription for aerobic and resistive training based on initial evaluation findings, risk stratification, comorbidities and participant's personal goals.       Expected Outcomes Short Term: Attend rehab on a regular basis to increase amount of physical activity.;Long Term: Add in home exercise to make exercise part of routine and to increase amount of physical activity.;Long Term: Exercising regularly at least 3-5 days a week.       Increase Strength and Stamina Yes       Intervention Provide advice, education, support and counseling about physical activity/exercise needs.;Develop an individualized exercise prescription for aerobic and resistive training based on initial evaluation findings, risk stratification, comorbidities and participant's personal goals.       Expected Outcomes Short Term: Increase workloads from initial exercise prescription for resistance, speed, and METs.;Long Term: Improve cardiorespiratory fitness, muscular endurance and strength as measured by increased METs and functional capacity ( );Short Term: Perform resistance training exercises routinely during rehab and add in resistance training at home       Able to understand and use rate of perceived exertion (RPE) scale Yes       Intervention Provide education and explanation on how to use RPE scale       Expected Outcomes Short Term: Able to use RPE daily in rehab to express subjective intensity level;Long Term:  Able to use RPE to guide intensity level when exercising independently       Able to understand and use Dyspnea scale Yes       Intervention Provide education and explanation on how to use Dyspnea scale       Expected Outcomes Short Term: Able to use Dyspnea scale daily in rehab to express subjective sense of shortness of breath during exertion;Long Term: Able to use Dyspnea scale to guide intensity level when exercising independently       Knowledge and understanding of  Target Heart Rate Range (THRR) Yes       Intervention Provide education and explanation of THRR including how the numbers were predicted and where they are located for reference       Expected Outcomes Short Term: Able to state/look up THRR;Long Term: Able to use THRR to govern intensity when exercising independently;Short Term: Able to use daily as guideline for intensity in rehab       Understanding of Exercise Prescription Yes  Intervention Provide education, explanation, and written materials on patient's individual exercise prescription       Expected Outcomes Short Term: Able to explain program exercise prescription;Long Term: Able to explain home exercise prescription to exercise independently                Exercise Goals Re-Evaluation :  Exercise Goals Re-Evaluation     Row Name 02/28/23 1307 03/01/23 1347 03/20/23 0959 04/05/23 1335       Exercise Goal Re-Evaluation   Exercise Goals Review -- Increase Physical Activity;Increase Strength and Stamina;Understanding of Exercise Prescription Increase Physical Activity;Understanding of Exercise Prescription;Increase Strength and Stamina Increase Physical Activity;Increase Strength and Stamina;Understanding of Exercise Prescription    Comments Pt has not increased his levles since starting rehab. He continues to exercise at an RPE of 12-13. Will continue to monitor and progress as able, Pt has not been feeling any stronger since starting rehab.  Pt has increased his speed on the treadmill and states that he feels ok, maybe just a little more out of breath.  He is not currently do any exercise at home.  We will continue to monitor his progress. Pt has increased his speed on the treadmill to 2.0 but has not increased his level on the NuStep. He is still at level 1 on the stepper. Will continue to monitor and progress as able Pt does not feel like he is stronger since starting the program.  He states that he still gets short of breath with  activity, but he has increased his speed on the treadmill and is willing to increase to level 2 today on the Nustep.  He currently does not exerise at home.    Expected Outcomes -- Short term:  Go over home exercise routine  Long term:  Start a home exercise program short: increase level on the NuStep to level 2   long: continue to attend pulmonary rehab Short term:  Pt will continue to increase his speed/levels on the equipment in rehab  Long term:  Pt will start a home exercise program and continue to attend PR             Discharge Exercise Prescription (Final Exercise Prescription Changes):  Exercise Prescription Changes - 04/05/23 1400       Response to Exercise   Blood Pressure (Admit) 122/68    Blood Pressure (Exit) 122/62    Heart Rate (Admit) 62 bpm    Heart Rate (Exercise) 89 bpm    Heart Rate (Exit) 70 bpm    Oxygen Saturation (Admit) 97 %    Oxygen Saturation (Exercise) 95 %    Oxygen Saturation (Exit) 96 %    Rating of Perceived Exertion (Exercise) 13    Perceived Dyspnea (Exercise) 3    Duration Continue with 30 min of aerobic exercise without signs/symptoms of physical distress.    Intensity THRR unchanged      Progression   Progression Continue to progress workloads to maintain intensity without signs/symptoms of physical distress.      Resistance Training   Training Prescription Yes    Weight 4    Reps 10-15      Treadmill   MPH 1.8    Grade 0    Minutes 15    METs 2.38      NuStep   Level 2    SPM 70    Minutes 15    METs 1.9      Oxygen   Maintain Oxygen Saturation 88% or higher  Nutrition:  Target Goals: Understanding of nutrition guidelines, daily intake of sodium 1500mg , cholesterol 200mg , calories 30% from fat and 7% or less from saturated fats, daily to have 5 or more servings of fruits and vegetables.  Biometrics:  Pre Biometrics - 01/15/23 1340       Pre Biometrics   Height 5\' 9"  (1.753 m)    Weight 179 lb 7.3 oz  (81.4 kg)    Waist Circumference 42 inches    Hip Circumference 39 inches    Waist to Hip Ratio 1.08 %    BMI (Calculated) 26.49    Triceps Skinfold 15 mm    % Body Fat 28.2 %    Grip Strength 27.7 kg    Flexibility 0 in    Single Leg Stand 0 seconds              Nutrition Therapy Plan and Nutrition Goals:  Nutrition Therapy & Goals - 01/15/23 1318       Nutrition Therapy   RD appointment deferred Yes      Personal Nutrition Goals   Comments Patient's diet assessment score was 109. He says he cooks his own meals. Handout provided and explained regarding healthier choices and referral to RD offered. Patient declined. He says he is happy with his diet. We will continue to monitor.      Intervention Plan   Intervention Nutrition handout(s) given to patient.    Expected Outcomes Short Term Goal: Understand basic principles of dietary content, such as calories, fat, sodium, cholesterol and nutrients.             Nutrition Assessments:  Nutrition Assessments - 01/15/23 1318       MEDFICTS Scores   Pre Score 109            MEDIFICTS Score Key: >=70 Need to make dietary changes  40-70 Heart Healthy Diet <= 40 Therapeutic Level Cholesterol Diet   Picture Your Plate Scores: <16 Unhealthy dietary pattern with much room for improvement. 41-50 Dietary pattern unlikely to meet recommendations for good health and room for improvement. 51-60 More healthful dietary pattern, with some room for improvement.  >60 Healthy dietary pattern, although there may be some specific behaviors that could be improved.    Nutrition Goals Re-Evaluation:  Nutrition Goals Re-Evaluation     Row Name 03/01/23 1400 04/05/23 1352           Goals   Nutrition Goal Heart healthy diet with adequate protein Heart healthy diet with adequate protein      Comment Pt eats 2 meals a day-breakfast and supper.  He does eat some red meats but he also likes chicken and fish (fried fish).  He  states that he does not eat a lot of salty foods.  He does eat fruits and vegetables but not that often. Pt continues to eat only 2 meals a day.  He prefers fried fish and chicken, but states that his cardiologist told him to used canola oil which he does when he fries his food.  He tries to stay away from salty foods, and he eats fruits/vegetables but not that often.      Expected Outcome Short term:  Adding more fruits/veggies to his diet    Long term:  Continue to work on healthy eating habits such as eating less fried foods Short term:    Try to eat more baked vs fried foods          Long term:  Continue to  work on healthy eating habits and getting adequate fruits/vegetales in his diet               Nutrition Goals Discharge (Final Nutrition Goals Re-Evaluation):  Nutrition Goals Re-Evaluation - 04/05/23 1352       Goals   Nutrition Goal Heart healthy diet with adequate protein    Comment Pt continues to eat only 2 meals a day.  He prefers fried fish and chicken, but states that his cardiologist told him to used canola oil which he does when he fries his food.  He tries to stay away from salty foods, and he eats fruits/vegetables but not that often.    Expected Outcome Short term:    Try to eat more baked vs fried foods          Long term:  Continue to work on healthy eating habits and getting adequate fruits/vegetales in his diet             Psychosocial: Target Goals: Acknowledge presence or absence of significant depression and/or stress, maximize coping skills, provide positive support system. Participant is able to verbalize types and ability to use techniques and skills needed for reducing stress and depression.  Initial Review & Psychosocial Screening:  Initial Psych Review & Screening - 01/15/23 1322       Initial Review   Current issues with Current Sleep Concerns      Family Dynamics   Good Support System? Yes      Barriers   Psychosocial barriers to participate in  program There are no identifiable barriers or psychosocial needs.;The patient should benefit from training in stress management and relaxation.      Screening Interventions   Interventions To provide support and resources with identified psychosocial needs;Provide feedback about the scores to participant;Encouraged to exercise    Expected Outcomes Short Term goal: Identification and review with participant of any Quality of Life or Depression concerns found by scoring the questionnaire.;Short Term goal: Utilizing psychosocial counselor, staff and physician to assist with identification of specific Stressors or current issues interfering with healing process. Setting desired goal for each stressor or current issue identified.             Quality of Life Scores:  Quality of Life - 01/15/23 1341       Quality of Life   Select Quality of Life      Quality of Life Scores   Health/Function Pre 18.54 %    Socioeconomic Pre 20.67 %    Psych/Spiritual Pre 21.07 %    Family Pre 21 %    GLOBAL Pre 19.71 %            Scores of 19 and below usually indicate a poorer quality of life in these areas.  A difference of  2-3 points is a clinically meaningful difference.  A difference of 2-3 points in the total score of the Quality of Life Index has been associated with significant improvement in overall quality of life, self-image, physical symptoms, and general health in studies assessing change in quality of life.   PHQ-9: Review Flowsheet       01/15/2023  Depression screen PHQ 2/9  Decreased Interest 3  Down, Depressed, Hopeless 0  PHQ - 2 Score 3  Altered sleeping 1  Tired, decreased energy 3  Change in appetite 0  Feeling bad or failure about yourself  0  Trouble concentrating 0  Moving slowly or fidgety/restless 0  Suicidal thoughts 0  PHQ-9  Score 7  Difficult doing work/chores Not difficult at all    Details           Interpretation of Total Score  Total Score  Depression Severity:  1-4 = Minimal depression, 5-9 = Mild depression, 10-14 = Moderate depression, 15-19 = Moderately severe depression, 20-27 = Severe depression   Psychosocial Evaluation and Intervention:  Psychosocial Evaluation - 01/15/23 1323       Psychosocial Evaluation & Interventions   Interventions Stress management education;Relaxation education;Encouraged to exercise with the program and follow exercise prescription    Comments Patient has no psychosocial barriers identified at his orientation visit. His initial PHQ-9 score was 7 due to him not having any energy to do anything and his SOB. He denies any depression or anxiety. He does say he has trouble staying asleep sometimes but does not take anything. He says this is not a problem. He says he has support from his brother-in-law and some friends. He does have 3 children but says he does not talk to them often. He lives alone and uses [employer] for his transportation. He says he plans to set this up himself. He is ready to start the program hoping to gain some energy and have less SOB.    Expected Outcomes Patient will continue to have no psychosocial barriers identified.    Continue Psychosocial Services  No Follow up required             Psychosocial Re-Evaluation:  Psychosocial Re-Evaluation     Row Name 02/05/23 1422 03/01/23 1352 04/05/23 1347         Psychosocial Re-Evaluation   Current issues with None Identified Current Sleep Concerns Current Sleep Concerns     Comments Patient is new to program starting his first session.  His intial QOL score is 19.71%  and his PHQ 9 score is 7 .  Patient  was referred to PR by Dr.Rakesh with diagnosis of centrilobular emphysema.  He seems to enjoy coming to the program and  interactive with class and staff, also very talkeative and friendly.   Patient has no psychosocial issues noted at this time.  He demonstrates an interest in improving his breathing health. Pt still has sleep  concerns, since he wakes up multiple times during the night and may be up for an hour or so.  This is a chonic problem but he does not want to take any sleep aids. Pt still has sleep issues, since he wakes up multiple times during the night.  He states that he gets 2-3 hours of sleep at time.  He does not want to take any sleep aids, and most of the time he falls alseep with the tv on.     Expected Outcomes Patient will  have no psychosocial issues identified at dischage. Short term:  Pt will try to get 6 hours of sleep a day          Long term:  Pt will have no identifiable psychosocial barriers Short term: Pt will try Melatonin OTC to see if he can get more sleep                Long term:  Pt will continue to have no identifiable psychosocial barriers     Interventions Stress management education;Relaxation education;Encouraged to attend Pulmonary Rehabilitation for the exercise Stress management education;Relaxation education;Encouraged to attend Pulmonary Rehabilitation for the exercise Encouraged to attend Pulmonary Rehabilitation for the exercise;Stress management education;Relaxation education     Continue Psychosocial Services  No Follow up required Follow up required by staff Follow up required by staff              Psychosocial Discharge (Final Psychosocial Re-Evaluation):  Psychosocial Re-Evaluation - 04/05/23 1347       Psychosocial Re-Evaluation   Current issues with Current Sleep Concerns    Comments Pt still has sleep issues, since he wakes up multiple times during the night.  He states that he gets 2-3 hours of sleep at time.  He does not want to take any sleep aids, and most of the time he falls alseep with the tv on.    Expected Outcomes Short term: Pt will try Melatonin OTC to see if he can get more sleep                Long term:  Pt will continue to have no identifiable psychosocial barriers    Interventions Encouraged to attend Pulmonary Rehabilitation for the exercise;Stress  management education;Relaxation education    Continue Psychosocial Services  Follow up required by staff              Education: Education Goals: Education classes will be provided on a weekly basis, covering required topics. Participant will state understanding/return demonstration of topics presented.  Learning Barriers/Preferences:  Learning Barriers/Preferences - 01/15/23 1319       Learning Barriers/Preferences   Learning Barriers None    Learning Preferences Audio             Education Topics: How Lungs Work and Diseases: - Discuss the anatomy of the lungs and diseases that can affect the lungs, such as COPD. Flowsheet Row PULMONARY REHAB OTHER RESPIRATORY from 04/12/2023 in [place] [facility]  Date 04/05/23  Educator [facility]  Instruction Review Code 1- Verbalizes Understanding       Exercise: -Discuss the importance of exercise, FITT principles of exercise, normal and abnormal responses to exercise, and how to exercise safely.   Environmental Irritants: -Discuss types of environmental irritants and how to limit exposure to environmental irritants.   Meds/Inhalers and oxygen: - Discuss respiratory medications, definition of an inhaler and oxygen, and the proper way to use an inhaler and oxygen. Flowsheet Row PULMONARY REHAB OTHER RESPIRATORY from 04/12/2023 in [place] [facility]  Date 02/15/23  Educator handout       Energy Saving Techniques: - Discuss methods to conserve energy and decrease shortness of breath when performing activities of daily living.  Flowsheet Row PULMONARY REHAB OTHER RESPIRATORY from 04/12/2023 in [place] [facility]  Date 02/22/23  Educator handout  Instruction Review Code 1- Verbalizes Understanding       Bronchial Hygiene / Breathing Techniques: - Discuss breathing mechanics, pursed-lip breathing technique,  proper posture, effective ways to clear airways, and other functional  breathing techniques Flowsheet Row PULMONARY REHAB OTHER RESPIRATORY from 04/12/2023 in [place] [facility]  Date 03/01/23  Educator [facility]  Instruction Review Code 1- Personnel officer: - Provides group verbal and written instruction about the health risks of elevated stress, cause of high stress, and healthy ways to reduce stress. Flowsheet Row PULMONARY REHAB OTHER RESPIRATORY from 04/12/2023 in [place] [facility]  Date 03/08/23  Educator [facility]  Instruction Review Code 1- Verbalizes Understanding       Nutrition I: Fats: - Discuss the types of cholesterol, what cholesterol does to the body, and how cholesterol levels can be controlled.   Nutrition II: Labels: -Discuss the different  components of food labels and how to read food labels.   Respiratory Infections: - Discuss the signs and symptoms of respiratory infections, ways to prevent respiratory infections, and the importance of seeking medical treatment when having a respiratory infection.   Stress I: Signs and Symptoms: - Discuss the causes of stress, how stress may lead to anxiety and depression, and ways to limit stress.   Stress II: Relaxation: -Discuss relaxation techniques to limit stress.   Oxygen for Home/Travel: - Discuss how to prepare for travel when on oxygen and proper ways to transport and store oxygen to ensure safety.   Knowledge Questionnaire Score:  Knowledge Questionnaire Score - 01/15/23 1319       Knowledge Questionnaire Score   Pre Score 9/18             Core Components/Risk Factors/Patient Goals at Admission:  Personal Goals and Risk Factors at Admission - 01/15/23 1320       Core Components/Risk Factors/Patient Goals on Admission    Weight Management Obesity;Weight Maintenance    Improve shortness of breath with ADL's Yes    Intervention Provide education, individualized exercise plan and daily activity instruction to help  decrease symptoms of SOB with activities of daily living.    Expected Outcomes Short Term: Improve cardiorespiratory fitness to achieve a reduction of symptoms when performing ADLs;Long Term: Be able to perform more ADLs without symptoms or delay the onset of symptoms    Heart Failure Yes    Intervention Provide a combined exercise and nutrition program that is supplemented with education, support and counseling about heart failure. Directed toward relieving symptoms such as shortness of breath, decreased exercise tolerance, and extremity edema.    Expected Outcomes Improve functional capacity of life    Hypertension Yes    Intervention Provide education on lifestyle modifcations including regular physical activity/exercise, weight management, moderate sodium restriction and increased consumption of fresh fruit, vegetables, and low fat dairy, alcohol moderation, and smoking cessation.;Monitor prescription use compliance.    Expected Outcomes Short Term: Continued assessment and intervention until BP is < 140/66mm HG in hypertensive participants. < 130/22mm HG in hypertensive participants with diabetes, heart failure or chronic kidney disease.;Long Term: Maintenance of blood pressure at goal levels.    Personal Goal Other Yes    Personal Goal Patient wants to breathe better and have more energy to complete his ADL's.    Intervention Patient will attend PR 2 days/week with exercise and education.    Expected Outcomes Patient will complete the program meeting both personal and program goals.             Core Components/Risk Factors/Patient Goals Review:   Goals and Risk Factor Review     Row Name 02/05/23 1440 03/01/23 1406 04/05/23 1402         Core Components/Risk Factors/Patient Goals Review   Personal Goals Review Develop more efficient breathing techniques such as purse lipped breathing and diaphragmatic breathing and practicing self-pacing with activity.;Improve shortness of breath with  ADL's;Other Heart Failure;Hypertension;Weight Management/Obesity;Improve shortness of breath with ADL's Weight Management/Obesity;Heart Failure;Hypertension;Improve shortness of breath with ADL's     Review Patient is new to the prorgram completing first session.  We will help him with develping efficient breathing techniques such as purse lipped breathing and pratice self pacing with increased activity.   Tolerates exercise with no supplemental oxygen needed at this time, during exercise session his O2 sats range 92%-96%.  Exercised on the treadmill and Nu-step without any problem or difficulty.  His personal goals for the program is he wants to breath better and have more energy to do his ADL's.   We will continue to monitor his progress as he works toward meeting these goals. Pt is taking his blood pressure, cholesterol, and heart medication as prescribed.  We talked about the importance of weighing himself everyday since he has a hx of heart failure.  We also talked about pursed lip breathing to help with his shortness of breath.  The pt checks his blood pressure occasionally at home, but we discussed checking his blood pressure more often at home. Pt is taking his blood pressure, cholesterol, heart medication, and Eliquis/ASA as prescribed.  He checks his feet/legs everyday for any fluid.  His weight has remained stable since he started rehab.  He is using PLB in rehab and at home to help with his shortness of breath.     Expected Outcomes Patient will complete the program meeting both program and personal goals. Short term:  Checking his weight daily at home   Long term:  Being more involved in his plan of care with BP checks at homes Short term:  Check his weight daily at home  Long term:  Continue to take his medications as prescribed and check his blood pressure daily              Core Components/Risk Factors/Patient Goals at Discharge (Final Review):   Goals and Risk Factor Review - 04/05/23  1402       Core Components/Risk Factors/Patient Goals Review   Personal Goals Review Weight Management/Obesity;Heart Failure;Hypertension;Improve shortness of breath with ADL's    Review Pt is taking his blood pressure, cholesterol, heart medication, and Eliquis/ASA as prescribed.  He checks his feet/legs everyday for any fluid.  His weight has remained stable since he started rehab.  He is using PLB in rehab and at home to help with his shortness of breath.    Expected Outcomes Short term:  Check his weight daily at home  Long term:  Continue to take his medications as prescribed and check his blood pressure daily             ITP Comments:  ITP Comments     Row Name 02/14/23 1600 03/14/23 1511 04/11/23 1520 04/26/23 0849     ITP Comments 30 day review completed. ITP sent to Dr.Jehanzeb Memon, Medical Director of  Pulmonary Rehab. Continue with ITP unless changes are made by physician. 30 day review completed. ITP sent to Dr.Jehanzeb Memon, Medical Director of  Pulmonary Rehab. Continue with ITP unless changes are made by physician. 30 day review completed. ITP sent to Dr.Jehanzeb Memon, Medical Director of  Pulmonary Rehab. Continue with ITP unless changes are made by physician. Pt has Medicaid for insurance and time for program is up.  We will discharge him at this point.  Discharge ITP created and sent to Medical Director.             Comments: Discharge ITP

## 2023-04-30 ENCOUNTER — Encounter (HOSPITAL_COMMUNITY): Payer: Self-pay | Admitting: Internal Medicine

## 2023-05-01 ENCOUNTER — Encounter (HOSPITAL_COMMUNITY): Payer: 59

## 2023-05-01 ENCOUNTER — Encounter (HOSPITAL_COMMUNITY): Payer: Medicare HMO

## 2023-05-03 ENCOUNTER — Encounter (HOSPITAL_COMMUNITY): Payer: 59

## 2023-05-03 ENCOUNTER — Encounter (HOSPITAL_COMMUNITY): Payer: Medicare HMO

## 2023-05-08 ENCOUNTER — Encounter (HOSPITAL_COMMUNITY): Payer: Medicare HMO

## 2023-05-08 ENCOUNTER — Encounter (HOSPITAL_COMMUNITY): Payer: 59

## 2023-05-10 ENCOUNTER — Encounter (HOSPITAL_COMMUNITY): Payer: 59

## 2023-05-10 ENCOUNTER — Encounter (HOSPITAL_COMMUNITY): Payer: Medicare HMO

## 2023-05-15 ENCOUNTER — Encounter (HOSPITAL_COMMUNITY): Payer: Medicare HMO

## 2023-05-15 ENCOUNTER — Encounter (HOSPITAL_COMMUNITY): Payer: 59

## 2023-05-17 ENCOUNTER — Encounter (HOSPITAL_COMMUNITY): Payer: 59

## 2023-05-17 ENCOUNTER — Encounter (HOSPITAL_COMMUNITY): Payer: Medicare HMO

## 2023-05-22 ENCOUNTER — Encounter (HOSPITAL_COMMUNITY): Payer: Medicare HMO

## 2023-05-22 ENCOUNTER — Other Ambulatory Visit: Payer: Self-pay | Admitting: Cardiology

## 2023-05-22 ENCOUNTER — Encounter (HOSPITAL_COMMUNITY): Payer: 59

## 2023-05-24 ENCOUNTER — Encounter (HOSPITAL_COMMUNITY): Payer: Medicare HMO

## 2023-05-29 ENCOUNTER — Encounter (HOSPITAL_COMMUNITY): Payer: Medicare HMO

## 2023-05-31 ENCOUNTER — Encounter (HOSPITAL_COMMUNITY): Payer: Medicare HMO

## 2023-06-11 ENCOUNTER — Other Ambulatory Visit (HOSPITAL_COMMUNITY): Payer: Medicare HMO

## 2023-06-11 ENCOUNTER — Ambulatory Visit: Payer: Medicare HMO

## 2023-06-22 ENCOUNTER — Other Ambulatory Visit: Payer: Self-pay | Admitting: *Deleted

## 2023-06-22 DIAGNOSIS — I723 Aneurysm of iliac artery: Secondary | ICD-10-CM

## 2023-07-05 ENCOUNTER — Ambulatory Visit (HOSPITAL_COMMUNITY)
Admission: RE | Admit: 2023-07-05 | Discharge: 2023-07-05 | Disposition: A | Discharge status: Home / Self Care | Payer: 59 | Source: Ambulatory Visit | Attending: Vascular Surgery | Admitting: Vascular Surgery

## 2023-07-05 ENCOUNTER — Ambulatory Visit (INDEPENDENT_AMBULATORY_CARE_PROVIDER_SITE_OTHER): Payer: 59 | Admitting: Physician Assistant

## 2023-07-05 VITALS — BP 166/80 | HR 86 | Temp 98.1°F | Ht 66.0 in | Wt 167.5 lb

## 2023-07-05 DIAGNOSIS — I723 Aneurysm of iliac artery: Secondary | ICD-10-CM

## 2023-07-05 NOTE — Progress Notes (Signed)
HISTORY AND PHYSICAL     CC:  follow up. Requesting Provider:  Mirna Mires, MD  HPI: This is a 72 y.o. male who is here today for follow up for bilateral CIA aneurysms.    Pt was last seen 06/01/2022 by Dr. Edilia Bo and at that time, he right common iliac artery measured 2.3 cm in maximum diameter. The left common iliac artery measured 1.1 cm in maximum diameter. However previous study had shown it to be 2 cm back in 2021.  He was having some intermittent lower abdominal pain but no sudden onset of severe back or abdominal pain.  Dr. Edilia Bo did not feel his pain was related to his aneurysm.  He had quit smoking and BP was well controlled.   The pt returns today for follow up.  He states that since his last visit, he was diagnosed with PE last December.  He states he got up out of bed to go to the bathroom and was short of breath.  When he got back to bed it was even worse and it scared him and he call 911.  He is on Eliquis for this.  He still has some shortness of breath.  He is not having any new abdominal or back pain.  He denies any claudication, rest pain or non healing wounds.  He states he has started back smoking but doesn't even smoke a pack of cigarettes in a month.    The pt is on a statin for cholesterol management.    The pt is on an aspirin.    Other AC:  Eliquis The pt is on CCB, diuretic, BB for hypertension.  The pt is not on medication diabetes. Tobacco hx:  former  Pt does not have family hx of AAA.  Past Medical History:  Diagnosis Date   AAA (abdominal aortic aneurysm) (HCC)    DDD (degenerative disc disease), lumbar    DVT (deep venous thrombosis) (HCC)    Dyspnea    Hyperlipidemia    Hypertension    MI (myocardial infarction) (HCC)    2018   Old myocardial infarction    Pulmonary emboli (HCC)    Tobacco use disorder     Past Surgical History:  Procedure Laterality Date   COLONOSCOPY N/A 10/01/2015   Procedure: COLONOSCOPY;  Surgeon: West Bali, MD;   Location: [facility];  Service: Endoscopy;  Laterality: N/A;  245   COLONOSCOPY N/A 04/09/2019   Procedure: COLONOSCOPY;  Surgeon: West Bali, MD;  Location: [facility];  Service: Endoscopy;  Laterality: N/A;  12:00   COLONOSCOPY WITH PROPOFOL N/A 04/20/2023   Procedure: COLONOSCOPY WITH PROPOFOL;  Surgeon: Lanelle Bal, DO;  Location: [facility];  Service: Endoscopy;  Laterality: N/A;  1045am, asa 3   CORONARY ANGIOPLASTY WITH STENT PLACEMENT     EXCISION MASS LOWER EXTREMETIES Left 01/28/2016   Procedure: EXCISION LEFT BUTTOCKS SINUS TRACH;  Surgeon: Ovidio Kin, MD;  Location: [facility];  Service: General;  Laterality: Left;   LEFT HEART CATHETERIZATION WITH CORONARY ANGIOGRAM N/A 09/24/2012   Procedure: LEFT HEART CATHETERIZATION WITH CORONARY ANGIOGRAM;  Surgeon: Corky Crafts, MD;  Location: [facility] C[facility];  Service: Cardiovascular;  Laterality: N/A;   POLYPECTOMY  04/09/2019   Procedure: POLYPECTOMY;  Surgeon: West Bali, MD;  Location: [facility];  Service: Endoscopy;;   POLYPECTOMY  04/20/2023   Procedure: POLYPECTOMY;  Surgeon: Lanelle Bal, DO;  Location: [facility];  Service: Endoscopy;;  TONSILLECTOMY      No Known Allergies  Current Outpatient Medications  Medication Sig Dispense Refill   albuterol (VENTOLIN HFA) 108 (90 Base) MCG/ACT inhaler Inhale 2 puffs into the lungs every 4 (four) hours as needed for wheezing or shortness of breath. 6.7 g 1   amLODipine (NORVASC) 10 MG tablet TAKE 1 TABLET(10 MG) BY MOUTH DAILY 90 tablet 3   apixaban (ELIQUIS) 5 MG TABS tablet TAKE 1 TABLET(5 MG) BY MOUTH TWICE DAILY 60 tablet 2   Ascorbic Acid (VITAMIN C PO) Take 1,000 mg by mouth every other day.     aspirin EC 81 MG tablet Take 81 mg by mouth daily.     atorvastatin (LIPITOR) 40 MG tablet TAKE 1 TABLET BY MOUTH DAILY AT 6 PM 90 tablet 2   B Complex Vitamins (B COMPLEX PO) Take 1 capsule by mouth every other day.     CALCIUM PO Take 1 tablet by mouth  every other day.     carvedilol (COREG) 6.25 MG tablet TAKE 1 TABLET(6.25 MG) BY MOUTH TWICE DAILY WITH A MEAL 180 tablet 2   Cholecalciferol (VITAMIN D3) 25 MCG (1000 UT) CAPS Take 1,000 Units by mouth daily.      ferrous sulfate 325 (65 FE) MG EC tablet Take 325 mg by mouth every other day.     GINSENG PO Take 1 capsule by mouth every other day.     hydrochlorothiazide (HYDRODIURIL) 25 MG tablet Take 25 mg by mouth daily.     hydrocortisone cream 1 % Apply 1 Application topically daily as needed for itching.     Multiple Vitamin (MULTIVITAMIN WITH MINERALS) TABS tablet Take 1 tablet by mouth daily.     nitroGLYCERIN (NITROSTAT) 0.4 MG SL tablet Place 1 tablet (0.4 mg total) under the tongue every 5 (five) minutes x 3 doses as needed for chest pain. For chest pains 25 tablet 0   Omega-3 Fatty Acids (FISH OIL) 1000 MG CAPS Take 1,000 mg by mouth every other day.     POTASSIUM PO Take 1 tablet by mouth every other day.     sulfamethoxazole-trimethoprim (BACTRIM DS) 800-160 MG tablet Take 1 tablet by mouth 2 (two) times daily.     vitamin E 400 UNIT capsule Take 400 Units by mouth every other day.     No current facility-administered medications for this visit.    Family History  Problem Relation Age of Onset   Cancer Mother    Heart attack Mother    Colon cancer Neg Hx     Social History   Socioeconomic History   Marital status: Single    Spouse name: Not on file   Number of children: Not on file   Years of education: Not on file   Highest education level: Not on file  Occupational History   Not on file  Tobacco Use   Smoking status: Former    Current packs/day: 0.00    Average packs/day: 1 pack/day for 57.8 years (57.8 ttl pk-yrs)    Types: Cigarettes    Start date: 15    Quit date: 05/2022    Years since quitting: 1.0    Passive exposure: Past   Smokeless tobacco: Never  Vaping Use   Vaping status: Never Used  Substance and Sexual Activity   Alcohol use: No     Comment: pt denies   Drug use: Not Currently    Comment: pt denies   Sexual activity: Not on file  Other Topics Concern  Not on file  Social History Narrative   Not on file   Social Determinants of Health   Financial Resource Strain: Not on file  Food Insecurity: No Food Insecurity (06/30/2022)   Hunger Vital Sign    Worried About Running Out of Food in the Last Year: Never true    Ran Out of Food in the Last Year: Never true  Transportation Needs: No Transportation Needs (06/30/2022)   PRAPARE - Administrator, Civil Service (Medical): No    Lack of Transportation (Non-Medical): No  Physical Activity: Not on file  Stress: Not on file  Social Connections: Not on file  Intimate Partner Violence: Not At Risk (06/30/2022)   Humiliation, Afraid, Rape, and Kick questionnaire    Fear of Current or Ex-Partner: No    Emotionally Abused: No    Physically Abused: No    Sexually Abused: No     REVIEW OF SYSTEMS:   [X]  denotes positive finding, [ ]  denotes negative finding Cardiac  Comments:  Chest pain or chest pressure:    Shortness of breath upon exertion: x   Short of breath when lying flat:    Irregular heart rhythm:        Vascular    Pain in calf, thigh, or hip brought on by ambulation:    Pain in feet at night that wakes you up from your sleep:     Blood clot in your veins:    Leg swelling:         Pulmonary    Oxygen at home:    Productive cough:  x   Wheezing:         Neurologic    Sudden weakness in arms or legs:     Sudden numbness in arms or legs:     Sudden onset of difficulty speaking or slurred speech:    Temporary loss of vision in one eye:     Problems with dizziness:         Gastrointestinal    Blood in stool:     Vomited blood:         Genitourinary    Burning when urinating:     Blood in urine:        Psychiatric    Major depression:         Hematologic    Bleeding problems:    Problems with blood clotting too easily:         Skin    Rashes or ulcers:        Constitutional    Fever or chills:      PHYSICAL EXAMINATION:  Today's Vitals   07/05/23 1025  BP: (!) 166/80  Pulse: 86  Temp: 98.1 F (36.7 C)  TempSrc: Temporal  SpO2: 91%  Weight: 167 lb 8 oz (76 kg)  Height: 5\' 6"  (1.676 m)   Body mass index is 27.04 kg/m.   General:  WDWN in NAD; vital signs documented above Gait: Not observed HENT: WNL, normocephalic Pulmonary: normal non-labored breathing  Cardiac: regular HR, without carotid bruits Abdomen: soft, NT; aortic pulse is not palpable Skin: without rashes Vascular Exam/Pulses:  Right Left  Radial 2+ (normal) 2+ (normal)  Femoral 2+ (normal) 2+ (normal)  Popliteal Unable to palpate Unable to palpate  DP 2+ (normal) 2+ (normal)  PT 2+ (normal) 2+ (normal)   Extremities: without ischemic changes, without Gangrene , without cellulitis; without open wounds Musculoskeletal: no muscle wasting or atrophy  Neurologic: A&O X 3;  No focal weakness or paresthesias are detected Psychiatric:  The pt has Normal affect.   Non-Invasive Vascular Imaging:   AAA Arterial duplex on 07/05/2023: Abdominal Aorta Findings:  +-------------+-------+----------+----------+---------+--------+--------+  Location    AP (cm)Trans (cm)PSV (cm/s)Waveform ThrombusComments  +-------------+-------+----------+----------+---------+--------+--------+  Proximal    2.19   2.51      94        triphasic                  +-------------+-------+----------+----------+---------+--------+--------+  Mid         1.90   1.91      94        triphasic                  +-------------+-------+----------+----------+---------+--------+--------+  Distal      1.93   2.25      77        triphasic        ectatic   +-------------+-------+----------+----------+---------+--------+--------+  RT CIA Prox  2.6    2.5       118       biphasic                    +-------------+-------+----------+----------+---------+--------+--------+  RT CIA Mid                    258       biphasic                   +-------------+-------+----------+----------+---------+--------+--------+  RT CIA Distal                 158       triphasic                  +-------------+-------+----------+----------+---------+--------+--------+  LT CIA Prox  2.0    2.0       91        triphasic                  +-------------+-------+----------+----------+---------+--------+--------+  LT CIA Mid   1.9              246       biphasic                   +-------------+-------+----------+----------+---------+--------+--------+  LT CIA Distal1.7              234       biphasic                   +-------------+-------+----------+----------+---------+--------+--------+   Summary:  Abdominal Aorta:  There is evidence of abnormal dilation of the Right Common Iliac artery and Left Common Iliac artery.    Right common iliac artery aneurysm measures 2.6x2.5cm, essentially unchanged compared to prior exam at 2.2x2.4cm. Left common iliac artery aneurysm measures 2.0x2.0cm, essentially unchanged compared to prior exam at 2.2x1.9cm.   The largest aortic diameter has decreased compared to prior exam. Previous diameter measurement was 2.5 cm obtained on 06/01/22.   Previous AAA arterial duplex on 06/01/2022: Abdominal Aorta Findings:  +-----------+-------+----------+----------+--------+--------+--------+  Location  AP (cm)Trans (cm)PSV (cm/s)WaveformThrombusComments  +-----------+-------+----------+----------+--------+--------+--------+  Proximal  2.10   2.30      81        biphasic                  +-----------+-------+----------+----------+--------+--------+--------+  Mid       2.10   1.90      48  biphasic                  +-----------+-------+----------+----------+--------+--------+--------+  Distal    2.30   2.50       70        biphasic                  +-----------+-------+----------+----------+--------+--------+--------+  RT CIA Prox2.2    2.4       156                                 +-----------+-------+----------+----------+--------+--------+--------+  LT CIA Prox2.2    1.9       131       biphasic                  +-----------+-------+----------+----------+--------+--------+--------+   Summary:  Abdominal Aorta:  There is evidence of abnormal dilation of the distal aorta, measuring  2.5cm. Previous distal aorta measured 2.0cm 06/30/21.   The maximum right common iliac artery measurement is 2.4cm, which is essentially unchanged compared to prior measurement of 2.3cm 07/10/21.  Internal echoes seen within right common iliac artery, consistent with  previously documented thrombus.   Left common iliac artery measurement is 2.2cm; this is a significant  change compared to previous measurement of 1.1cm 07/10/21.    ASSESSMENT/PLAN:: 72 y.o. male here for follow up for bilateral common iliac artery aneurysms.  -pt doing well with palpable pedal pulses.  He denies any new back or abdominal pain.   -his duplex is essentially unchanged from last year.  Dr. Edilia Bo had discussed with pt that we would not electively repair these aneurysms unless they enlarge to 3.5cm.  discussed this with him and he understands. -his BP is elevated today.  He states that the nurse came out yesterday and took his pressure and it was much lower.   -discussed the importance of smoking cessation with pt.  -discussed if he develops sudden severe abdominal or back pain that he would need to call 911.   -pt will f/u in one year with aorto-iliac duplex.  He will call sooner if he has any issues before then.    Doreatha Massed, [facility] [phone]  Clinic MD:   Karin Lieu

## 2023-07-09 ENCOUNTER — Other Ambulatory Visit: Payer: Self-pay

## 2023-07-09 DIAGNOSIS — I723 Aneurysm of iliac artery: Secondary | ICD-10-CM

## 2023-07-11 ENCOUNTER — Other Ambulatory Visit (HOSPITAL_BASED_OUTPATIENT_CLINIC_OR_DEPARTMENT_OTHER): Payer: Self-pay | Admitting: Pulmonary Disease

## 2023-08-06 ENCOUNTER — Ambulatory Visit (HOSPITAL_BASED_OUTPATIENT_CLINIC_OR_DEPARTMENT_OTHER): Payer: 59 | Admitting: Pulmonary Disease

## 2023-08-28 DIAGNOSIS — I2699 Other pulmonary embolism without acute cor pulmonale: Secondary | ICD-10-CM | POA: Diagnosis not present

## 2023-08-28 DIAGNOSIS — I110 Hypertensive heart disease with heart failure: Secondary | ICD-10-CM | POA: Diagnosis not present

## 2023-08-28 DIAGNOSIS — J432 Centrilobular emphysema: Secondary | ICD-10-CM | POA: Diagnosis not present

## 2023-08-28 DIAGNOSIS — I2510 Atherosclerotic heart disease of native coronary artery without angina pectoris: Secondary | ICD-10-CM | POA: Diagnosis not present

## 2023-08-28 DIAGNOSIS — I5022 Chronic systolic (congestive) heart failure: Secondary | ICD-10-CM | POA: Diagnosis not present

## 2023-08-28 DIAGNOSIS — I723 Aneurysm of iliac artery: Secondary | ICD-10-CM | POA: Diagnosis not present

## 2023-08-28 DIAGNOSIS — R0600 Dyspnea, unspecified: Secondary | ICD-10-CM | POA: Diagnosis not present

## 2023-08-28 DIAGNOSIS — R0609 Other forms of dyspnea: Secondary | ICD-10-CM | POA: Diagnosis not present

## 2023-09-12 ENCOUNTER — Ambulatory Visit (HOSPITAL_BASED_OUTPATIENT_CLINIC_OR_DEPARTMENT_OTHER): Payer: 59 | Admitting: Pulmonary Disease

## 2023-09-12 ENCOUNTER — Encounter (HOSPITAL_BASED_OUTPATIENT_CLINIC_OR_DEPARTMENT_OTHER): Payer: Self-pay | Admitting: Pulmonary Disease

## 2023-09-12 VITALS — BP 126/82 | HR 92 | Ht 66.0 in | Wt 171.3 lb

## 2023-09-12 DIAGNOSIS — Z720 Tobacco use: Secondary | ICD-10-CM | POA: Diagnosis not present

## 2023-09-12 DIAGNOSIS — J432 Centrilobular emphysema: Secondary | ICD-10-CM

## 2023-09-12 MED ORDER — TRELEGY ELLIPTA 100-62.5-25 MCG/ACT IN AEPB: 1.0000 | INHALATION_SPRAY | Freq: Every day | RESPIRATORY_TRACT | 5 refills | Status: DC

## 2023-09-12 MED ORDER — TRELEGY ELLIPTA 100-62.5-25 MCG/ACT IN AEPB: 100.0000 ug | INHALATION_SPRAY | Freq: Every day | RESPIRATORY_TRACT | Status: AC

## 2023-09-12 NOTE — Patient Instructions (Addendum)
X Sample of Trelegy -100 once daily- rinse mouth after use  VISIT SUMMARY:  During today's visit, we discussed your ongoing shortness of breath and wheezing, which are sometimes worsened by cold weather and physical activity. We reviewed your current medications and smoking habits, and we talked about your history of pulmonary embolism (PE).  YOUR PLAN:  -CHRONIC OBSTRUCTIVE PULMONARY DISEASE (COPD): COPD is a chronic lung condition that makes it hard to breathe. We are prescribing a new inhaler called Trelegy to help control your symptoms better. Please use the sample provided and follow the instructions on how to use it properly. Remember to rinse your mouth after using the inhaler to prevent any side effects.  -PULMONARY EMBOLISM (PE): A pulmonary embolism is a blood clot in the lungs that can cause serious breathing problems. You will continue taking apixaban 5 mg twice daily to prevent another clot. It is very important to stay on this medication for life to reduce the risk of another PE.  -GENERAL HEALTH MAINTENANCE: Given your history of smoking and lung conditions, you are at a higher risk for lung cancer. We recommend enrolling in an annual lung cancer screening program and continuing to avoid smoking. We can provide resources to help you quit smoking completely.  INSTRUCTIONS:  Please schedule a follow-up appointment in 3 months to review your COPD management and anticoagulation therapy.

## 2023-09-12 NOTE — Addendum Note (Signed)
Addended by: Amada Kingfisher on: 09/12/2023 03:38 PM   Modules accepted: Orders

## 2023-09-12 NOTE — Progress Notes (Signed)
Subjective:    Patient ID: Ruben Young, male    DOB: 1950/12/24, 73 y.o.   MRN: [id number]  HPI  74 yo ex-smoker for follow-up of COPD and PE 06/2022    PMH - AAA - 2.5 cm 05/2022 CAD HFrEF -EF 50 to 55% 06/2022 Hosp 06/2022 for PE,LLE DVT   He smoked half to 1 pack/day until he quit 05/2022, about 50 pack years    Initial OV 09/2022 >> given sample of breztri for persistent shortness of breath.  Desaturated to 93% on walking  The patient, with a history of COPD and PE, presents with ongoing shortness of breath and wheezing. He reports that these symptoms are sometimes exacerbated by cold weather and physical exertion. Despite these symptoms, he is generally compliant with his prescribed apixaban and albuterol inhaler. He admits to occasional cigarette use when he sees others smoking, but otherwise reports being abstinent. He has previously tried a different inhaler, but does not recall its effects. He has also participated in an exercise program at his local hospital, but did not find it particularly beneficial.  The patient's PE was diagnosed after he experienced severe shortness of breath following minor physical activity. He was advised by a friend to seek medical attention due to his abnormal breathing. He has not traveled recently and there was no clear cause for the PE. He is aware that he may need to be on blood thinners indefinitely due to the risk of recurrence.  Significant tests/ events reviewed   PFTs 11/2022 moderate airway obstruction ratio 64, post BD FEV1 58%, no BD response, DLCO 13.4/54% normal TLC   CTA chest 06/2022 bilateral upper and lower lobe segmental and subsegmental pulmonary emboli, no RV strain Venous duplex left popliteal DVT   12/2 Echo 50-55%, no WMA, normal RVF  NPSG 01/2018 neg, desat to 89%    Review of Systems neg for any significant sore throat, dysphagia, itching, sneezing, nasal congestion or excess/ purulent secretions, fever, chills,  sweats, unintended wt loss, pleuritic or exertional cp, hempoptysis, orthopnea pnd or change in chronic leg swelling. Also denies presyncope, palpitations, heartburn, abdominal pain, nausea, vomiting, diarrhea or change in bowel or urinary habits, dysuria,hematuria, rash, arthralgias, visual complaints, headache, numbness weakness or ataxia.     Objective:   Physical Exam  Gen. Pleasant, well-nourished, in no distress ENT - no thrush, no pallor/icterus,no post nasal drip Neck: No JVD, no thyromegaly, no carotid bruits Lungs: no use of accessory muscles, no dullness to percussion, clear without rales or rhonchi  Cardiovascular: Rhythm regular, heart sounds  normal, no murmurs or gallops, no peripheral edema Musculoskeletal: No deformities, no cyanosis or clubbing        Assessment & Plan:   Chronic Obstructive Pulmonary Disease (COPD) Intermittent wheezing and dyspnea, exacerbated by cold weather. Using albuterol inhaler with partial relief. Occasionally smokes despite claiming to have quit. Prefers stronger inhaler for better symptom control. - Prescribe Trelegy inhaler - Provide sample of Trelegy inhaler - Educate on proper use of Trelegy inhaler - Advise to rinse mouth after using Trelegy inhaler  Pulmonary Embolism (PE) Unprovoked PE, currently on apixaban 5 mg BID. Occasional dyspnea, no recent travel or other risk factors. Long-term anticoagulation recommended due to unprovoked nature and tolerance of apixaban. Discussed risk of recurrent PE if anticoagulation is stopped. - Continue apixaban 5 mg BID - Emphasize importance of lifelong anticoagulation due to recurrent PE risk  General Health Maintenance Former smoker with COPD and PE, increased lung cancer risk. -  Enroll in annual lung cancer screening program - Encourage smoking cessation and provide support resources  Follow-up - Schedule follow-up in 3 months to assess COPD management and anticoagulation therapy.

## 2023-10-24 ENCOUNTER — Telehealth: Payer: Self-pay | Admitting: Acute Care

## 2023-10-24 ENCOUNTER — Other Ambulatory Visit: Payer: Self-pay

## 2023-10-24 DIAGNOSIS — F17210 Nicotine dependence, cigarettes, uncomplicated: Secondary | ICD-10-CM

## 2023-10-24 DIAGNOSIS — Z87891 Personal history of nicotine dependence: Secondary | ICD-10-CM

## 2023-10-24 DIAGNOSIS — Z122 Encounter for screening for malignant neoplasm of respiratory organs: Secondary | ICD-10-CM

## 2023-10-24 NOTE — Telephone Encounter (Signed)
 Lung Cancer Screening Narrative/Criteria Questionnaire (Cigarette Smokers Only- No Cigars/Pipes/vapes)   Ruben Young   SDMV:11/21/23 at 0930am/KATY                                           1951/07/27                         LDCT: 11/22/23 at 1030a/ [facility]    73 y.o.   Phone: [phone] /db  Lung Screening Narrative (confirm age 44-77 yrs Medicare / 50-80 yrs Private pay insurance)   Insurance information:[employer] medicare   Referring Provider:Alva   This screening involves an initial phone call with a team member from our program. It is called a shared decision making visit. The initial meeting is required by insurance and Medicare to make sure you understand the program. This appointment takes about 15-20 minutes to complete. The CT scan will completed at a separate date/time. This scan takes about 5-10 minutes to complete and you may eat and drink before and after the scan.  Criteria questions for Lung Cancer Screening:   Are you a current or former smoker? Current Age began smoking: 73 yo   If you are a former smoker, what year did you quit smoking? NA   To calculate your smoking history, I need an accurate estimate of how many packs of cigarettes you smoked per day and for how many years. (Not just the number of PPD you are now smoking)   Years smoking 58 x Packs per day 1/2 = Pack years 29   (at least 20 pack yrs)   (Make sure they understand that we need to know how much they have smoked in the past, not just the number of PPD they are smoking now)  Do you have a personal history of cancer?  No    Do you have a family history of cancer? No  Are you coughing up blood?  No  Have you had unexplained weight loss of 15 lbs or more in the last 6 months? No  It looks like you meet all criteria.     Additional information: N/A

## 2023-11-21 ENCOUNTER — Encounter: Payer: Self-pay | Admitting: Adult Health

## 2023-11-21 ENCOUNTER — Ambulatory Visit: Admitting: Adult Health

## 2023-11-21 DIAGNOSIS — F17210 Nicotine dependence, cigarettes, uncomplicated: Secondary | ICD-10-CM | POA: Diagnosis not present

## 2023-11-21 NOTE — Patient Instructions (Signed)

## 2023-11-21 NOTE — Progress Notes (Signed)
  Virtual Visit via Telephone Note  I connected with Ruben Young , 11/21/23 9:25 AM by a telemedicine application and verified that I am speaking with the correct person using two identifiers.  Location: Patient: home Provider: home   I discussed the limitations of evaluation and management by telemedicine and the availability of in person appointments. The patient expressed understanding and agreed to proceed.   Shared Decision Making Visit Lung Cancer Screening Program [phone])   Eligibility: 73 y.o. Pack Years Smoking History Calculation = 30 pack years  (# packs/per year x # years smoked) Recent History of coughing up blood  no Unexplained weight loss? no ( >Than 15 pounds within the last 6 months ) Prior History Lung / other cancer no (Diagnosis within the last 5 years already requiring surveillance chest CT Scans). Smoking Status Current Smoker  Visit Components: Discussion included one or more decision making aids. YES Discussion included risk/benefits of screening. YES Discussion included potential follow up diagnostic testing for abnormal scans. YES Discussion included meaning and risk of over diagnosis. YES Discussion included meaning and risk of False Positives. YES Discussion included meaning of total radiation exposure. YES  Counseling Included: Importance of adherence to annual lung cancer LDCT screening. YES Impact of comorbidities on ability to participate in the program. YES Ability and willingness to under diagnostic treatment. YES  Smoking Cessation Counseling: Current Smokers:  Discussed importance of smoking cessation. yes Information about tobacco cessation classes and interventions provided to patient. yes Patient provided with "ticket" for LDCT Scan. yes Symptomatic Patient. NO Diagnosis Code: Tobacco Use Z72.0 Asymptomatic Patient yes  Counseling (Intermediate counseling: > three minutes counseling) J8119 (CT Chest Lung Cancer Screening  Low Dose W/O CM) [id number]  Z12.2-Screening of respiratory organs Z87.891-Personal history of nicotine dependence   [name] 11/21/23

## 2023-11-22 ENCOUNTER — Ambulatory Visit (HOSPITAL_COMMUNITY)
Admission: RE | Admit: 2023-11-22 | Discharge: 2023-11-22 | Disposition: A | Discharge status: Home / Self Care | Source: Ambulatory Visit | Attending: Family Medicine | Admitting: Family Medicine

## 2023-11-22 DIAGNOSIS — Z122 Encounter for screening for malignant neoplasm of respiratory organs: Secondary | ICD-10-CM | POA: Diagnosis not present

## 2023-11-22 DIAGNOSIS — F17210 Nicotine dependence, cigarettes, uncomplicated: Secondary | ICD-10-CM | POA: Insufficient documentation

## 2023-11-22 DIAGNOSIS — Z87891 Personal history of nicotine dependence: Secondary | ICD-10-CM | POA: Diagnosis not present

## 2023-11-26 DIAGNOSIS — I509 Heart failure, unspecified: Secondary | ICD-10-CM | POA: Diagnosis not present

## 2023-11-26 DIAGNOSIS — I723 Aneurysm of iliac artery: Secondary | ICD-10-CM | POA: Diagnosis not present

## 2023-11-26 DIAGNOSIS — J439 Emphysema, unspecified: Secondary | ICD-10-CM | POA: Diagnosis not present

## 2023-11-26 DIAGNOSIS — R0600 Dyspnea, unspecified: Secondary | ICD-10-CM | POA: Diagnosis not present

## 2023-11-26 DIAGNOSIS — I5022 Chronic systolic (congestive) heart failure: Secondary | ICD-10-CM | POA: Diagnosis not present

## 2023-11-26 DIAGNOSIS — I2510 Atherosclerotic heart disease of native coronary artery without angina pectoris: Secondary | ICD-10-CM | POA: Diagnosis not present

## 2023-11-26 DIAGNOSIS — J432 Centrilobular emphysema: Secondary | ICD-10-CM | POA: Diagnosis not present

## 2023-11-26 DIAGNOSIS — I110 Hypertensive heart disease with heart failure: Secondary | ICD-10-CM | POA: Diagnosis not present

## 2023-11-26 DIAGNOSIS — Z720 Tobacco use: Secondary | ICD-10-CM | POA: Diagnosis not present

## 2023-11-26 DIAGNOSIS — I6990 Unspecified sequelae of unspecified cerebrovascular disease: Secondary | ICD-10-CM | POA: Diagnosis not present

## 2023-11-26 DIAGNOSIS — I2699 Other pulmonary embolism without acute cor pulmonale: Secondary | ICD-10-CM | POA: Diagnosis not present

## 2023-11-27 ENCOUNTER — Other Ambulatory Visit: Payer: Self-pay | Admitting: Cardiology

## 2023-11-27 DIAGNOSIS — R3912 Poor urinary stream: Secondary | ICD-10-CM | POA: Diagnosis not present

## 2023-12-17 ENCOUNTER — Other Ambulatory Visit: Payer: Self-pay | Admitting: Acute Care

## 2023-12-17 DIAGNOSIS — Z87891 Personal history of nicotine dependence: Secondary | ICD-10-CM

## 2023-12-17 DIAGNOSIS — F17210 Nicotine dependence, cigarettes, uncomplicated: Secondary | ICD-10-CM

## 2023-12-17 DIAGNOSIS — Z122 Encounter for screening for malignant neoplasm of respiratory organs: Secondary | ICD-10-CM

## 2024-01-29 ENCOUNTER — Other Ambulatory Visit: Payer: Self-pay | Admitting: Cardiology

## 2024-01-30 ENCOUNTER — Other Ambulatory Visit: Payer: Self-pay | Admitting: Cardiology

## 2024-02-17 IMAGING — CT CT MAXILLOFACIAL W/O CM
3 series · 14 of 47 positions shown, 16 images · non-contrast
Comparison: 11/08/2014

CLINICAL DATA: Neck trauma (Age >= 65y); Head trauma, minor (Age >=
65y); Facial trauma, blunt



[Series 2: max soft · axial · 0.38mm/px · z∈[-739,-597]mm · 8 of 83 slices shown, 10 images]
[im 6/83  brain]
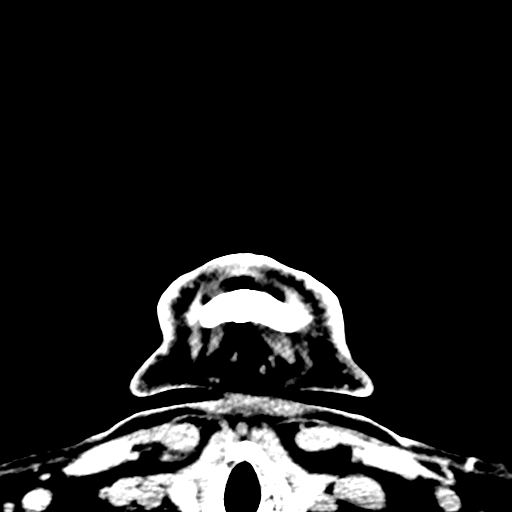
[im 6/83  bone]
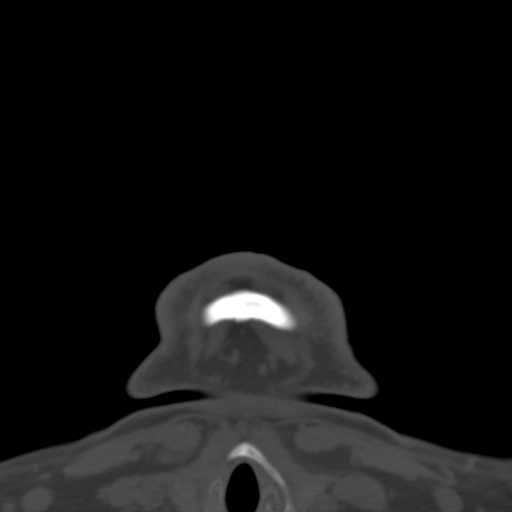
[im 17/83  bone]
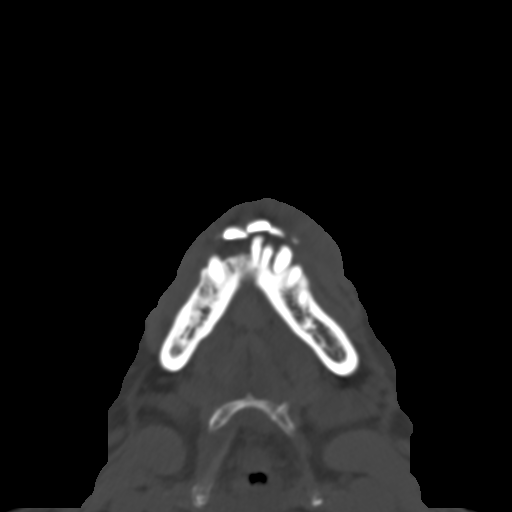
[im 26/83  bone]
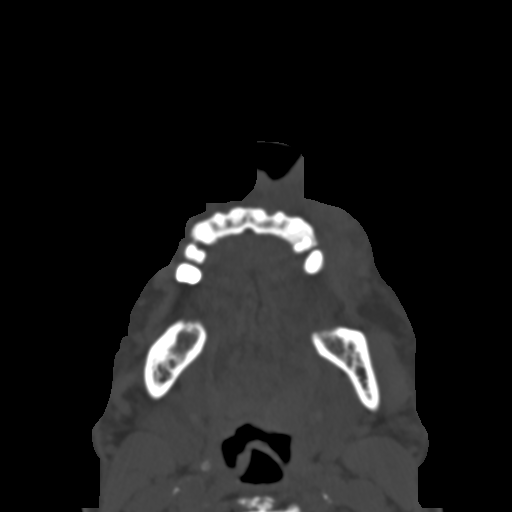
[im 37/83  bone]
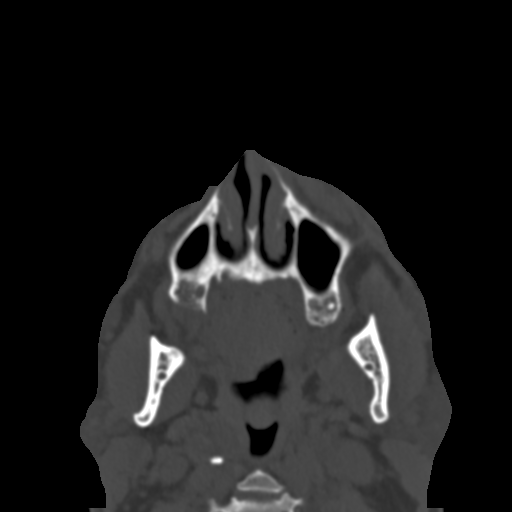
[im 46/83  brain]
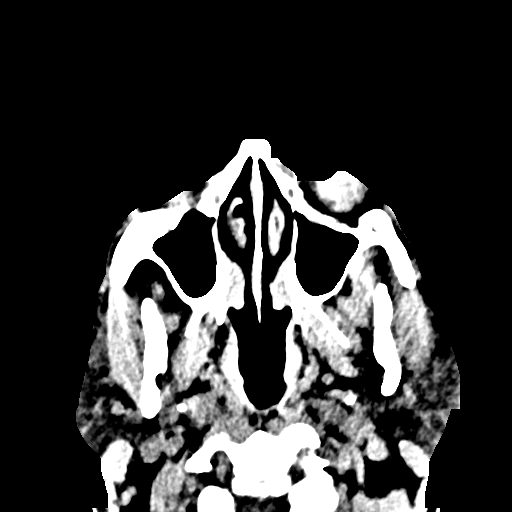
[im 46/83  bone]
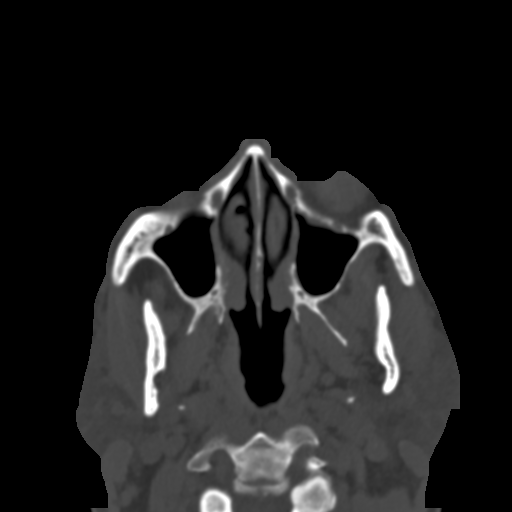
[im 57/83  bone]
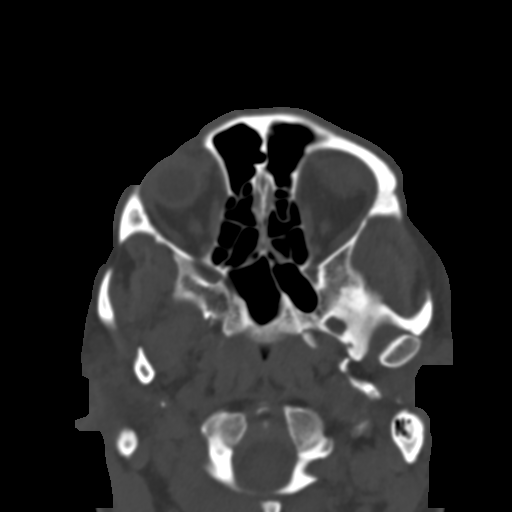
[im 66/83  bone]
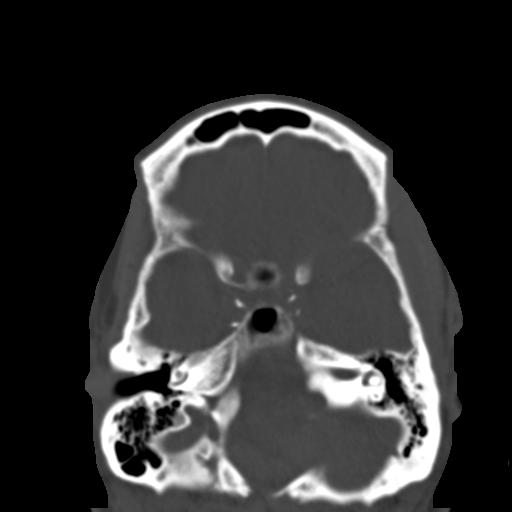
[im 77/83  bone]
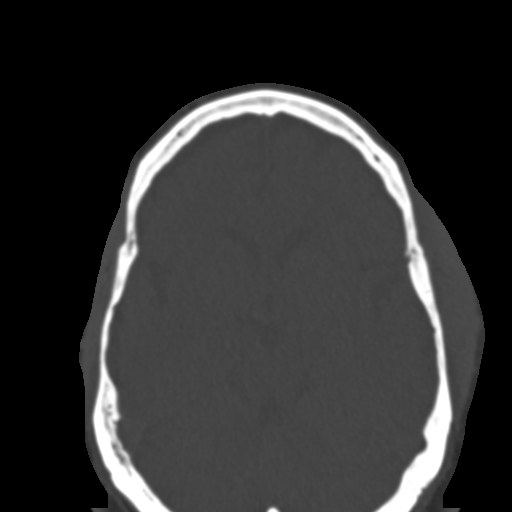

[Series 6: coronal soft · coronal · 0.43mm/px · 3 of 87 slices shown]
[im 29/87  bone]
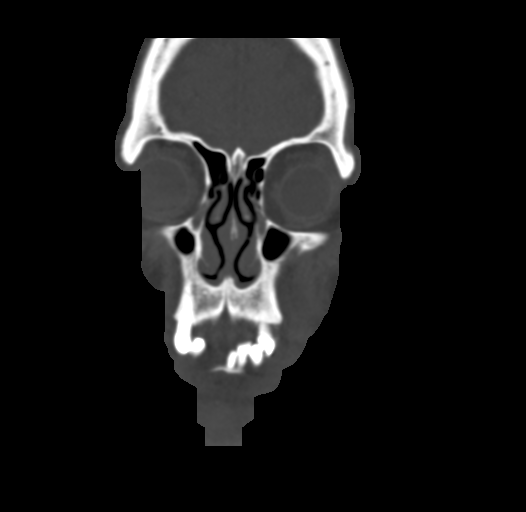
[im 39/87  bone]
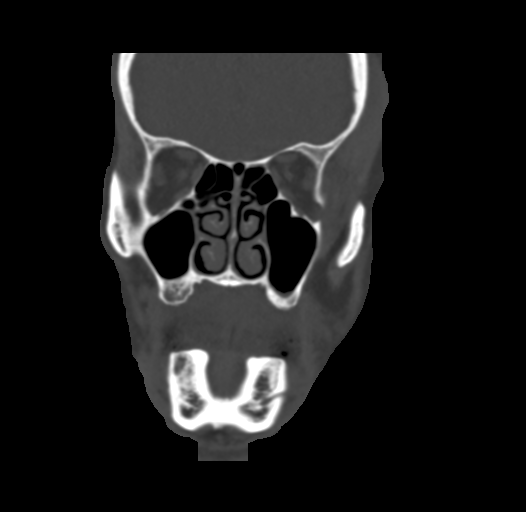
[im 48/87  bone]
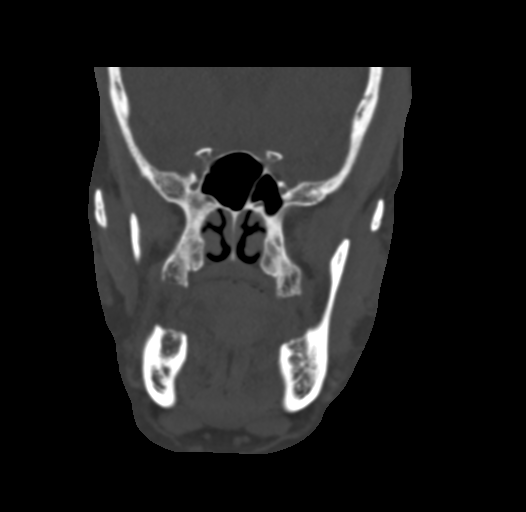

[Series 7: sagittal soft · sagittal · 0.43mm/px · 3 of 89 slices shown]
[im 30/89  bone]
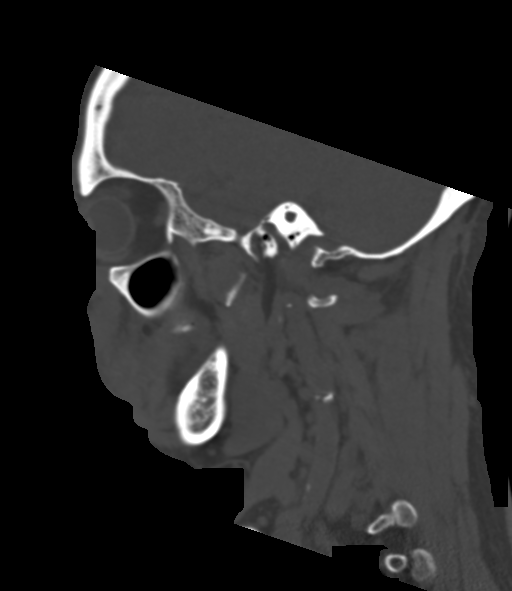
[im 45/89  bone]
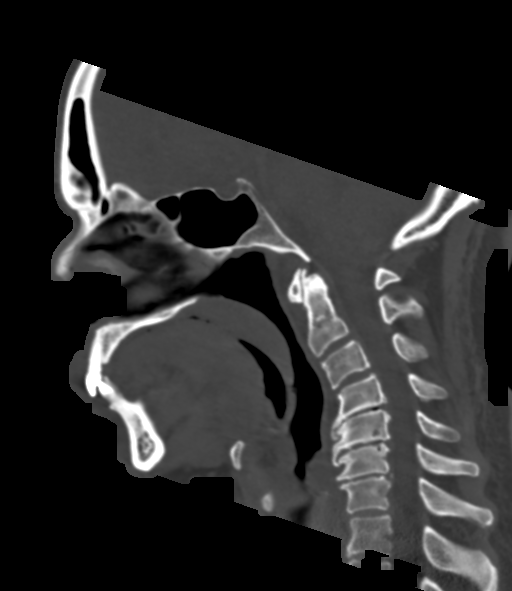
[im 59/89  bone]
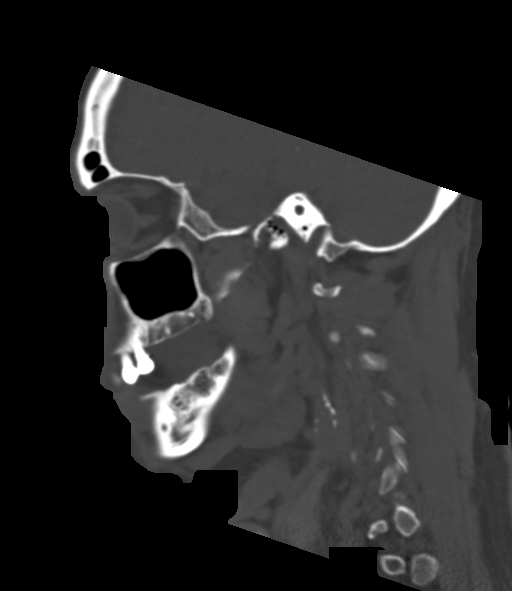

[14 of 47 positions shown; findings below may reference images not displayed]

FINDINGS: CT HEAD FINDINGS

Brain: No evidence of acute infarction, hemorrhage, hydrocephalus,
extra-axial collection or mass lesion/mass effect. Patchy
low-density changes within the periventricular and subcortical white
matter compatible with chronic microvascular ischemic change. Mild
diffuse cerebral volume loss.

Vascular: Atherosclerotic calcifications involving the large vessels
of the skull base. No unexpected hyperdense vessel.

Skull: Normal. Negative for fracture or focal lesion.

Other: Soft tissue swelling over the left temporoparietal scalp.

CT MAXILLOFACIAL FINDINGS

Osseous: Acute minimally depressed left nasal bone fracture. No
additional maxillofacial bone fracture. Bony orbital walls are
intact. Mandible intact. Temporomandibular joints are aligned
without dislocation.

Orbits: Negative. No traumatic or inflammatory finding.

Sinuses: Clear.

Soft tissues: Left facial and left periorbital soft tissue swelling.
Ill-defined soft tissue hematoma overlies the left maxillary region.

CT CERVICAL SPINE FINDINGS

Alignment: Facet joints are aligned without dislocation or traumatic
listhesis. Dens and lateral masses are aligned. Smooth reversal of
the cervical lordosis.

Skull base and vertebrae: No acute fracture. No primary bone lesion
or focal pathologic process.

Soft tissues and spinal canal: No prevertebral fluid or swelling. No
visible canal hematoma.

Disc levels: Degenerative disc disease most pronounced at C4-5,
C5-6, and C6-7. Multilevel facet arthropathy is more pronounced on
the right.

Upper chest: Mild emphysematous changes within the included lung
apices.

Other: Bilateral carotid atherosclerosis.
IMPRESSION: 1. No acute intracranial abnormality.
2. Soft tissue swelling over the left temporoparietal scalp. No
underlying calvarial fracture.
3. Acute minimally depressed left nasal bone fracture.
4. Left facial and left periorbital soft tissue swelling with
ill-defined soft tissue hematoma overlies the left maxillary region.
5. No acute cervical spine fracture or subluxation.

## 2024-02-17 IMAGING — CT CT HEAD W/O CM
3 of 4 series · 13 of 47 positions shown, 15 images · non-contrast
Comparison: 11/08/2014

CLINICAL DATA: Neck trauma (Age >= 65y); Head trauma, minor (Age >=
65y); Facial trauma, blunt



[Series 2: head w o · axial · 0.43mm/px · z∈[-629,-509]mm · 7 of 34 slices shown, 9 images]
[im 5/34  brain]
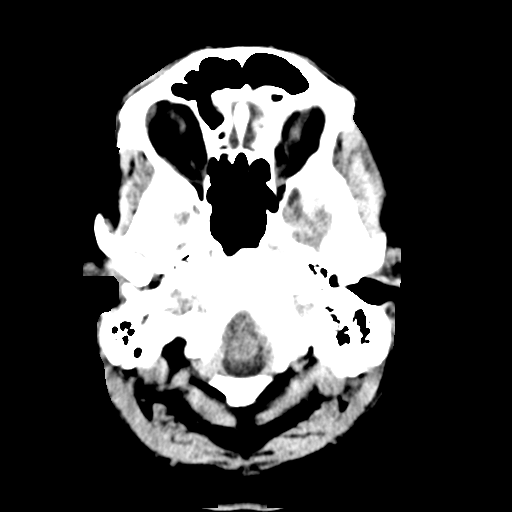
[im 5/34  bone]
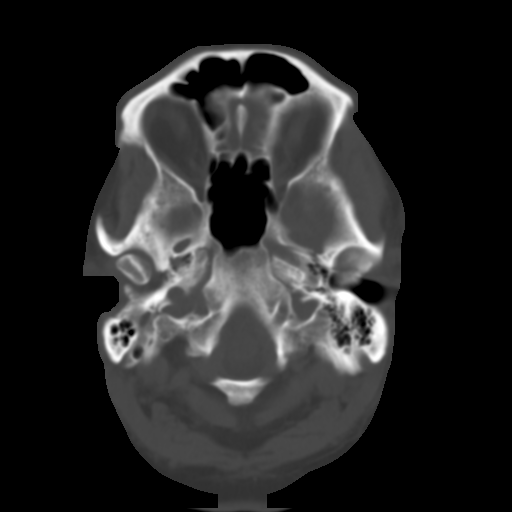
[im 9/34  brain]
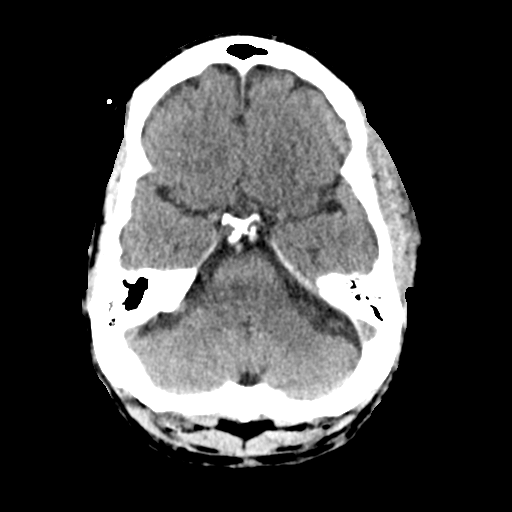
[im 13/34  brain]
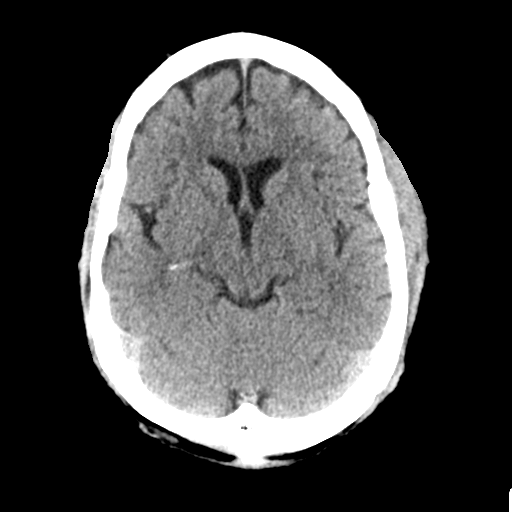
[im 17/34  brain]
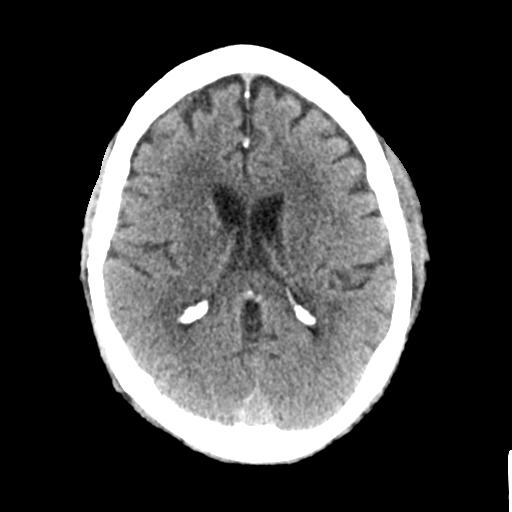
[im 21/34  brain]
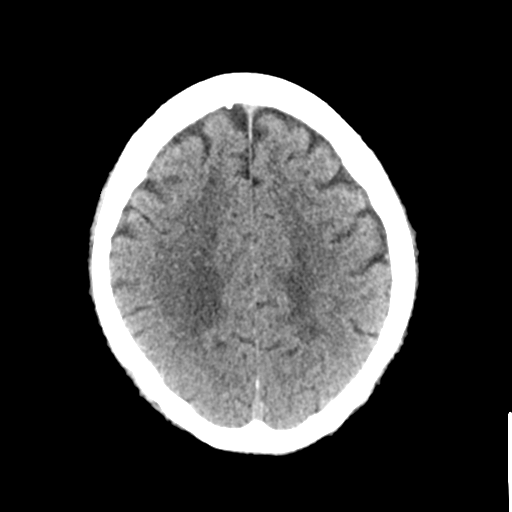
[im 21/34  bone]
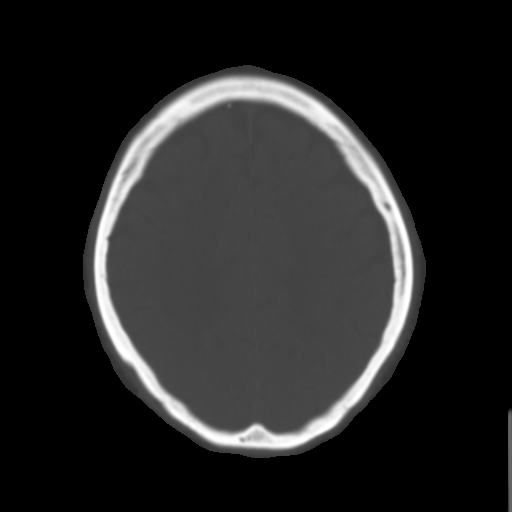
[im 25/34  brain]
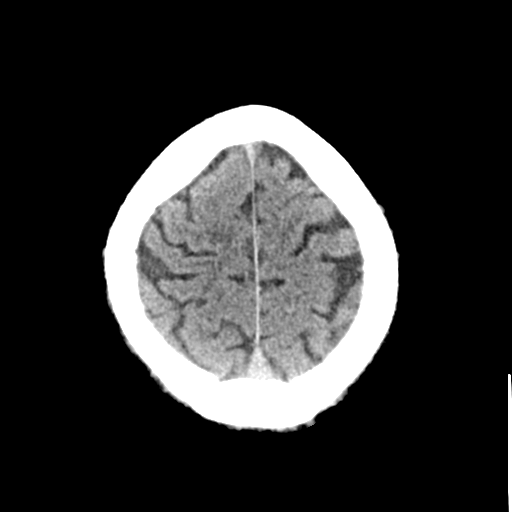
[im 29/34  brain]
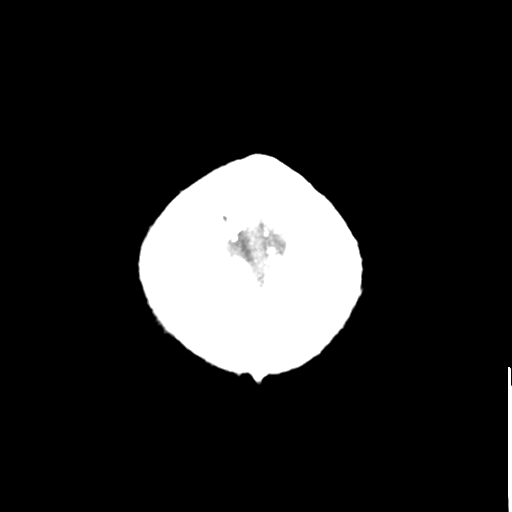

[Series 4: coronal soft · coronal · 0.34mm/px · 3 of 82 slices shown]
[im 28/82  brain]
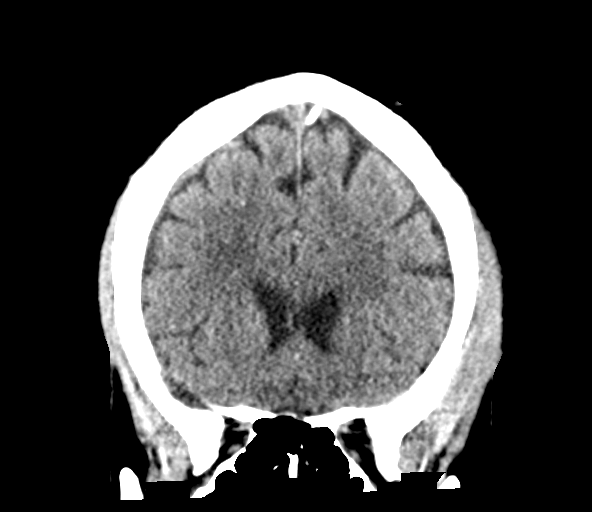
[im 37/82  brain]
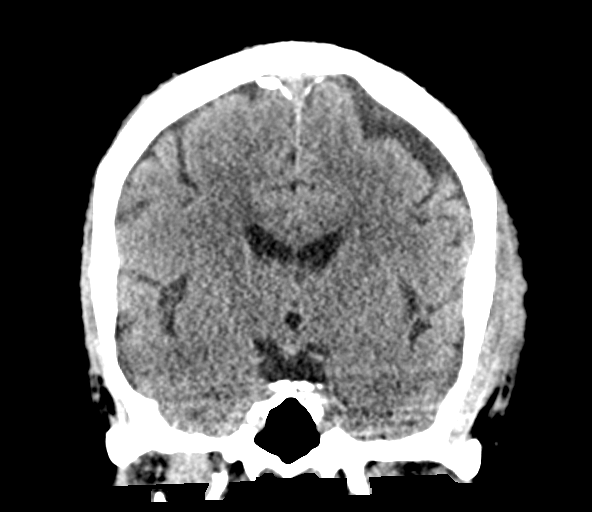
[im 46/82  brain]
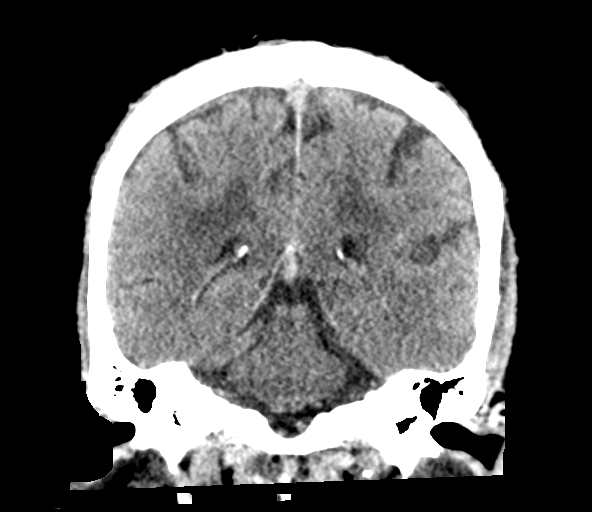

[Series 5: sagittal soft · sagittal · 0.36mm/px · 3 of 59 slices shown]
[im 20/59  brain]
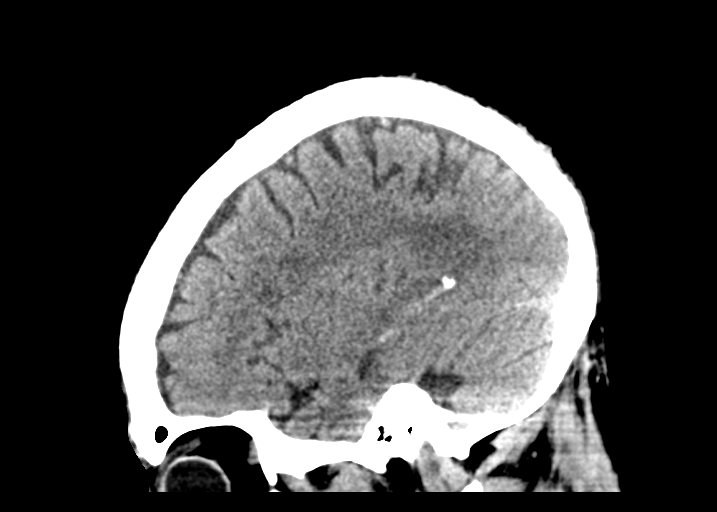
[im 30/59  brain]
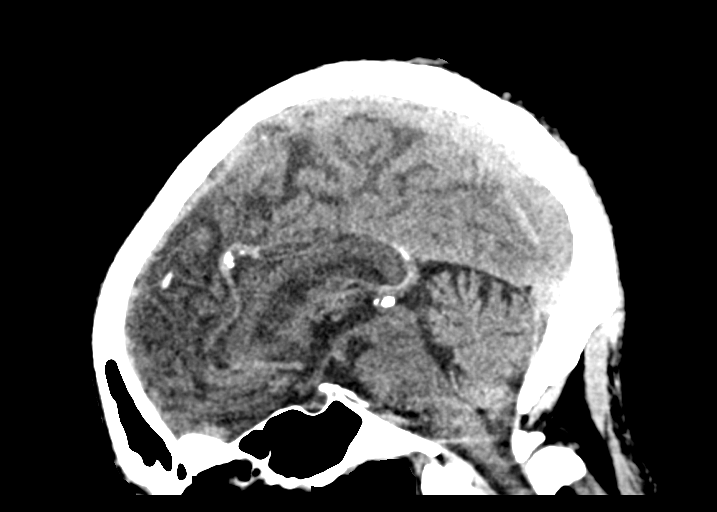
[im 39/59  brain]
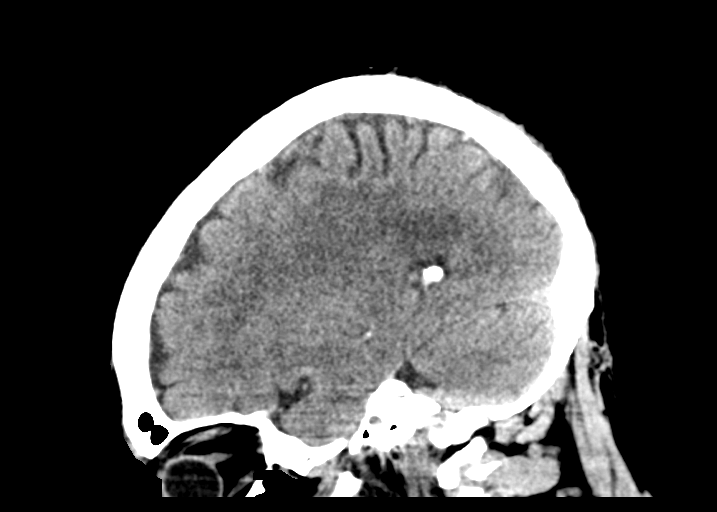

[13 of 47 positions shown; findings below may reference images not displayed]

FINDINGS: CT HEAD FINDINGS

Brain: No evidence of acute infarction, hemorrhage, hydrocephalus,
extra-axial collection or mass lesion/mass effect. Patchy
low-density changes within the periventricular and subcortical white
matter compatible with chronic microvascular ischemic change. Mild
diffuse cerebral volume loss.

Vascular: Atherosclerotic calcifications involving the large vessels
of the skull base. No unexpected hyperdense vessel.

Skull: Normal. Negative for fracture or focal lesion.

Other: Soft tissue swelling over the left temporoparietal scalp.

CT MAXILLOFACIAL FINDINGS

Osseous: Acute minimally depressed left nasal bone fracture. No
additional maxillofacial bone fracture. Bony orbital walls are
intact. Mandible intact. Temporomandibular joints are aligned
without dislocation.

Orbits: Negative. No traumatic or inflammatory finding.

Sinuses: Clear.

Soft tissues: Left facial and left periorbital soft tissue swelling.
Ill-defined soft tissue hematoma overlies the left maxillary region.

CT CERVICAL SPINE FINDINGS

Alignment: Facet joints are aligned without dislocation or traumatic
listhesis. Dens and lateral masses are aligned. Smooth reversal of
the cervical lordosis.

Skull base and vertebrae: No acute fracture. No primary bone lesion
or focal pathologic process.

Soft tissues and spinal canal: No prevertebral fluid or swelling. No
visible canal hematoma.

Disc levels: Degenerative disc disease most pronounced at C4-5,
C5-6, and C6-7. Multilevel facet arthropathy is more pronounced on
the right.

Upper chest: Mild emphysematous changes within the included lung
apices.

Other: Bilateral carotid atherosclerosis.
IMPRESSION: 1. No acute intracranial abnormality.
2. Soft tissue swelling over the left temporoparietal scalp. No
underlying calvarial fracture.
3. Acute minimally depressed left nasal bone fracture.
4. Left facial and left periorbital soft tissue swelling with
ill-defined soft tissue hematoma overlies the left maxillary region.
5. No acute cervical spine fracture or subluxation.

## 2024-03-07 ENCOUNTER — Other Ambulatory Visit (HOSPITAL_BASED_OUTPATIENT_CLINIC_OR_DEPARTMENT_OTHER): Payer: Self-pay

## 2024-03-07 MED ORDER — APIXABAN 5 MG PO TABS: 5.0000 mg | ORAL_TABLET | Freq: Two times a day (BID) | ORAL | 2 refills | Status: DC

## 2024-04-15 DIAGNOSIS — I5022 Chronic systolic (congestive) heart failure: Secondary | ICD-10-CM | POA: Diagnosis not present

## 2024-04-15 DIAGNOSIS — I10 Essential (primary) hypertension: Secondary | ICD-10-CM | POA: Diagnosis not present

## 2024-04-15 DIAGNOSIS — Z720 Tobacco use: Secondary | ICD-10-CM | POA: Diagnosis not present

## 2024-04-15 DIAGNOSIS — R0602 Shortness of breath: Secondary | ICD-10-CM | POA: Diagnosis not present

## 2024-04-15 DIAGNOSIS — J432 Centrilobular emphysema: Secondary | ICD-10-CM | POA: Diagnosis not present

## 2024-04-15 DIAGNOSIS — I110 Hypertensive heart disease with heart failure: Secondary | ICD-10-CM | POA: Diagnosis not present

## 2024-04-24 ENCOUNTER — Encounter (HOSPITAL_COMMUNITY): Payer: Self-pay

## 2024-04-24 ENCOUNTER — Other Ambulatory Visit: Payer: Self-pay

## 2024-04-24 ENCOUNTER — Emergency Department (HOSPITAL_COMMUNITY)
Admission: EM | Admit: 2024-04-24 | Discharge: 2024-04-24 | Disposition: A | Discharge status: Home / Self Care | Attending: Emergency Medicine | Admitting: Emergency Medicine

## 2024-04-24 DIAGNOSIS — Z7982 Long term (current) use of aspirin: Secondary | ICD-10-CM | POA: Diagnosis not present

## 2024-04-24 DIAGNOSIS — K0889 Other specified disorders of teeth and supporting structures: Secondary | ICD-10-CM | POA: Diagnosis present

## 2024-04-24 DIAGNOSIS — Z7901 Long term (current) use of anticoagulants: Secondary | ICD-10-CM | POA: Insufficient documentation

## 2024-04-24 DIAGNOSIS — K047 Periapical abscess without sinus: Secondary | ICD-10-CM | POA: Insufficient documentation

## 2024-04-24 MED ORDER — HYDROCODONE-ACETAMINOPHEN 5-325 MG PO TABS: 1.0000 | ORAL_TABLET | Freq: Four times a day (QID) | ORAL | 0 refills | Status: DC | PRN

## 2024-04-24 MED ORDER — HYDROCODONE-ACETAMINOPHEN 5-325 MG PO TABS
1.0000 | ORAL_TABLET | Freq: Once | ORAL | Status: AC
  Administered 2024-04-24: 1 via ORAL
  Filled 2024-04-24: qty 1

## 2024-04-24 MED ORDER — AMOXICILLIN 250 MG PO CAPS
500.0000 mg | ORAL_CAPSULE | Freq: Once | ORAL | Status: AC
  Administered 2024-04-24: 500 mg via ORAL
  Filled 2024-04-24: qty 2

## 2024-04-24 MED ORDER — AMOXICILLIN 500 MG PO CAPS: 500.0000 mg | ORAL_CAPSULE | Freq: Three times a day (TID) | ORAL | 0 refills | Status: DC

## 2024-04-24 NOTE — Discharge Instructions (Signed)
 Keep your appointment with your dentist next week as discussed.  In the interim continue taking the antibiotics prescribed until gone.  I prescribed you some pain medication for the next several days, once the infection is improved you should get adequate pain relief using Tylenol  or Motrin .  Get rechecked sooner for any worsening pain, swelling, fever or new complaints.

## 2024-04-24 NOTE — ED Triage Notes (Signed)
 Pt arrived via POV from home c/o dental pain and not being able to see his dentist until the end of next month. Pt reports multiple loose teeth and difficulty eating food.

## 2024-04-26 NOTE — ED Provider Notes (Signed)
 [place] EMERGENCY DEPARTMENT AT [facility] Provider Note   CSN: [id number] Arrival date & time: 04/24/24  9093     Patient presents with: Dental Pain   Ruben Young is a 73 y.o. male.   The history is provided by the patient.  Dental Pain Location:  Upper Upper teeth location:  8/RU central incisor and 7/RU lateral incisor Quality:  Aching and throbbing Severity:  Moderate Onset quality:  Gradual Duration:  1 week Timing:  Constant Progression:  Worsening Chronicity:  New Context: dental caries and poor dentition   Relieved by:  Nothing Worsened by:  Touching Ineffective treatments:  Acetaminophen  Associated symptoms: gum swelling   Associated symptoms: no drooling, no facial pain, no facial swelling, no fever, no neck pain and no trismus        Prior to Admission medications   Medication Sig Start Date End Date Taking? Authorizing Provider  amoxicillin  (AMOXIL ) 500 MG capsule Take 1 capsule (500 mg total) by mouth 3 (three) times daily. 04/24/24  Yes Giara Mcgaughey, PA-C  HYDROcodone -acetaminophen  (NORCO/VICODIN) 5-325 MG tablet Take 1 tablet by mouth every 6 (six) hours as needed for severe pain (pain score 7-10). 04/24/24  Yes Herberto Ledwell, Mliss, PA-C  albuterol  (VENTOLIN  HFA) 108 (90 Base) MCG/ACT inhaler Inhale 2 puffs into the lungs every 4 (four) hours as needed for wheezing or shortness of breath. 12/05/22   Jude Harden GAILS, MD  amLODipine  (NORVASC ) 10 MG tablet TAKE 1 TABLET(10 MG) BY MOUTH DAILY 05/23/23   Jeffrie Oneil BROCKS, MD  apixaban  (ELIQUIS ) 5 MG TABS tablet Take 1 tablet (5 mg total) by mouth 2 (two) times daily. 03/07/24   Jude Harden GAILS, MD  Ascorbic Acid (VITAMIN C PO) Take 1,000 mg by mouth every other day. Patient not taking: Reported on 09/12/2023    [provider]  aspirin  EC 81 MG tablet Take 81 mg by mouth daily.    [provider]  atorvastatin  (LIPITOR) 40 MG tablet TAKE 1 TABLET BY MOUTH DAILY AT 6 PM 11/27/23   Jeffrie Oneil BROCKS,  MD  B Complex Vitamins (B COMPLEX PO) Take 1 capsule by mouth every other day.    [provider]  CALCIUM  PO Take 1 tablet by mouth every other day.    [provider]  carvedilol  (COREG ) 6.25 MG tablet Take 1 tablet (6.25 mg total) by mouth 2 (two) times daily with a meal. Please call [phone] to schedule an appointment for future refills. Thank you. 01/30/24   Jeffrie Oneil BROCKS, MD  Cholecalciferol (VITAMIN D3) 25 MCG (1000 UT) CAPS Take 1,000 Units by mouth daily.     [provider]  ferrous sulfate 325 (65 FE) MG EC tablet Take 325 mg by mouth every other day.    [provider]  Fluticasone-Umeclidin-Vilant (TRELEGY ELLIPTA ) 100-62.5-25 MCG/ACT AEPB Inhale 1 puff into the lungs daily. 09/12/23   Alva, Rakesh V, MD  Fluticasone-Umeclidin-Vilant (TRELEGY ELLIPTA ) 100-62.5-25 MCG/ACT AEPB Inhale 100 mcg into the lungs daily. 09/12/23   Jude Harden GAILS, MD  GINSENG PO Take 1 capsule by mouth every other day. Patient not taking: Reported on 09/12/2023    [provider]  hydrochlorothiazide (HYDRODIURIL) 25 MG tablet Take 25 mg by mouth daily. Patient not taking: Reported on 09/12/2023 07/18/22   [provider]  hydrocortisone cream 1 % Apply 1 Application topically daily as needed for itching.    [provider]  Multiple Vitamin (MULTIVITAMIN WITH MINERALS) TABS tablet Take 1 tablet  by mouth daily.    [provider]  nitroGLYCERIN  (NITROSTAT ) 0.4 MG SL tablet Place 1 tablet (0.4 mg total) under the tongue every 5 (five) minutes x 3 doses as needed for chest pain. For chest pains 05/05/21   Jeffrie Oneil BROCKS, MD  Omega-3 Fatty Acids (FISH OIL) 1000 MG CAPS Take 1,000 mg by mouth every other day.    [provider]  POTASSIUM PO Take 1 tablet by mouth every other day.    [provider]  sulfamethoxazole-trimethoprim (BACTRIM DS) 800-160 MG tablet Take 1 tablet by mouth 2 (two) times daily. 03/29/23   [provider]  vitamin E 400 UNIT capsule Take 400 Units by mouth every other day.    [provider]    Allergies: Patient has no known allergies.    Review of Systems  Constitutional:  Negative for fever.  HENT:  Positive for dental problem. Negative for drooling and facial swelling.   Musculoskeletal:  Negative for neck pain.  All other systems reviewed and are negative.   Updated Vital Signs BP (!) 147/81   Pulse 68   Temp 97.6 F (36.4 C) (Temporal)   Resp 18   Ht 5' 8 (1.727 m)   Wt 79.8 kg   SpO2 95%   BMI 26.76 kg/m   Physical Exam Constitutional:      General: He is not in acute distress.    Appearance: He is well-developed.  HENT:     Head: Normocephalic and atraumatic.     Jaw: No trismus.     Right Ear: Tympanic membrane and external ear normal.     Left Ear: Tympanic membrane and external ear normal.     Mouth/Throat:     Mouth: Mucous membranes are moist. No oral lesions.     Dentition: Gingival swelling present. No dental abscesses.     Pharynx: Oropharynx is clear. No posterior oropharyngeal erythema.     Comments: Multiple extracted teeth, remaining teeth have significant gingival regression.  His right central and lateral incisors have significant gumline regression, his gingiva at this location is swollen and erythematous without fluctuance or drainage.  Sublingual space is soft, no trismus. Eyes:     Conjunctiva/sclera: Conjunctivae normal.  Cardiovascular:     Rate and Rhythm: Normal rate.     Heart sounds: Normal heart sounds.  Pulmonary:     Effort: Pulmonary effort is normal.  Abdominal:     General: There is no distension.  Musculoskeletal:        General: Normal range of motion.     Cervical back: Normal range of motion and neck supple.  Lymphadenopathy:     Cervical: No cervical adenopathy.  Skin:    General: Skin is warm and dry.     Findings: No erythema.  Neurological:     Mental Status: He is alert and oriented to  person, place, and time.     (all labs ordered are listed, but only abnormal results are displayed) Labs Reviewed - No data to display  EKG: None  Radiology: No results found.   Procedures   Medications Ordered in the ED  amoxicillin  (AMOXIL ) capsule 500 mg (500 mg Oral Given 04/24/24 1020)  HYDROcodone -acetaminophen  (NORCO/VICODIN) 5-325 MG per tablet 1 tablet (1 tablet Oral Given 04/24/24 1020)  Medical Decision Making Patient with dental infection without drainable dental abscess.  He is scheduled to see his dentist next week and he was encouraged to keep this appointment.  In the interim he was placed on antibiotics and also prescribed pain medicine for several days after which he can switch to Tylenol  or Motrin .  No Ludwig's angina on today's exam.  Encouraged soft foods until symptoms improve.  Risk Prescription drug management.        Final diagnoses:  Dental infection    ED Discharge Orders          Ordered    amoxicillin  (AMOXIL ) 500 MG capsule  3 times daily        04/24/24 1018    HYDROcodone -acetaminophen  (NORCO/VICODIN) 5-325 MG tablet  Every 6 hours PRN        04/24/24 1018               Shoshanah Dapper, PA-C 04/26/24 1223    Patsey Lot, MD 05/03/24 1434

## 2024-05-17 ENCOUNTER — Other Ambulatory Visit (HOSPITAL_BASED_OUTPATIENT_CLINIC_OR_DEPARTMENT_OTHER): Payer: Self-pay | Admitting: Pulmonary Disease

## 2024-05-20 ENCOUNTER — Other Ambulatory Visit: Payer: Self-pay

## 2024-05-20 ENCOUNTER — Encounter (HOSPITAL_COMMUNITY): Payer: Self-pay

## 2024-05-20 ENCOUNTER — Emergency Department (HOSPITAL_COMMUNITY)
Admission: EM | Admit: 2024-05-20 | Discharge: 2024-05-20 | Disposition: A | Discharge status: Home / Self Care | Attending: Emergency Medicine | Admitting: Emergency Medicine

## 2024-05-20 DIAGNOSIS — K047 Periapical abscess without sinus: Secondary | ICD-10-CM | POA: Insufficient documentation

## 2024-05-20 DIAGNOSIS — Z7901 Long term (current) use of anticoagulants: Secondary | ICD-10-CM | POA: Insufficient documentation

## 2024-05-20 DIAGNOSIS — R220 Localized swelling, mass and lump, head: Secondary | ICD-10-CM | POA: Diagnosis present

## 2024-05-20 DIAGNOSIS — Z7982 Long term (current) use of aspirin: Secondary | ICD-10-CM | POA: Insufficient documentation

## 2024-05-20 MED ORDER — HYDROCODONE-ACETAMINOPHEN 5-325 MG PO TABS
1.0000 | ORAL_TABLET | Freq: Once | ORAL | Status: AC
  Administered 2024-05-20: 1 via ORAL
  Filled 2024-05-20: qty 1

## 2024-05-20 MED ORDER — AMOXICILLIN-POT CLAVULANATE 875-125 MG PO TABS
1.0000 | ORAL_TABLET | Freq: Once | ORAL | Status: AC
  Administered 2024-05-20: 1 via ORAL
  Filled 2024-05-20: qty 1

## 2024-05-20 MED ORDER — BENZOCAINE 20 % MT AERO
INHALATION_SPRAY | Freq: Once | OROMUCOSAL | Status: AC
  Filled 2024-05-20: qty 57

## 2024-05-20 MED ORDER — AMOXICILLIN-POT CLAVULANATE 875-125 MG PO TABS: 1.0000 | ORAL_TABLET | Freq: Two times a day (BID) | ORAL | 0 refills | Status: DC

## 2024-05-20 MED ORDER — HYDROCODONE-ACETAMINOPHEN 5-325 MG PO TABS: 1.0000 | ORAL_TABLET | Freq: Four times a day (QID) | ORAL | 0 refills | Status: AC | PRN

## 2024-05-20 NOTE — ED Triage Notes (Signed)
 Pt arrived via POV c/o recurrent oral/gum swelling and possible infection. Pt reports he made a dental appt but couldn't go due to his infection. Pt reports taking prescribed antibiotics but the infection returned. Pt reports he needs teeth pulled.

## 2024-05-20 NOTE — ED Notes (Signed)
 See triage notes. No obvious swelling noted. Nad. A/o.

## 2024-05-20 NOTE — Discharge Instructions (Signed)
 Take the entire course of the antibiotics prescribed, this is stronger than the last antibiotic you are taking.

## 2024-05-22 NOTE — ED Provider Notes (Signed)
 [place] EMERGENCY DEPARTMENT AT [facility] Provider Note   CSN: [id number] Arrival date & time: 05/20/24  1436     Patient presents with: Oral Swelling   Ruben Young is a 73 y.o. male presenting for reevaluation of oral swelling which flared up again over the past several days.  He was seen by me for the same complaint 1 month ago at which time he was placed on antibiotics but before he was able to see his dentist his infection has returned therefore he has rescheduled his appointment.  He endorses pain and swelling of his gumline at the same location.  He has had no fevers or chills, denies any other complaints other than difficulty eating secondary to pain.  He endorses completing the entire course of his antibiotics from his last visit.   The history is provided by the patient.       Prior to Admission medications   Medication Sig Start Date End Date Taking? Authorizing Provider  amoxicillin -clavulanate (AUGMENTIN ) 875-125 MG tablet Take 1 tablet by mouth every 12 (twelve) hours. 05/20/24  Yes Solei Wubben, PA-C  HYDROcodone -acetaminophen  (NORCO/VICODIN) 5-325 MG tablet Take 1 tablet by mouth every 6 (six) hours as needed for severe pain (pain score 7-10). 05/20/24  Yes Tyrees Chopin, Mliss, PA-C  albuterol  (VENTOLIN  HFA) 108 (90 Base) MCG/ACT inhaler Inhale 2 puffs into the lungs every 4 (four) hours as needed for wheezing or shortness of breath. 12/05/22   Jude Harden GAILS, MD  amLODipine  (NORVASC ) 10 MG tablet TAKE 1 TABLET(10 MG) BY MOUTH DAILY 05/23/23   Jeffrie Oneil BROCKS, MD  apixaban  (ELIQUIS ) 5 MG TABS tablet Take 1 tablet (5 mg total) by mouth 2 (two) times daily. 03/07/24   Jude Harden GAILS, MD  Ascorbic Acid (VITAMIN C PO) Take 1,000 mg by mouth every other day. Patient not taking: Reported on 09/12/2023    [provider]  aspirin  EC 81 MG tablet Take 81 mg by mouth daily.    [provider]  atorvastatin  (LIPITOR) 40 MG tablet TAKE 1 TABLET BY MOUTH DAILY  AT 6 PM 11/27/23   Jeffrie Oneil BROCKS, MD  B Complex Vitamins (B COMPLEX PO) Take 1 capsule by mouth every other day.    [provider]  CALCIUM  PO Take 1 tablet by mouth every other day.    [provider]  carvedilol  (COREG ) 6.25 MG tablet Take 1 tablet (6.25 mg total) by mouth 2 (two) times daily with a meal. Please call [phone] to schedule an appointment for future refills. Thank you. 01/30/24   Jeffrie Oneil BROCKS, MD  Cholecalciferol (VITAMIN D3) 25 MCG (1000 UT) CAPS Take 1,000 Units by mouth daily.     [provider]  ferrous sulfate 325 (65 FE) MG EC tablet Take 325 mg by mouth every other day.    [provider]  Fluticasone-Umeclidin-Vilant (TRELEGY ELLIPTA ) 100-62.5-25 MCG/ACT AEPB Inhale 100 mcg into the lungs daily. 09/12/23   Jude Harden GAILS, MD  Fluticasone-Umeclidin-Vilant (TRELEGY ELLIPTA ) 100-62.5-25 MCG/ACT AEPB INHALE 1 PUFF INTO THE LUNGS DAILY 05/19/24   Alva, Rakesh V, MD  GINSENG PO Take 1 capsule by mouth every other day. Patient not taking: Reported on 09/12/2023    [provider]  hydrochlorothiazide (HYDRODIURIL) 25 MG tablet Take 25 mg by mouth daily. Patient not taking: Reported on 09/12/2023 07/18/22   [provider]  hydrocortisone cream 1 % Apply 1 Application topically daily as needed for itching.    [provider]  Multiple Vitamin (MULTIVITAMIN WITH MINERALS) TABS tablet Take 1 tablet by mouth daily.    [provider]  nitroGLYCERIN  (NITROSTAT ) 0.4 MG SL tablet Place 1 tablet (0.4 mg total) under the tongue every 5 (five) minutes x 3 doses as needed for chest pain. For chest pains 05/05/21   Jeffrie Oneil BROCKS, MD  Omega-3 Fatty Acids (FISH OIL) 1000 MG CAPS Take 1,000 mg by mouth every other day.    [provider]  POTASSIUM PO Take 1 tablet by mouth every other day.    [provider]  sulfamethoxazole-trimethoprim (BACTRIM DS) 800-160 MG tablet Take 1 tablet by mouth 2 (two) times  daily. 03/29/23   [provider]  vitamin E 400 UNIT capsule Take 400 Units by mouth every other day.    [provider]    Allergies: Patient has no known allergies.    Review of Systems  Constitutional:  Negative for fever.  HENT:  Positive for dental problem. Negative for facial swelling, sore throat and trouble swallowing.   Respiratory:  Negative for shortness of breath.   Musculoskeletal:  Negative for neck pain and neck stiffness.    Updated Vital Signs BP (!) 160/87   Pulse 66   Temp 98.1 F (36.7 C) (Oral)   Resp 16   Ht 5' 8 (1.727 m)   Wt 79.8 kg   SpO2 94%   BMI 26.76 kg/m   Physical Exam Constitutional:      General: He is not in acute distress.    Appearance: He is well-developed.  HENT:     Head: Normocephalic and atraumatic.     Jaw: No trismus.     Right Ear: Tympanic membrane and external ear normal.     Left Ear: Tympanic membrane and external ear normal.     Mouth/Throat:     Mouth: Mucous membranes are moist. No oral lesions.     Dentition: Gingival swelling present. No dental abscesses.     Pharynx: Oropharynx is clear. No posterior oropharyngeal erythema.     Comments: Multiple extracted teeth, remaining teeth have significant gingival regression.  His right central and lateral incisors have significant gumline regression, his gingiva at this location is swollen and erythematous with fluctuance but no spontaneous drainage.  Sublingual space is soft, no trismus. Eyes:     Conjunctiva/sclera: Conjunctivae normal.  Cardiovascular:     Rate and Rhythm: Normal rate.     Heart sounds: Normal heart sounds.  Pulmonary:     Effort: Pulmonary effort is normal.  Abdominal:     General: There is no distension.  Musculoskeletal:        General: Normal range of motion.     Cervical back: Normal range of motion and neck supple.  Lymphadenopathy:     Cervical: No cervical adenopathy.  Skin:    General: Skin is warm and dry.      Findings: No erythema.  Neurological:     Mental Status: He is alert and oriented to person, place, and time.     (all labs ordered are listed, but only abnormal results are displayed) Labs Reviewed - No data to display  EKG: None  Radiology: No results found.   Procedures    INCISION AND DRAINAGE Performed by: Mliss Narrow Consent: Verbal consent obtained. Risks and benefits: risks, benefits and alternatives were discussed Type: abscess  Body area: right upper gingiva at central and lateral incisor.  Anesthesia: hurricaine topical spray  Stab Incision was made with a  scalpel.  Local anesthetic: n/a Anesthetic total: n/a ml  Complexity: simple No dissection, gentle pressure applied Drainage: purulent  Drainage amount: small  Packing material: no packing  Patient tolerance: Patient tolerated the procedure well with no immediate complications.   Medications Ordered in the ED  Benzocaine (HURRCAINE) 20 % mouth spray ( Mouth/Throat Given by Other 05/20/24 1712)  amoxicillin -clavulanate (AUGMENTIN ) 875-125 MG per tablet 1 tablet (1 tablet Oral Given 05/20/24 1712)  HYDROcodone -acetaminophen  (NORCO/VICODIN) 5-325 MG per tablet 1 tablet (1 tablet Oral Given 05/20/24 1712)                                    Medical Decision Making Risk OTC drugs. Prescription drug management.        Final diagnoses:  Dental abscess    ED Discharge Orders          Ordered    amoxicillin -clavulanate (AUGMENTIN ) 875-125 MG tablet  Every 12 hours        05/20/24 1705    HYDROcodone -acetaminophen  (NORCO/VICODIN) 5-325 MG tablet  Every 6 hours PRN        05/20/24 1705               Mionna Advincula, PA-C 05/22/24 1244    Butler, Michael C, MD 05/23/24 [phone]

## 2024-06-23 ENCOUNTER — Encounter (HOSPITAL_COMMUNITY): Payer: Self-pay

## 2024-06-23 ENCOUNTER — Ambulatory Visit: Payer: Self-pay | Admitting: Pulmonary Disease

## 2024-06-23 ENCOUNTER — Telehealth (HOSPITAL_BASED_OUTPATIENT_CLINIC_OR_DEPARTMENT_OTHER): Payer: Self-pay

## 2024-06-23 ENCOUNTER — Emergency Department (HOSPITAL_COMMUNITY)

## 2024-06-23 ENCOUNTER — Other Ambulatory Visit: Payer: Self-pay

## 2024-06-23 ENCOUNTER — Emergency Department (HOSPITAL_COMMUNITY)
Admission: EM | Admit: 2024-06-23 | Discharge: 2024-06-23 | Disposition: A | Discharge status: Home / Self Care | Attending: Emergency Medicine | Admitting: Emergency Medicine

## 2024-06-23 DIAGNOSIS — Z7982 Long term (current) use of aspirin: Secondary | ICD-10-CM | POA: Diagnosis not present

## 2024-06-23 DIAGNOSIS — Z79899 Other long term (current) drug therapy: Secondary | ICD-10-CM | POA: Insufficient documentation

## 2024-06-23 DIAGNOSIS — I723 Aneurysm of iliac artery: Secondary | ICD-10-CM | POA: Insufficient documentation

## 2024-06-23 DIAGNOSIS — J449 Chronic obstructive pulmonary disease, unspecified: Secondary | ICD-10-CM | POA: Insufficient documentation

## 2024-06-23 DIAGNOSIS — I509 Heart failure, unspecified: Secondary | ICD-10-CM | POA: Insufficient documentation

## 2024-06-23 DIAGNOSIS — K047 Periapical abscess without sinus: Secondary | ICD-10-CM | POA: Diagnosis not present

## 2024-06-23 DIAGNOSIS — R0602 Shortness of breath: Secondary | ICD-10-CM | POA: Diagnosis present

## 2024-06-23 DIAGNOSIS — R0609 Other forms of dyspnea: Secondary | ICD-10-CM | POA: Insufficient documentation

## 2024-06-23 DIAGNOSIS — I110 Hypertensive heart disease with heart failure: Secondary | ICD-10-CM | POA: Insufficient documentation

## 2024-06-23 DIAGNOSIS — Z7951 Long term (current) use of inhaled steroids: Secondary | ICD-10-CM | POA: Insufficient documentation

## 2024-06-23 DIAGNOSIS — Z7901 Long term (current) use of anticoagulants: Secondary | ICD-10-CM | POA: Diagnosis not present

## 2024-06-23 LAB — CBC WITH DIFFERENTIAL/PLATELET
Abs Immature Granulocytes: 0.01 K/uL (ref 0.00–0.07)
Basophils Absolute: 0 K/uL (ref 0.0–0.1)
Basophils Relative: 0 %
Eosinophils Absolute: 0.3 K/uL (ref 0.0–0.5)
Eosinophils Relative: 3 %
HCT: 43.6 % (ref 39.0–52.0)
Hemoglobin: 14.3 g/dL (ref 13.0–17.0)
Immature Granulocytes: 0 %
Lymphocytes Relative: 35 %
Lymphs Abs: 3.3 K/uL (ref 0.7–4.0)
MCH: 29.6 pg (ref 26.0–34.0)
MCHC: 32.8 g/dL (ref 30.0–36.0)
MCV: 90.3 fL (ref 80.0–100.0)
Monocytes Absolute: 1 K/uL (ref 0.1–1.0)
Monocytes Relative: 10 %
Neutro Abs: 4.8 K/uL (ref 1.7–7.7)
Neutrophils Relative %: 52 %
Platelets: 249 K/uL (ref 150–400)
RBC: 4.83 MIL/uL (ref 4.22–5.81)
RDW: 14.6 % (ref 11.5–15.5)
WBC: 9.4 K/uL (ref 4.0–10.5)
nRBC: 0 % (ref 0.0–0.2)

## 2024-06-23 LAB — COMPREHENSIVE METABOLIC PANEL WITH GFR
ALT: 16 U/L (ref 0–44)
AST: 22 U/L (ref 15–41)
Albumin: 4 g/dL (ref 3.5–5.0)
Alkaline Phosphatase: 83 U/L (ref 38–126)
Anion gap: 9 (ref 5–15)
BUN: 12 mg/dL (ref 8–23)
CO2: 27 mmol/L (ref 22–32)
Calcium: 9.2 mg/dL (ref 8.9–10.3)
Chloride: 107 mmol/L (ref 98–111)
Creatinine, Ser: 0.86 mg/dL (ref 0.61–1.24)
GFR, Estimated: >60 mL/min (ref >60)
Glucose, Bld: 101 mg/dL — ABNORMAL HIGH (ref 70–99)
Potassium: 4 mmol/L (ref 3.5–5.1)
Sodium: 142 mmol/L (ref 135–145)
Total Bilirubin: 0.5 mg/dL (ref 0.0–1.2)
Total Protein: 7.1 g/dL (ref 6.5–8.1)

## 2024-06-23 LAB — TROPONIN T, HIGH SENSITIVITY
Troponin T High Sensitivity: <15 ng/L (ref 0–19)
Troponin T High Sensitivity: <15 ng/L (ref 0–19)

## 2024-06-23 LAB — PRO BRAIN NATRIURETIC PEPTIDE: Pro Brain Natriuretic Peptide: <50 pg/mL (ref <300.0)

## 2024-06-23 LAB — MAGNESIUM: Magnesium: 2.2 mg/dL (ref 1.7–2.4)

## 2024-06-23 MED ORDER — AMOXICILLIN-POT CLAVULANATE 875-125 MG PO TABS
1.0000 | ORAL_TABLET | Freq: Once | ORAL | Status: AC
  Administered 2024-06-23: 1 via ORAL
  Filled 2024-06-23: qty 1

## 2024-06-23 MED ORDER — IOHEXOL 350 MG/ML SOLN
100.0000 mL | Freq: Once | INTRAVENOUS | Status: AC | PRN
  Administered 2024-06-23: 100 mL via INTRAVENOUS

## 2024-06-23 MED ORDER — OXYCODONE-ACETAMINOPHEN 5-325 MG PO TABS
1.0000 | ORAL_TABLET | Freq: Once | ORAL | Status: AC
  Administered 2024-06-23: 1 via ORAL
  Filled 2024-06-23: qty 1

## 2024-06-23 MED ORDER — AMOXICILLIN-POT CLAVULANATE 875-125 MG PO TABS: 1.0000 | ORAL_TABLET | Freq: Two times a day (BID) | ORAL | 0 refills | Status: AC

## 2024-06-23 NOTE — ED Notes (Signed)
 Pt's PaO2 is 95% on RA when ambulating. HR 75.

## 2024-06-23 NOTE — Telephone Encounter (Signed)
 Pt sent to ED by triage has an appt with Candis on 07/07/2024

## 2024-06-23 NOTE — Telephone Encounter (Signed)
 E2C2 Pulmonary Triage - Initial Assessment Questions "Chief Complaint (e.g., cough, sob, wheezing, fever, chills, sweat or additional symptoms) *Go to specific symptom protocol after initial questions. Sob rest/ exertion, productive cough  "How long have symptoms been present?" Week ago  Have you tested for COVID or Flu? Note: If not, ask patient if a home test can be taken. If so, instruct patient to call back for positive results. No  MEDICINES:   "Have you used any OTC meds to help with symptoms?" No If yes, ask "What medications?" na  "Have you used your inhalers/maintenance medication?" Yes If yes, "What medications?" albuterol     OXYGEN: "Do you wear supplemental oxygen?" No If yes, "How many liters are you supposed to use?" na  "Do you monitor your oxygen levels?" No If yes, What is your reading (oxygen level) today? na  What is your usual oxygen saturation reading?  (Note: Pulmonary O2 sats should be 90% or greater) Does not check   Copied from CRM [id number]. Topic: Clinical - Red Word Triage >> Jun 23, 2024 10:57 AM Lavanda D wrote: Red Word that prompted transfer to Nurse Triage: Patient is calling because he is experiencing SOB. He said it is mainly just that, but is also dealing with a gum infection. He has been 2x and might have to go back today due to a lot of pain. Reason for Disposition  [1] MODERATE difficulty breathing (e.g., speaks in phrases, SOB even at rest, pulse 100-120) AND [2] NEW-onset or WORSE than normal  Answer Assessment - Initial Assessment Questions Advised ED now.  Patient reports going to [facility]. Patient reports has to call transportation 7 days in advance for appts.  Advised patient to call pcp after ED eval.  1. RESPIRATORY STATUS: Describe your breathing? (e.g., wheezing, shortness of breath, unable to speak, severe coughing)      Sob seems like worsening, albuterol  not effective Sob with exertion, taking longer to clean  house, sob when bending over, sob at rest and with coughing. 2. ONSET: When did this breathing problem begin?      2 weeks ago 3. PATTERN Does the difficult breathing come and go, or has it been constant since it started?      constant 4. SEVERITY: How bad is your breathing? (e.g., mild, moderate, severe)      mild 5. RECURRENT SYMPTOM: Have you had difficulty breathing before? If Yes, ask: When was the last time? and What happened that time?      yes 6. CARDIAC HISTORY: Do you have any history of heart disease? (e.g., heart attack, angina, bypass surgery, angioplasty)      2 heart attack 7. LUNG HISTORY: Do you have any history of lung disease?  (e.g., pulmonary embolus, asthma, emphysema)     Blood clots in lungs 8. CAUSE: What do you think is causing the breathing problem?      unsure 9. OTHER SYMPTOMS: Do you have any other symptoms? (e.g., chest pain, cough, dizziness, fever, runny nose)     Denies dizziness, faint, chest pain, fever, chills, n/v Productive cough; unable to identify color; color blind 10. O2 SATURATION MONITOR:  Do you use an oxygen saturation monitor (pulse oximeter) at home? If Yes, ask: What is your reading (oxygen level) today? What is your usual oxygen saturation reading? (e.g., 95%) Does not check 12. TRAVEL: Have you traveled out of the country in the last month? (e.g., travel history, exposures)       no  Protocols used:  Breathing Difficulty-A-AH

## 2024-06-23 NOTE — Telephone Encounter (Signed)
 Call disconnected prior to warm transfer. Nurse will attempt to call patient.

## 2024-06-23 NOTE — Discharge Instructions (Addendum)
 Please use Tylenol  or ibuprofen  for pain.  You may use 600 mg ibuprofen  every 6 hours or 1000 mg of Tylenol  every 6 hours.  You may choose to alternate between the 2.  This would be most effective.  Not to exceed 4 g of Tylenol  within 24 hours.  Not to exceed 3200 mg ibuprofen  24 hours.  Please take the entire course of antibiotics that are prescribed, follow-up with your vascular surgeon, and cardiologist.  Follow-up with your dentist and return if you have significant worsening shortness of breath, chest pain.

## 2024-06-23 NOTE — ED Triage Notes (Addendum)
 Pt arrived via POV c/o worsening SOB, and recurrent dental infection. Pt reports he is on a blood thinner and does have a known AAA. Pt presents with dyspnea at rest and exertion and called his PCP who advised him to come to the ED for evaluation. Pt also presents with non-productive cough.

## 2024-06-23 NOTE — ED Provider Triage Note (Signed)
 Emergency Medicine Provider Triage Evaluation Note  Ruben Young , a 73 y.o. male  was evaluated in triage.  Pt complains of multiple complaints.  Review of Systems  Positive: Dental pain, gum swelling, exertional dyspnea, lower abdominal pain Negative: Fevers, chills, numbness, weakness  Physical Exam  BP 131/80 (BP Location: Right Arm)   Pulse 69   Temp 97.8 F (36.6 C) (Oral)   Resp 20   Ht 5' 8 (1.727 m)   Wt 79.8 kg   SpO2 95%   BMI 26.75 kg/m  Gen:   Awake, no distress   Resp:  Normal effort  MSK:   Moves extremities without difficulty  Other:  Swelling of right upper gums  Medical Decision Making  Medically screening exam initiated at 3:28 PM.  Appropriate orders placed.  Ruben Young was informed that the remainder of the evaluation will be completed by another provider, this initial triage assessment does not replace that evaluation, and the importance of remaining in the ED until their evaluation is complete.  Patient is presenting for multiple complaints.  He has had ongoing dental pain and gingival swelling.  He was seen in the ED 1 month ago for the same.  He was told that he needs 5 teeth extracted.  Unfortunately, his dentist referred him to a dental specialist who is unable to see him until February.  Patient also reports shortness of breath that is worse with exertion.  He states that this has been present since he was diagnosed with a PE.  Per chart review, this was a year ago.  He states that he has been adherent to his home Eliquis .  Patient also has been diagnosed with iliac aneurysms.  He states that he has had discomfort in his lower abdomen/groin area.   Ruben Motto, MD 06/23/24 [phone]

## 2024-06-23 NOTE — Telephone Encounter (Signed)
 Noted pt needs visit before letter can be written    Copied from CRM [id number]. Topic: General - Other >> Jun 23, 2024 10:56 AM Lavanda D wrote: Reason for CRM: Patient is calling because he would like a letter stating that due to his condition he is unable to do a whole lot of walking without getting very SOB. He mentioned that he is currently on probation and is required to visit his probation officer from time to time, he usually would try to take the bus but due to his trouble breathing he can not get there without having to turn back. Also transferred to NT for current symptoms of SOB. He will be calling back to provide the fax # for the probation officer. >> Jun 23, 2024 11:08 AM Almarie PENNER wrote: Called patient to schedule overdue f/u. He is seeing Candis on 12/8. Informed him that we will be unable to provide letter until he is seen.

## 2024-06-23 NOTE — ED Provider Notes (Signed)
 [place] EM[facility]GENCY DEPARTMENT AT [facility] Provider Note   CSN: [id number] Arrival date & time: 06/23/24  1454     Patient presents with: Shortness of Breath   Ruben Young is a 73 y.o. male with past medical history significant for heart failure, hyperlipidemia, cardiomyopathy, PE, COPD, previous iliac artery aneurysm, previous AAA which is currently stable who presents concern for worsening shortness of breath from baseline, recurrent dental infection.  Endorses shortness of breath at rest, and with exertion.  He also endorses some nonproductive cough.    Shortness of Breath      Prior to Admission medications   Medication Sig Start Date End Date Taking? Authorizing Provider  amoxicillin -clavulanate (AUGMENTIN ) 875-125 MG tablet Take 1 tablet by mouth every 12 (twelve) hours. 06/23/24  Yes Abie Killian H, PA-C  albuterol  (VENTOLIN  HFA) 108 (90 Base) MCG/ACT inhaler Inhale 2 puffs into the lungs every 4 (four) hours as needed for wheezing or shortness of breath. 12/05/22   Jude Harden GAILS, MD  amLODipine  (NORVASC ) 10 MG tablet TAKE 1 TABLET(10 MG) BY MOUTH DAILY 05/23/23   Jeffrie Oneil BROCKS, MD  apixaban  (ELIQUIS ) 5 MG TABS tablet Take 1 tablet (5 mg total) by mouth 2 (two) times daily. 03/07/24   Jude Harden GAILS, MD  Ascorbic Acid (VITAMIN C PO) Take 1,000 mg by mouth every other day. Patient not taking: Reported on 09/12/2023    [provider]  aspirin  EC 81 MG tablet Take 81 mg by mouth daily.    [provider]  atorvastatin  (LIPITOR) 40 MG tablet TAKE 1 TABLET BY MOUTH DAILY AT 6 PM 11/27/23   Jeffrie Oneil BROCKS, MD  B Complex Vitamins (B COMPLEX PO) Take 1 capsule by mouth every other day.    [provider]  CALCIUM  PO Take 1 tablet by mouth every other day.    [provider]  carvedilol  (COREG ) 6.25 MG tablet Take 1 tablet (6.25 mg total) by mouth 2 (two) times daily with a meal. Please call [phone] to schedule an  appointment for future refills. Thank you. 01/30/24   Jeffrie Oneil BROCKS, MD  Cholecalciferol (VITAMIN D3) 25 MCG (1000 UT) CAPS Take 1,000 Units by mouth daily.     [provider]  ferrous sulfate 325 (65 FE) MG EC tablet Take 325 mg by mouth every other day.    [provider]  Fluticasone-Umeclidin-Vilant (TRELEGY ELLIPTA ) 100-62.5-25 MCG/ACT AEPB Inhale 100 mcg into the lungs daily. 09/12/23   Jude Harden GAILS, MD  Fluticasone-Umeclidin-Vilant (TRELEGY ELLIPTA ) 100-62.5-25 MCG/ACT AEPB INHALE 1 PUFF INTO THE LUNGS DAILY 05/19/24   Alva, Rakesh V, MD  GINSENG PO Take 1 capsule by mouth every other day. Patient not taking: Reported on 09/12/2023    [provider]  hydrochlorothiazide (HYDRODIURIL) 25 MG tablet Take 25 mg by mouth daily. Patient not taking: Reported on 09/12/2023 07/18/22   [provider]  HYDROcodone -acetaminophen  (NORCO/VICODIN) 5-325 MG tablet Take 1 tablet by mouth every 6 (six) hours as needed for severe pain (pain score 7-10). 05/20/24   Idol, Julie, PA-C  hydrocortisone cream 1 % Apply 1 Application topically daily as needed for itching.    [provider]  Multiple Vitamin (MULTIVITAMIN WITH MINERALS) TABS tablet Take 1 tablet by mouth daily.    [provider]  nitroGLYCERIN  (NITROSTAT ) 0.4 MG SL tablet Place 1 tablet (0.4 mg total) under the tongue every 5 (five) minutes x 3 doses as needed for chest pain. For chest pains  05/05/21   Jeffrie Oneil BROCKS, MD  Omega-3 Fatty Acids (FISH OIL) 1000 MG CAPS Take 1,000 mg by mouth every other day.    [provider]  POTASSIUM PO Take 1 tablet by mouth every other day.    [provider]  sulfamethoxazole-trimethoprim (BACTRIM DS) 800-160 MG tablet Take 1 tablet by mouth 2 (two) times daily. 03/29/23   [provider]  vitamin E 400 UNIT capsule Take 400 Units by mouth every other day.    [provider]    Allergies: Patient has no known allergies.     Review of Systems  Respiratory:  Positive for shortness of breath.   All other systems reviewed and are negative.   Updated Vital Signs BP (!) 142/82 (BP Location: Right Arm)   Pulse 67   Temp 98.5 F (36.9 C) (Oral)   Resp 20   Ht 5' 8 (1.727 m)   Wt 79.8 kg   SpO2 94%   BMI 26.75 kg/m   Physical Exam Vitals and nursing note reviewed.  Constitutional:      General: He is not in acute distress.    Appearance: Normal appearance.  HENT:     Head: Normocephalic and atraumatic.     Comments: Poor dentition throughout, some focal gum swelling with expressed pus superior to the right upper incisors, No trismus, no posterior oropharyngeal swelling, uvula midline.  No induration of neck, no floor of mouth swelling. Eyes:     General:        Right eye: No discharge.        Left eye: No discharge.  Cardiovascular:     Rate and Rhythm: Normal rate and regular rhythm.  Pulmonary:     Effort: Pulmonary effort is normal. No respiratory distress.     Comments: Mildly decreased breath sounds with no increased work of breathing, no wheezing, rhonchi, stridor, rales. Musculoskeletal:        General: No deformity.  Skin:    General: Skin is warm and dry.  Neurological:     Mental Status: He is alert and oriented to person, place, and time.  Psychiatric:        Mood and Affect: Mood normal.        Behavior: Behavior normal.     (all labs ordered are listed, but only abnormal results are displayed) Labs Reviewed  COMPREHENSIVE METABOLIC PANEL WITH GFR - Abnormal; Notable for the following components:      Result Value   Glucose, Bld 101 (*)    All other components within normal limits  CBC WITH DIFFERENTIAL/PLATELET  PRO BRAIN NATRIURETIC PEPTIDE  MAGNESIUM  TROPONIN T, HIGH SENSITIVITY  TROPONIN T, HIGH SENSITIVITY    EKG: EKG Interpretation Date/Time:  Monday June 23 2024 15:43:30 EST Ventricular Rate:  71 PR Interval:  142 QRS Duration:  84 QT  Interval:  386 QTC Calculation: 419 R Axis:   68  Text Interpretation: Normal sinus rhythm with sinus arrhythmia Confirmed by Melvenia Motto [phone]) on 06/23/2024 6:46:33 PM  Radiology: CT Angio Chest/Abd/Pel for Dissection W and/or Wo Contrast Result Date: 06/23/2024 EXAM: CTA CHEST, ABDOMEN AND PELVIS WITHOUT AND WITH CONTRAST 06/23/2024 07:38:46 PM TECHNIQUE: CTA of the chest was performed without and with the administration of 100 mL of intravenous iohexol  (OMNIPAQUE ) 350 MG/ML injection. CTA of the abdomen and pelvis was performed without and with the administration of 100 mL of intravenous iohexol  (OMNIPAQUE ) 350 MG/ML injection. Multiplanar reformatted images are provided for review. MIP images  are provided for review. Automated exposure control, iterative reconstruction, and/or weight based adjustment of the mA/kV was utilized to reduce the radiation dose to as low as reasonably achievable. COMPARISON: 11/22/2023 CLINICAL HISTORY: Acute aortic syndrome (AAS) suspected. FINDINGS: VASCULATURE: AORTA: Aortic atherosclerosis. No aortic dissection. No abdominal aortic aneurysm. PULMONARY ARTERIES: No pulmonary embolism with the limits of this exam. GREAT VESSELS OF AORTIC ARCH: No acute finding. No dissection. No arterial occlusion or significant stenosis. CELIAC TRUNK: No acute finding. No occlusion or significant stenosis. SUPERIOR MESENTERIC ARTERY: No acute finding. No occlusion or significant stenosis. INFERIOR MESENTERIC ARTERY: No acute finding. No occlusion or significant stenosis. RENAL ARTERIES: No acute finding. No occlusion or significant stenosis. ILIAC ARTERIES: Aneurysmal dilatation of the right common iliac artery measuring 2.3 cm. Aneurysmal dilatation of the left common iliac artery measuring 1.8 cm. Extensive plaque within the common iliac arteries bilaterally. Mild narrowing of the common iliac lumen arteries bilaterally. CHEST: MEDIASTINUM: Calcifications in the left anterior descending  and right coronary arteries. No mediastinal lymphadenopathy. The heart and pericardium demonstrate no acute abnormality. LUNGS AND PLEURA: Mild centrilobular emphysema. No focal consolidation or pulmonary edema. No evidence of pleural effusion or pneumothorax. THORACIC BONES AND SOFT TISSUES: No acute bone or soft tissue abnormality. ABDOMEN AND PELVIS: LIVER: The liver is unremarkable. GALLBLADDER AND BILE DUCTS: Gallbladder is unremarkable. No biliary ductal dilatation. SPLEEN: The spleen is unremarkable. PANCREAS: The pancreas is unremarkable. ADRENAL GLANDS: Bilateral adrenal glands demonstrate no acute abnormality. KIDNEYS, URETERS AND BLADDER: 2.2 cm cyst in the mid pole of the right kidney. No stones in the kidneys or ureters. No hydronephrosis. No perinephric or periureteral stranding. Urinary bladder is unremarkable. GI AND BOWEL: Sigmoid diverticulosis. Normal appendix. Stomach and duodenal sweep demonstrate no acute abnormality. There is no bowel obstruction. No abnormal bowel wall thickening or distension. REPRODUCTIVE: Prostate enlargement. PERITONEUM AND RETROPERITONEUM: No ascites or free air. LYMPH NODES: No lymphadenopathy. ABDOMINAL BONES AND SOFT TISSUES: No acute abnormality of the bones. No acute soft tissue abnormality. IMPRESSION: 1. No evidence of acute aortic syndrome or pulmonary embolism. 2. Right common iliac artery aneurysm measuring 2.3 cm and left common iliac artery ectasia measuring 1.8 cm, with extensive plaque and mild bilateral luminal narrowing; recommend vascular specialist follow-up given bilateral iliac aneurysmal disease. Electronically signed by: Franky Crease MD 06/23/2024 08:07 PM EST RP Workstation: [id number]   DG Chest 2 View Result Date: 06/23/2024 CLINICAL DATA:  Short of breath, cough EXAM: CHEST - 2 VIEW COMPARISON:  06/30/2022 FINDINGS: The heart size and mediastinal contours are within normal limits. Both lungs are clear. The visualized skeletal structures are  unremarkable. IMPRESSION: No active cardiopulmonary disease. Electronically Signed   By: Ozell Daring M.D.   On: 06/23/2024 16:22     Procedures   Medications Ordered in the ED  amoxicillin -clavulanate (AUGMENTIN ) 875-125 MG per tablet 1 tablet (has no administration in time range)  oxyCODONE -acetaminophen  (PERCOCET/ROXICET) 5-325 MG per tablet 1 tablet (1 tablet Oral Given 06/23/24 1541)  iohexol  (OMNIPAQUE ) 350 MG/ML injection 100 mL (100 mLs Intravenous Contrast Given 06/23/24 1929)                                    Medical Decision Making Amount and/or Complexity of Data Reviewed Labs: ordered. Radiology: ordered.  Risk Prescription drug management.   This patient is a 73 y.o. male  who presents to the ED for concern of shob, dental infection.  Differential diagnoses prior to evaluation: The emergent differential diagnosis includes, but is not limited to,  asthma exacerbation, COPD exacerbation, acute upper respiratory infection, acute bronchitis, chronic bronchitis, interstitial lung disease, ARDS, PE, pneumonia, atypical ACS, carbon monoxide poisoning, spontaneous pneumothorax, new CHF vs CHF exacerbation, versus other, dental infection, dental abscess, versus other. This is not an exhaustive differential.   Past Medical History / Co-morbidities / Social History: heart failure, hyperlipidemia, cardiomyopathy, PE, COPD, previous iliac artery aneurysm, previous AAA  Additional history: Chart reviewed. Pertinent results include: Reviewed lab work, imaging from previous emergency department visits, remote echo from 2023  Physical Exam: Physical exam performed. The pertinent findings include: Vital signs overall stable, oxygen saturation is 94% on room air while resting.  Mild hypertension, blood pressure 142/82.  No tachycardia.  Afebrile.    Poor dentition throughout, some focal gum swelling with expressed pus superior to the right upper incisors, No trismus, no posterior  oropharyngeal swelling, uvula midline.  No induration of neck, no floor of mouth swelling.  Mildly decreased breath sounds with no increased work of breathing, no wheezing, rhonchi, stridor, rales.  Ambulatory pulse ox shows 95 on room air, with heart rate of 74.  Lab Tests/Imaging studies: I personally interpreted labs/imaging and the pertinent results include: CBC unremarkable, CMP unremarkable, BNP unremarkable, normal magnesium, negative troponin x 2 in context of no ongoing chest pain.  CT angio chest dissection study shows no evidence of acute intrathoracic abnormality, he has  2. Right common iliac artery aneurysm measuring 2.3 cm and left common iliac  artery ectasia measuring 1.8 cm, with extensive plaque and mild bilateral  luminal narrowing; recommend vascular specialist follow-up given bilateral  iliac aneurysmal disease.   . I agree with the radiologist interpretation.  Cardiac monitoring: EKG obtained and interpreted by myself and attending physician which shows: Normal sinus rhythm with some sinus arrhythmia   Medications: I ordered medication including Percocet for pain, Augmentin  for dental infection.  I have reviewed the patients home medicines and have made adjustments as needed.   Disposition: After consideration of the diagnostic results and the patients response to treatment, I feel that patient is stable for discharge with plan as above, no emergent cause identified for his symptoms today, antibiotics prescribed for dental pain, encouraged cardiology, vascular follow-up.   emergency department workup does not suggest an emergent condition requiring admission or immediate intervention beyond what has been performed at this time. The plan is: as above. The patient is safe for discharge and has been instructed to return immediately for worsening symptoms, change in symptoms or any other concerns.   Final diagnoses:  Dyspnea on exertion  Dental infection  Iliac artery  aneurysm, bilateral    ED Discharge Orders          Ordered    Ambulatory referral to Cardiology        06/23/24 2056    amoxicillin -clavulanate (AUGMENTIN ) 875-125 MG tablet  Every 12 hours        06/23/24 2056               Rosan Sherlean VEAR DEVONNA 06/23/24 2057    Melvenia Motto, MD 06/23/24 2252

## 2024-06-30 ENCOUNTER — Other Ambulatory Visit: Payer: Self-pay | Admitting: Cardiology

## 2024-07-01 ENCOUNTER — Other Ambulatory Visit (HOSPITAL_BASED_OUTPATIENT_CLINIC_OR_DEPARTMENT_OTHER): Payer: Self-pay | Admitting: Pulmonary Disease

## 2024-07-05 ENCOUNTER — Other Ambulatory Visit: Payer: Self-pay | Admitting: Cardiology

## 2024-07-07 ENCOUNTER — Encounter (HOSPITAL_BASED_OUTPATIENT_CLINIC_OR_DEPARTMENT_OTHER): Payer: Self-pay

## 2024-07-07 ENCOUNTER — Ambulatory Visit (INDEPENDENT_AMBULATORY_CARE_PROVIDER_SITE_OTHER)

## 2024-07-07 VITALS — BP 145/69 | HR 73 | Ht 68.0 in | Wt 176.0 lb

## 2024-07-07 DIAGNOSIS — F17210 Nicotine dependence, cigarettes, uncomplicated: Secondary | ICD-10-CM

## 2024-07-07 DIAGNOSIS — J432 Centrilobular emphysema: Secondary | ICD-10-CM

## 2024-07-07 DIAGNOSIS — J441 Chronic obstructive pulmonary disease with (acute) exacerbation: Secondary | ICD-10-CM

## 2024-07-07 MED ORDER — PREDNISONE 10 MG PO TABS: ORAL_TABLET | ORAL | 0 refills | Status: AC

## 2024-07-07 MED ORDER — AZITHROMYCIN 250 MG PO TABS: ORAL_TABLET | ORAL | 0 refills | Status: AC

## 2024-07-07 NOTE — Progress Notes (Signed)
 @Patient  ID: Ruben Young, male    DOB: 21-Mar-1951, 73 y.o.   MRN: [id number]  Chief Complaint  Patient presents with   Follow-up    Referring provider: Leigh Lung, MD  HPI: Discussed the use of AI scribe software for clinical note transcription with the patient, who gave verbal consent to proceed.  History of Present Illness Ruben Young is a 73 year old male with COPD who presents with worsening shortness of breath.  He experiences shortness of breath upon exertion, describing his breathing as 'a little shallow'. This worsens with activities such as walking uphill or catching a bus. He reports that his oxygen levels were recorded at 95% during mild activity.  He was seen in the emergency department at the end of November for a dental infection, treated with Augmentin . During that visit, he underwent a CT scan and chest x-ray.  He uses albuterol  approximately three times a day, more frequently than usual, and Trelegy once or twice daily. Despite this, he does not notice significant improvement in his symptoms. He has a persistent cough with mucus production, though he is unsure of the color of the mucus.  No chest pain, fever, or chills. He mentions having an aneurysm in his groin that causes occasional discomfort. He has noticed a decline in his ability to perform household tasks, which now take significantly longer due to his need to rest frequently.  He is concerned about his breathing feeling similar to when he had a heart attack and has inquired about checking his arteries for blockages.  He was referred to Cardiology for these concerns at his ER visit at the end of November.  He has not scheduled this follow up yet.  Last OV 09/12/2023: 73 yo ex-smoker for follow-up of COPD and PE 06/2022    PMH - AAA - 2.5 cm 05/2022 CAD HFrEF -EF 50 to 55% 06/2022 Hosp 06/2022 for PE,LLE DVT   He smoked half to 1 pack/day until he quit 05/2022, about 50 pack years     Initial OV 09/2022 >> given sample of breztri  for persistent shortness of breath.  Desaturated to 93% on walking   The patient, with a history of COPD and PE, presents with ongoing shortness of breath and wheezing. He reports that these symptoms are sometimes exacerbated by cold weather and physical exertion. Despite these symptoms, he is generally compliant with his prescribed apixaban  and albuterol  inhaler. He admits to occasional cigarette use when he sees others smoking, but otherwise reports being abstinent. He has previously tried a different inhaler, but does not recall its effects. He has also participated in an exercise program at his local hospital, but did not find it particularly beneficial.   The patient's PE was diagnosed after he experienced severe shortness of breath following minor physical activity. He was advised by a friend to seek medical attention due to his abnormal breathing. He has not traveled recently and there was no clear cause for the PE. He is aware that he may need to be on blood thinners indefinitely due to the risk of recurrence.   Significant tests/ events reviewed   PFTs 11/2022 moderate airway obstruction ratio 64, post BD FEV1 58%, no BD response, DLCO 13.4/54% normal TLC   CTA chest 06/2022 bilateral upper and lower lobe segmental and subsegmental pulmonary emboli, no RV strain Venous duplex left popliteal DVT   12/2 Echo 50-55%, no WMA, normal RVF  NPSG 01/2018 neg, desat to 89%   06/23/2024 Chest/Abdomen/Pelvis  CT: IMPRESSION: 1. No evidence of acute aortic syndrome or pulmonary embolism. 2. Right common iliac artery aneurysm measuring 2.3 cm and left common iliac artery ectasia measuring 1.8 cm, with extensive plaque and mild bilateral luminal narrowing; recommend vascular specialist follow-up given bilateral iliac aneurysmal disease.   No Known Allergies  Immunization History  Administered Date(s) Administered   INFLUENZA, HIGH DOSE SEASONAL PF  05/31/2018, 03/31/2022   Moderna SARS-COV2 Booster Vaccination 06/10/2020   Moderna Sars-Covid-2 Vaccination 10/11/2019, 11/12/2019    Past Medical History:  Diagnosis Date   AAA (abdominal aortic aneurysm)    DDD (degenerative disc disease), lumbar    DVT (deep venous thrombosis) (HCC)    Dyspnea    Hyperlipidemia    Hypertension    MI (myocardial infarction) (HCC)    2018   Old myocardial infarction    Pulmonary emboli (HCC)    Tobacco use disorder     Tobacco History: Social History   Tobacco Use  Smoking Status Every Day   Current packs/day: 0.50   Average packs/day: 1 pack/day for 59.4 years (58.6 ttl pk-yrs)   Types: Cigarettes   Start date: 1966   Last attempt to quit: 05/2022   Passive exposure: Past  Smokeless Tobacco Never   Ready to quit: Not Answered Counseling given: Not Answered   Outpatient Medications Prior to Visit  Medication Sig Dispense Refill   albuterol  (VENTOLIN  HFA) 108 (90 Base) MCG/ACT inhaler Inhale 2 puffs into the lungs every 4 (four) hours as needed for wheezing or shortness of breath. 6.7 g 1   amLODipine  (NORVASC ) 10 MG tablet TAKE 1 TABLET(10 MG) BY MOUTH DAILY 90 tablet 3   amoxicillin -clavulanate (AUGMENTIN ) 875-125 MG tablet Take 1 tablet by mouth every 12 (twelve) hours. 14 tablet 0   Ascorbic Acid (VITAMIN C PO) Take 1,000 mg by mouth every other day.     aspirin  EC 81 MG tablet Take 81 mg by mouth daily.     atorvastatin  (LIPITOR) 40 MG tablet TAKE 1 TABLET BY MOUTH DAILY AT 6 PM 90 tablet 1   B Complex Vitamins (B COMPLEX PO) Take 1 capsule by mouth every other day.     CALCIUM  PO Take 1 tablet by mouth every other day.     carvedilol  (COREG ) 6.25 MG tablet Take 1 tablet (6.25 mg total) by mouth 2 (two) times daily with a meal. 60 tablet 0   Cholecalciferol (VITAMIN D3) 25 MCG (1000 UT) CAPS Take 1,000 Units by mouth daily.      ELIQUIS  5 MG TABS tablet TAKE 1 TABLET(5 MG) BY MOUTH TWICE DAILY 60 tablet 2   ferrous sulfate 325  (65 FE) MG EC tablet Take 325 mg by mouth every other day.     Fluticasone-Umeclidin-Vilant (TRELEGY ELLIPTA ) 100-62.5-25 MCG/ACT AEPB Inhale 100 mcg into the lungs daily.     Fluticasone-Umeclidin-Vilant (TRELEGY ELLIPTA ) 100-62.5-25 MCG/ACT AEPB INHALE 1 PUFF INTO THE LUNGS DAILY 60 each 3   GINSENG PO Take 1 capsule by mouth every other day.     hydrochlorothiazide (HYDRODIURIL) 25 MG tablet Take 25 mg by mouth daily.     HYDROcodone -acetaminophen  (NORCO/VICODIN) 5-325 MG tablet Take 1 tablet by mouth every 6 (six) hours as needed for severe pain (pain score 7-10). 12 tablet 0   hydrocortisone cream 1 % Apply 1 Application topically daily as needed for itching.     Multiple Vitamin (MULTIVITAMIN WITH MINERALS) TABS tablet Take 1 tablet by mouth daily.     nitroGLYCERIN  (NITROSTAT ) 0.4 MG  SL tablet Place 1 tablet (0.4 mg total) under the tongue every 5 (five) minutes x 3 doses as needed for chest pain. For chest pains 25 tablet 0   Omega-3 Fatty Acids (FISH OIL) 1000 MG CAPS Take 1,000 mg by mouth every other day.     POTASSIUM PO Take 1 tablet by mouth every other day.     sulfamethoxazole-trimethoprim (BACTRIM DS) 800-160 MG tablet Take 1 tablet by mouth 2 (two) times daily.     vitamin E 400 UNIT capsule Take 400 Units by mouth every other day.     No facility-administered medications prior to visit.     Review of Systems: as per hpi  Constitutional:   No  weight loss, night sweats,  Fevers, chills, fatigue, or  lassitude.  HEENT:   No headaches,  Difficulty swallowing,  Tooth/dental problems, or  Sore throat,                No sneezing, itching, ear ache, nasal congestion, post nasal drip,   CV:  No chest pain,  Orthopnea, PND, swelling in lower extremities, anasarca, dizziness, palpitations, syncope.   GI  No heartburn, indigestion, abdominal pain, nausea, vomiting, diarrhea, change in bowel habits, loss of appetite, bloody stools.   Resp: No shortness of breath with exertion or  at rest.  No excess mucus, no productive cough,  No non-productive cough,  No coughing up of blood.  No change in color of mucus.  No wheezing.  No chest wall deformity  Skin: no rash or lesions.  GU: no dysuria, change in color of urine, no urgency or frequency.  No flank pain, no hematuria   MS:  No joint pain or swelling.  No decreased range of motion.  No back pain.    Physical Exam  BP (!) 145/69   Pulse 73   Ht 5' 8 (1.727 m)   Wt 176 lb (79.8 kg)   SpO2 92%   BMI 26.76 kg/m   GEN: A/Ox3; pleasant , NAD, well nourished    HEENT:  [place]/AT,  EACs-clear, TMs-wnl, NOSE-clear, THROAT-clear, no lesions, no postnasal drip or exudate noted.   NECK:  Supple w/ fair ROM; no JVD; normal carotid impulses w/o bruits; no thyromegaly or nodules palpated; no lymphadenopathy.    RESP  Clear  but diminished throughout P & A; w/o, wheezes/ rales/ or rhonchi. no accessory muscle use, no dullness to percussion  CARD:  RRR, no m/r/g, no peripheral edema, pulses intact, no cyanosis or clubbing.  GI:   Soft & nt; nml bowel sounds; no organomegaly or masses detected.   Musco: Warm bil, no deformities or joint swelling noted.   Neuro: alert, no focal deficits noted.    Skin: Warm, no lesions or rashes    Lab Results:  CBC    Component Value Date/Time   WBC 9.4 06/23/2024 1556   RBC 4.83 06/23/2024 1556   HGB 14.3 06/23/2024 1556   HCT 43.6 06/23/2024 1556   PLT 249 06/23/2024 1556   MCV 90.3 06/23/2024 1556   MCH 29.6 06/23/2024 1556   MCHC 32.8 06/23/2024 1556   RDW 14.6 06/23/2024 1556   LYMPHSABS 3.3 06/23/2024 1556   MONOABS 1.0 06/23/2024 1556   EOSABS 0.3 06/23/2024 1556   BASOSABS 0.0 06/23/2024 1556    BMET    Component Value Date/Time   NA 142 06/23/2024 1556   K 4.0 06/23/2024 1556   CL 107 06/23/2024 1556   CO2 27 06/23/2024 1556  GLUCOSE 101 (H) 06/23/2024 1556   BUN 12 06/23/2024 1556   CREATININE 0.86 06/23/2024 1556   CALCIUM  9.2 06/23/2024 1556    GFRNONAA >60 06/23/2024 1556   GFRAA >60 03/28/2020 1309    BNP    Component Value Date/Time   BNP 34.0 06/30/2022 1514    ProBNP    Component Value Date/Time   PROBNP <50.0 06/23/2024 1556    Imaging: CT Angio Chest/Abd/Pel for Dissection W and/or Wo Contrast Result Date: 06/23/2024 EXAM: CTA CHEST, ABDOMEN AND PELVIS WITHOUT AND WITH CONTRAST 06/23/2024 07:38:46 PM TECHNIQUE: CTA of the chest was performed without and with the administration of 100 mL of intravenous iohexol  (OMNIPAQUE ) 350 MG/ML injection. CTA of the abdomen and pelvis was performed without and with the administration of 100 mL of intravenous iohexol  (OMNIPAQUE ) 350 MG/ML injection. Multiplanar reformatted images are provided for review. MIP images are provided for review. Automated exposure control, iterative reconstruction, and/or weight based adjustment of the mA/kV was utilized to reduce the radiation dose to as low as reasonably achievable. COMPARISON: 11/22/2023 CLINICAL HISTORY: Acute aortic syndrome (AAS) suspected. FINDINGS: VASCULATURE: AORTA: Aortic atherosclerosis. No aortic dissection. No abdominal aortic aneurysm. PULMONARY ARTERIES: No pulmonary embolism with the limits of this exam. GREAT VESSELS OF AORTIC ARCH: No acute finding. No dissection. No arterial occlusion or significant stenosis. CELIAC TRUNK: No acute finding. No occlusion or significant stenosis. SUPERIOR MESENTERIC ARTERY: No acute finding. No occlusion or significant stenosis. INFERIOR MESENTERIC ARTERY: No acute finding. No occlusion or significant stenosis. RENAL ARTERIES: No acute finding. No occlusion or significant stenosis. ILIAC ARTERIES: Aneurysmal dilatation of the right common iliac artery measuring 2.3 cm. Aneurysmal dilatation of the left common iliac artery measuring 1.8 cm. Extensive plaque within the common iliac arteries bilaterally. Mild narrowing of the common iliac lumen arteries bilaterally. CHEST: MEDIASTINUM: Calcifications in  the left anterior descending and right coronary arteries. No mediastinal lymphadenopathy. The heart and pericardium demonstrate no acute abnormality. LUNGS AND PLEURA: Mild centrilobular emphysema. No focal consolidation or pulmonary edema. No evidence of pleural effusion or pneumothorax. THORACIC BONES AND SOFT TISSUES: No acute bone or soft tissue abnormality. ABDOMEN AND PELVIS: LIVER: The liver is unremarkable. GALLBLADDER AND BILE DUCTS: Gallbladder is unremarkable. No biliary ductal dilatation. SPLEEN: The spleen is unremarkable. PANCREAS: The pancreas is unremarkable. ADRENAL GLANDS: Bilateral adrenal glands demonstrate no acute abnormality. KIDNEYS, URETERS AND BLADDER: 2.2 cm cyst in the mid pole of the right kidney. No stones in the kidneys or ureters. No hydronephrosis. No perinephric or periureteral stranding. Urinary bladder is unremarkable. GI AND BOWEL: Sigmoid diverticulosis. Normal appendix. Stomach and duodenal sweep demonstrate no acute abnormality. There is no bowel obstruction. No abnormal bowel wall thickening or distension. REPRODUCTIVE: Prostate enlargement. PERITONEUM AND RETROPERITONEUM: No ascites or free air. LYMPH NODES: No lymphadenopathy. ABDOMINAL BONES AND SOFT TISSUES: No acute abnormality of the bones. No acute soft tissue abnormality. IMPRESSION: 1. No evidence of acute aortic syndrome or pulmonary embolism. 2. Right common iliac artery aneurysm measuring 2.3 cm and left common iliac artery ectasia measuring 1.8 cm, with extensive plaque and mild bilateral luminal narrowing; recommend vascular specialist follow-up given bilateral iliac aneurysmal disease. Electronically signed by: Franky Crease MD 06/23/2024 08:07 PM EST RP Workstation: [id number]   DG Chest 2 View Result Date: 06/23/2024 CLINICAL DATA:  Short of breath, cough EXAM: CHEST - 2 VIEW COMPARISON:  06/30/2022 FINDINGS: The heart size and mediastinal contours are within normal limits. Both lungs are clear. The  visualized skeletal structures are unremarkable.  IMPRESSION: No active cardiopulmonary disease. Electronically Signed   By: Ozell Daring M.D.   On: 06/23/2024 16:22    Administration History     None          Latest Ref Rng & Units 12/05/2022    1:04 PM  PFT Results  FVC-Pre L 2.52   FVC-Predicted Pre % 60   FVC-Post L 2.71   FVC-Predicted Post % 65   Pre FEV1/FVC % % 64   Post FEV1/FCV % % 65   FEV1-Pre L 1.60   FEV1-Predicted Pre % 53   FEV1-Post L 1.76   DLCO uncorrected ml/min/mmHg 13.39   DLCO UNC% % 54   DLCO corrected ml/min/mmHg 13.39   DLCO COR %Predicted % 54   DLVA Predicted % 71   TLC L 5.75   TLC % Predicted % 84   RV % Predicted % 118     No results found for: NITRICOXIDE   Assessment & Plan:   Assessment & Plan Centrilobular emphysema (HCC)  COPD with acute exacerbation (HCC)  Assessment and Plan Assessment & Plan Chronic obstructive pulmonary disease with acute exacerbation and centrilobular emphysema Acute COPD exacerbation with increased dyspnea, unresponsive to increased Trelegy. Differential includes cardiac causes due to persistent dyspnea.  H/H normal on recent lab work. - Plan for treatment of COPD exacerbation with Zpak and steroid taper; if shortness of breath persists, this will be more concerning for a cardiac source of shortness of breath. - Continue Trelegy once daily. - Use albuterol  as needed. - Follow up with Cardiology to r/o cardiac causes of dyspnea.   - Follow up with ED if acute worsening of shortness of breath, or associated symptoms of chest pain, nausea, diaphoresis    Return in about 5 weeks (around 08/11/2024) for COPD.  Candis Dandy, PA-C 07/07/2024

## 2024-07-07 NOTE — Patient Instructions (Signed)
 Start Prednisone  and Zpak- finish all medication.  Follow up with Cardiology as discussed.  Return to clinic in 4-6 weeks.

## 2024-07-21 ENCOUNTER — Other Ambulatory Visit: Payer: Self-pay

## 2024-07-21 DIAGNOSIS — I723 Aneurysm of iliac artery: Secondary | ICD-10-CM

## 2024-08-11 ENCOUNTER — Encounter (HOSPITAL_BASED_OUTPATIENT_CLINIC_OR_DEPARTMENT_OTHER): Payer: Self-pay

## 2024-08-11 ENCOUNTER — Ambulatory Visit (HOSPITAL_BASED_OUTPATIENT_CLINIC_OR_DEPARTMENT_OTHER)

## 2024-08-11 ENCOUNTER — Ambulatory Visit (INDEPENDENT_AMBULATORY_CARE_PROVIDER_SITE_OTHER)

## 2024-08-11 VITALS — BP 143/78 | HR 64 | Ht 68.0 in | Wt 177.0 lb

## 2024-08-11 DIAGNOSIS — J432 Centrilobular emphysema: Secondary | ICD-10-CM | POA: Diagnosis not present

## 2024-08-11 DIAGNOSIS — Z720 Tobacco use: Secondary | ICD-10-CM

## 2024-08-11 DIAGNOSIS — F17210 Nicotine dependence, cigarettes, uncomplicated: Secondary | ICD-10-CM

## 2024-08-11 DIAGNOSIS — R0602 Shortness of breath: Secondary | ICD-10-CM

## 2024-08-11 DIAGNOSIS — J449 Chronic obstructive pulmonary disease, unspecified: Secondary | ICD-10-CM | POA: Diagnosis not present

## 2024-08-11 MED ORDER — TRELEGY ELLIPTA 100-62.5-25 MCG/ACT IN AEPB: 1.0000 | INHALATION_SPRAY | Freq: Every day | RESPIRATORY_TRACT | 3 refills | Status: AC

## 2024-08-11 MED ORDER — ALBUTEROL SULFATE HFA 108 (90 BASE) MCG/ACT IN AERS: 2.0000 | INHALATION_SPRAY | RESPIRATORY_TRACT | 1 refills | Status: AC | PRN

## 2024-08-11 NOTE — Progress Notes (Signed)
 "  @Patient  ID: Ruben Young, male    DOB: [birth year], 74 y.o.   MRN: [id number]  Chief Complaint  Patient presents with   Follow-up    Referring provider: Leigh Lung, MD  HPI: Discussed the use of AI scribe software for clinical note transcription with the patient, who gave verbal consent to proceed.  History of Present Illness Ruben Young is a 74 year old male with chronic obstructive pulmonary disease (COPD) who presents with persistent shortness of breath.  He completed a course of antibiotics and steroids prescribed during his last visit and reports that he does feel better than at his last visit.  He experiences persistent shortness of breath, particularly during physical activities such as walking or climbing stairs. He becomes breathless after walking up stairs and has difficulty breathing when bending over to tie his shoes. His symptoms are reminiscent of those experienced during previous heart attacks, although he does not believe he is currently having one.  He discussed this with his PCP who also recommended that he be evaluated by his Cardiologist.  He reports that he has an upcoming appointment with his Cardiologist.  He continues to use Trelegy daily and carries albuterol  for as-needed use, which he uses approximately once a day.  He has a history of two heart attacks and is scheduled to see his cardiologist for further evaluation of his symptoms. He mentions being given another inhaler to use twice a day by another doctor, but he cannot recall the name of the medication.   Last OV 07/07/2024: Ruben Young is a 74 year old male with COPD who presents with worsening shortness of breath.   He experiences shortness of breath upon exertion, describing his breathing as 'a little shallow'. This worsens with activities such as walking uphill or catching a bus. He reports that his oxygen levels were recorded at 95% during mild activity.   He was seen in the  emergency department at the end of November for a dental infection, treated with Augmentin . During that visit, he underwent a CT scan and chest x-ray.   He uses albuterol  approximately three times a day, more frequently than usual, and Trelegy once or twice daily. Despite this, he does not notice significant improvement in his symptoms. He has a persistent cough with mucus production, though he is unsure of the color of the mucus.   No chest pain, fever, or chills. He mentions having an aneurysm in his groin that causes occasional discomfort. He has noticed a decline in his ability to perform household tasks, which now take significantly longer due to his need to rest frequently.   He is concerned about his breathing feeling similar to when he had a heart attack and has inquired about checking his arteries for blockages.  He was referred to Cardiology for these concerns at his ER visit at the end of November.  He has not scheduled this follow up yet.  TEST/EVENTS :  PFTs 11/2022 moderate airway obstruction ratio 64, post BD FEV1 58%, no BD response, DLCO 13.4/54% normal TLC   CTA chest 06/2022 bilateral upper and lower lobe segmental and subsegmental pulmonary emboli, no RV strain Venous duplex left popliteal DVT   12/2 Echo 50-55%, no WMA, normal RVF  NPSG 01/2018 neg, desat to 89%   06/23/2024 Chest/Abdomen/Pelvis CT: IMPRESSION: 1. No evidence of acute aortic syndrome or pulmonary embolism. 2. Right common iliac artery aneurysm measuring 2.3 cm and left common iliac artery ectasia measuring 1.8 cm,  with extensive plaque and mild bilateral luminal narrowing; recommend vascular specialist follow-up given bilateral iliac aneurysmal disease.  Allergies[1]  Immunization History  Administered Date(s) Administered   INFLUENZA, HIGH DOSE SEASONAL PF 05/31/2018, 03/31/2022   Moderna SARS-COV2 Booster Vaccination 06/10/2020   Moderna Sars-Covid-2 Vaccination 10/11/2019, 11/12/2019    Past  Medical History:  Diagnosis Date   AAA (abdominal aortic aneurysm)    DDD (degenerative disc disease), lumbar    DVT (deep venous thrombosis) (HCC)    Dyspnea    Hyperlipidemia    Hypertension    MI (myocardial infarction) (HCC)    2018   Old myocardial infarction    Pulmonary emboli (HCC)    Tobacco use disorder     Tobacco History: Tobacco Use History[2] Ready to quit: Not Answered Counseling given: Not Answered   Outpatient Medications Prior to Visit  Medication Sig Dispense Refill   amLODipine  (NORVASC ) 10 MG tablet TAKE 1 TABLET(10 MG) BY MOUTH DAILY 90 tablet 3   amoxicillin -clavulanate (AUGMENTIN ) 875-125 MG tablet Take 1 tablet by mouth every 12 (twelve) hours. 14 tablet 0   Ascorbic Acid (VITAMIN C PO) Take 1,000 mg by mouth every other day.     aspirin  EC 81 MG tablet Take 81 mg by mouth daily.     atorvastatin  (LIPITOR) 40 MG tablet TAKE 1 TABLET BY MOUTH DAILY AT 6 PM 90 tablet 1   azithromycin  (ZITHROMAX ) 250 MG tablet 2 tabs today, then 1 tab daily on days 2-5 6 tablet 0   B Complex Vitamins (B COMPLEX PO) Take 1 capsule by mouth every other day.     CALCIUM  PO Take 1 tablet by mouth every other day.     carvedilol  (COREG ) 6.25 MG tablet TAKE 1 TABLET(6.25 MG) BY MOUTH TWICE DAILY WITH A MEAL 60 tablet 1   Cholecalciferol (VITAMIN D3) 25 MCG (1000 UT) CAPS Take 1,000 Units by mouth daily.      ELIQUIS  5 MG TABS tablet TAKE 1 TABLET(5 MG) BY MOUTH TWICE DAILY 60 tablet 2   ferrous sulfate 325 (65 FE) MG EC tablet Take 325 mg by mouth every other day.     Fluticasone-Umeclidin-Vilant (TRELEGY ELLIPTA ) 100-62.5-25 MCG/ACT AEPB Inhale 100 mcg into the lungs daily.     GINSENG PO Take 1 capsule by mouth every other day.     hydrochlorothiazide (HYDRODIURIL) 25 MG tablet Take 25 mg by mouth daily.     HYDROcodone -acetaminophen  (NORCO/VICODIN) 5-325 MG tablet Take 1 tablet by mouth every 6 (six) hours as needed for severe pain (pain score 7-10). 12 tablet 0    hydrocortisone cream 1 % Apply 1 Application topically daily as needed for itching.     Multiple Vitamin (MULTIVITAMIN WITH MINERALS) TABS tablet Take 1 tablet by mouth daily.     nitroGLYCERIN  (NITROSTAT ) 0.4 MG SL tablet Place 1 tablet (0.4 mg total) under the tongue every 5 (five) minutes x 3 doses as needed for chest pain. For chest pains 25 tablet 0   Omega-3 Fatty Acids (FISH OIL) 1000 MG CAPS Take 1,000 mg by mouth every other day.     POTASSIUM PO Take 1 tablet by mouth every other day.     sulfamethoxazole-trimethoprim (BACTRIM DS) 800-160 MG tablet Take 1 tablet by mouth 2 (two) times daily.     vitamin E 400 UNIT capsule Take 400 Units by mouth every other day.     albuterol  (VENTOLIN  HFA) 108 (90 Base) MCG/ACT inhaler Inhale 2 puffs into the lungs every 4 (four)  hours as needed for wheezing or shortness of breath. 6.7 g 1   Fluticasone-Umeclidin-Vilant (TRELEGY ELLIPTA ) 100-62.5-25 MCG/ACT AEPB INHALE 1 PUFF INTO THE LUNGS DAILY 60 each 3   No facility-administered medications prior to visit.     Review of Systems: as per hpi  Constitutional:   No  weight loss, night sweats,  Fevers, chills, fatigue, or  lassitude.  HEENT:   No headaches,  Difficulty swallowing,  Tooth/dental problems, or  Sore throat,                No sneezing, itching, ear ache, nasal congestion, post nasal drip,   CV:  No chest pain,  Orthopnea, PND, swelling in lower extremities, anasarca, dizziness, palpitations, syncope.   GI  No heartburn, indigestion, abdominal pain, nausea, vomiting, diarrhea, change in bowel habits, loss of appetite, bloody stools.   Resp: No shortness of breath with exertion or at rest.  No excess mucus, no productive cough,  No non-productive cough,  No coughing up of blood.  No change in color of mucus.  No wheezing.  No chest wall deformity  Skin: no rash or lesions.  GU: no dysuria, change in color of urine, no urgency or frequency.  No flank pain, no hematuria   MS:  No  joint pain or swelling.  No decreased range of motion.  No back pain.    Physical Exam  BP (!) 143/78   Pulse 64   Ht 5' 8 (1.727 m)   Wt 177 lb (80.3 kg)   SpO2 93%   BMI 26.91 kg/m   GEN: A/Ox3; pleasant , NAD, well nourished.  Speaks in full sentences; no distress noted.   HEENT:  [place]/AT,  EACs-clear, TMs-wnl, NOSE-clear, THROAT-clear, no lesions, no postnasal drip or exudate noted.   NECK:  Supple w/ fair ROM; no JVD; normal carotid impulses w/o bruits; no thyromegaly or nodules palpated; no lymphadenopathy.    RESP  Clear  P & A; w/o, wheezes/ rales/ or rhonchi. no accessory muscle use, no dullness to percussion  CARD:  RRR, no m/r/g, no peripheral edema, pulses intact, no cyanosis or clubbing.  GI:   Soft & nt; nml bowel sounds; no organomegaly or masses detected.   Musco: Warm bil, no deformities or joint swelling noted.   Neuro: alert, no focal deficits noted.    Skin: Warm, no lesions or rashes    Lab Results:  CBC    Component Value Date/Time   WBC 9.4 06/23/2024 1556   RBC 4.83 06/23/2024 1556   HGB 14.3 06/23/2024 1556   HCT 43.6 06/23/2024 1556   PLT 249 06/23/2024 1556   MCV 90.3 06/23/2024 1556   MCH 29.6 06/23/2024 1556   MCHC 32.8 06/23/2024 1556   RDW 14.6 06/23/2024 1556   LYMPHSABS 3.3 06/23/2024 1556   MONOABS 1.0 06/23/2024 1556   EOSABS 0.3 06/23/2024 1556   BASOSABS 0.0 06/23/2024 1556    BMET    Component Value Date/Time   NA 142 06/23/2024 1556   K 4.0 06/23/2024 1556   CL 107 06/23/2024 1556   CO2 27 06/23/2024 1556   GLUCOSE 101 (H) 06/23/2024 1556   BUN 12 06/23/2024 1556   CREATININE 0.86 06/23/2024 1556   CALCIUM  9.2 06/23/2024 1556   GFRNONAA >60 06/23/2024 1556   GFRAA >60 03/28/2020 1309    BNP    Component Value Date/Time   BNP 34.0 06/30/2022 1514    ProBNP    Component Value Date/Time   PROBNP <50.0  06/23/2024 1556    Imaging: No results found.  Administration History     None           Latest Ref Rng & Units 12/05/2022    1:04 PM  PFT Results  FVC-Pre L 2.52   FVC-Predicted Pre % 60   FVC-Post L 2.71   FVC-Predicted Post % 65   Pre FEV1/FVC % % 64   Post FEV1/FCV % % 65   FEV1-Pre L 1.60   FEV1-Predicted Pre % 53   FEV1-Post L 1.76   DLCO uncorrected ml/min/mmHg 13.39   DLCO UNC% % 54   DLCO corrected ml/min/mmHg 13.39   DLCO COR %Predicted % 54   DLVA Predicted % 71   TLC L 5.75   TLC % Predicted % 84   RV % Predicted % 118     No results found for: NITRICOXIDE   Assessment & Plan:   Assessment & Plan Centrilobular emphysema (HCC)  Tobacco abuse  Shortness of breath  Assessment and Plan Assessment & Plan Chronic obstructive pulmonary disease with centrilobular emphysema, status post recent exacerbation COPD with centrilobular emphysema, recent exacerbation. Dyspnea on exertion noted. Differential includes cardiac causes due to history of myocardial infarctions and coronary artery disease. Cardiologist appointment scheduled. - Continue Trelegy daily; refills sent to pharmacy - Use albuterol  as needed for dyspnea. - Attend cardiologist appointment for further evaluation of cardiac causes of dyspnea   Return in about 6 months (around 02/08/2025), or w/ Dr. Alva, for COPD.  Candis Dandy, PA-C 08/11/2024      [1] No Known Allergies [2]  Social History Tobacco Use  Smoking Status Every Day   Current packs/day: 0.50   Average packs/day: 1 pack/day for 59.4 years (58.6 ttl pk-yrs)   Types: Cigarettes   Start date: 1966   Last attempt to quit: 05/2022   Passive exposure: Past  Smokeless Tobacco Never   "

## 2024-08-11 NOTE — Patient Instructions (Addendum)
 Continue Trelegy once daily.  Continue Albuterol  2 puffs every 4-6 hours as needed.  Follow up in 6 months with our office; Dr. Jude

## 2024-08-26 ENCOUNTER — Ambulatory Visit

## 2024-08-26 ENCOUNTER — Other Ambulatory Visit

## 2024-08-27 ENCOUNTER — Other Ambulatory Visit: Payer: Self-pay | Admitting: Cardiology

## 2024-08-28 NOTE — Progress Notes (Signed)
 " Cardiology Office Note:  .   Date:  08/29/2024  ID:  Ruben Young, DOB 1951-04-16, MRN [id number] PCP: Leigh Lung, MD  [facility] Providers Cardiologist:  Oneil Parchment, MD {  History of Present Illness: .   Ruben Young is a 74 y.o. male with history of CAD status post STEMI 2014 with BMS to the LAD, ICM, left common iliac artery aneurysm followed by vascular surgery, bilateral PE 2023, COPD followed by pulmonology, hypertension, hyperlipidemia.     CAD Per prior notes.  PCI to the RCA in the past.  2014 BMS to LAD with ICM, EF 40 to 45%. 06/2022 echocardiogram with recovered EF. 09/2022 mildly abnormal, low risk stress test.  Inferior thinning and mild ischemia in the distal inferior wall and apex; gated EF 54 with normal wall motion.  Medical therapy recommended.  Social history  Smokes around 4 cigarettes/month Lives at home alone  History of PE Diagnosed 2023 after reporting severe shortness of breath with minimal activity.  Unprovoked probably.     Patient with history of PCI to his RCA and with STEMI in 2014 with BMS to his LAD.  Last seen 08/2021 without any significant changes.  Has reassuring stress testing 2024.  Also has bilateral common iliac artery aneurysm that seems to be stable around 2.4 cm. Elective repair considered around 3.5.  He has seen pulmonology who reports treatment failure with Trelegy and concerns of cardiac related dyspnea.  They recommended follow-up with us .  Today patient presents for follow-up.  Overdue.  Patient reports DOE with minimal activity.  Even tying his shoes will make him out of breath.  Does not require oxygen though.  At rest he is in no acute distress.  Reports symptoms have been persistent and ongoing, worsened over the course of 1 year.  However he reports shortness of breath really started when he had his diagnosis of pulmonary embolism.  Denied any orthopnea, resting shortness of breath or peripheral edema.  Prior MIs  he had complaints of chest pain but has not had any further reoccurrences of this.  Continues to smoke but minimally.  Reports his inhalers really do not provide him much benefit.  ROS: Denies: Chest pain, shortness of breath, orthopnea, peripheral edema, palpitations, syncope, decreased exercise capacity, fatigue, dizziness.   Studies Reviewed: .         Risk Assessment/Calculations:       STOP-Bang Score:  4      Physical Exam:   VS:  BP 118/72   Pulse 71   Ht 5' 8 (1.727 m)   Wt 171 lb (77.6 kg)   SpO2 94%   BMI 26.00 kg/m    Wt Readings from Last 3 Encounters:  08/29/24 171 lb (77.6 kg)  08/11/24 177 lb (80.3 kg)  07/07/24 176 lb (79.8 kg)    GEN: Well nourished, well developed in no acute distress NECK: No JVD; No carotid bruits CARDIAC: RRR, no murmurs, rubs, gallops RESPIRATORY:  Clear to auscultation without rales, wheezing or rhonchi  ABDOMEN: Soft, non-tender, non-distended EXTREMITIES:  No edema; No deformity   ASSESSMENT AND PLAN: .    DOE Difficult to define etiology of his DOE.  Reports ongoing symptoms since his pulmonary embolism in 2023 but worsened symptoms within the past 1 year and gets short of breath easily with minimal activity.  DOE was not a clear anginal equivalent based off his symptoms from prior MI.  Does have known COPD followed by pulmonology without  any improvement with treatment of inhalers. Will obtain echocardiogram and proBNP, CHF less likely but echocardiogram will be able to help evaluate for pulmonary hypertension and valvular disease. I think given his mixed presentation and mildly abnormal stress test in 2024 not unreasonable to proceed with left and right heart catheterization if echocardiogram results are not compelling.  Will discuss with MD.  CAD HLD -2014 STEMI BMS to LAD.  RCA stent okay. -09/2022 stress test mildly abnormal, low risk stress test.  Inferior thinning and mild ischemia in the distal inferior wall and apex; gated EF  54 with normal wall motion.  Medical therapy recommended. As above, not clear that his DOE is anginal equivalent.  Has not reported any chest pain though. Currently he is on aspirin  and Eliquis .  I will check with MD to see if he still wants him on aspirin , does have previous bare-metal stents though. Continue atorvastatin  40 mg daily, carvedilol  6.25 mg twice daily.  LDL 65 10/2023  ICM with recovered EF -Reduced EF remotely 2014. -06/2022 EF 50 to 55%.  No valve disease. Appears euvolemic on exam.  Do not think this is contributing to his symptoms but will check echocardiogram and proBNP as above.  Will titrate GDMT if indicated. Continue carvedilol  6.25 mg twice daily  Bilateral iliac artery aneurysm 07/2023  -Right common iliac artery aneurysm measures 2.6x2.5cm, essentially unchanged compared to prior exam at 2.2x2.4cm.  -Left common iliac artery aneurysm measures 2.0x2.0cm, essentially unchanged compared to prior exam at 2.2x1.9cm.  -The largest aortic diameter has decreased compared to prior exam. Previous  diameter measurement was 2.5 cm obtained on 06/01/22.   Needs to follow-up with vascular surgery but has repeat ultrasound pending 10/21/2024.  History of nicotine dependence  Continues to smoke about 4 cigarettes a month.  Encouraged cessation.  COPD Followed by pulmonology.  See above.  OSA Ordering split night sleep study.  STOP-BANG at least 4.  History of PE Continue Eliquis  5 mg twice daily.  Hypertension Blood pressure is well-controlled on current therapy.  Continue amlodipine  10 mg, HCTZ 25 mg daily, carvedilol .   Dispo: 35-month follow-up with Dr. Jeffrie  Signed, Thom LITTIE Sluder, PA-C  "

## 2024-08-29 ENCOUNTER — Ambulatory Visit: Attending: Cardiology | Admitting: Cardiology

## 2024-08-29 ENCOUNTER — Encounter: Payer: Self-pay | Admitting: Cardiology

## 2024-08-29 VITALS — BP 118/72 | HR 71 | Ht 68.0 in | Wt 171.0 lb

## 2024-08-29 DIAGNOSIS — R0602 Shortness of breath: Secondary | ICD-10-CM

## 2024-08-29 NOTE — Patient Instructions (Signed)
" °  Medication Instructions:  No medication changes were made during today's visit.  *If you need a refill on your cardiac medications before your next appointment, please call your pharmacy*  Lab Work: PRO BNP If you have labs (blood work) drawn today and your tests are completely normal, you will receive your results only by: MyChart Message (if you have MyChart) OR A paper copy in the mail If you have any lab test that is abnormal or we need to change your treatment, we will call you to review the results.  Testing/Procedures: Your physician has requested that you have an echocardiogram. Echocardiography is a painless test that uses sound waves to create images of your heart. It provides your doctor with information about the size and shape of your heart and how well your hearts chambers and valves are working. This procedure takes approximately one hour. There are no restrictions for this procedure.   Please do NOT wear cologne, perfume, aftershave, or lotions (deodorant is allowed).   Please arrive 15 minutes prior to your appointment time.      Your physician has recommended that you have a sleep study. This test records several body functions during sleep, including: brain activity, eye movement, oxygen and carbon dioxide blood levels, heart rate and rhythm, breathing rate and rhythm, the flow of air through your mouth and nose, snoring, body muscle movements, and chest and belly movement.   Follow-Up: At [facility], you and your health needs are our priority.  As part of our continuing mission to provide you with exceptional heart care, our providers are all part of one team.  This team includes your primary Cardiologist (physician) and Advanced Practice Providers or APPs (Physician Assistants and Nurse Practitioners) who all work together to provide you with the care you need, when you need it.  Your next appointment:   2 month(s)  Provider:   Oneil Parchment, MD     We recommend signing up for the patient portal called MyChart.  Sign up information is provided on this After Visit Summary.  MyChart is used to connect with patients for Virtual Visits (Telemedicine).  Patients are able to view lab/test results, encounter notes, upcoming appointments, etc.  Non-urgent messages can be sent to your provider as well.   To learn more about what you can do with MyChart, go to [email or web address].   Other Instructions         "

## 2024-08-30 LAB — PRO B NATRIURETIC PEPTIDE: NT-Pro BNP: 55 pg/mL (ref 0–486)

## 2024-09-01 ENCOUNTER — Ambulatory Visit: Payer: Self-pay | Admitting: Cardiology

## 2024-10-03 ENCOUNTER — Other Ambulatory Visit (HOSPITAL_COMMUNITY)

## 2024-10-21 ENCOUNTER — Ambulatory Visit

## 2024-10-21 ENCOUNTER — Other Ambulatory Visit
